# Patient Record
Sex: Female | Born: 1960
Health system: Southern US, Community
[De-identification: ages and names within clinical notes are randomized; demographics above are authoritative.]

## PROBLEM LIST (undated history)

## (undated) DIAGNOSIS — G4700 Insomnia, unspecified: Secondary | ICD-10-CM

## (undated) DIAGNOSIS — I493 Ventricular premature depolarization: Secondary | ICD-10-CM

## (undated) DIAGNOSIS — I8390 Asymptomatic varicose veins of unspecified lower extremity: Secondary | ICD-10-CM

## (undated) DIAGNOSIS — T8859XA Other complications of anesthesia, initial encounter: Secondary | ICD-10-CM

## (undated) DIAGNOSIS — I10 Essential (primary) hypertension: Secondary | ICD-10-CM

## (undated) DIAGNOSIS — F329 Major depressive disorder, single episode, unspecified: Secondary | ICD-10-CM

## (undated) DIAGNOSIS — K589 Irritable bowel syndrome without diarrhea: Secondary | ICD-10-CM

## (undated) DIAGNOSIS — F419 Anxiety disorder, unspecified: Secondary | ICD-10-CM

## (undated) DIAGNOSIS — T7840XA Allergy, unspecified, initial encounter: Secondary | ICD-10-CM

## (undated) DIAGNOSIS — B019 Varicella without complication: Secondary | ICD-10-CM

## (undated) DIAGNOSIS — R112 Nausea with vomiting, unspecified: Secondary | ICD-10-CM

## (undated) DIAGNOSIS — Z9889 Other specified postprocedural states: Secondary | ICD-10-CM

## (undated) DIAGNOSIS — F32A Depression, unspecified: Secondary | ICD-10-CM

## (undated) DIAGNOSIS — Z87442 Personal history of urinary calculi: Secondary | ICD-10-CM

## (undated) DIAGNOSIS — I499 Cardiac arrhythmia, unspecified: Secondary | ICD-10-CM

## (undated) DIAGNOSIS — T4145XA Adverse effect of unspecified anesthetic, initial encounter: Secondary | ICD-10-CM

## (undated) DIAGNOSIS — E782 Mixed hyperlipidemia: Secondary | ICD-10-CM

## (undated) DIAGNOSIS — N852 Hypertrophy of uterus: Secondary | ICD-10-CM

## (undated) DIAGNOSIS — N951 Menopausal and female climacteric states: Secondary | ICD-10-CM

## (undated) DIAGNOSIS — R002 Palpitations: Secondary | ICD-10-CM

## (undated) DIAGNOSIS — G629 Polyneuropathy, unspecified: Secondary | ICD-10-CM

## (undated) HISTORY — DX: Hypertrophy of uterus: N85.2

## (undated) HISTORY — DX: Menopausal and female climacteric states: N95.1

## (undated) HISTORY — DX: Irritable bowel syndrome, unspecified: K58.9

## (undated) HISTORY — DX: Essential (primary) hypertension: I10

## (undated) HISTORY — DX: Allergy, unspecified, initial encounter: T78.40XA

## (undated) HISTORY — DX: Personal history of urinary calculi: Z87.442

## (undated) HISTORY — PX: TONSILLECTOMY: SUR1361

## (undated) HISTORY — DX: Palpitations: R00.2

## (undated) HISTORY — PX: OTHER SURGICAL HISTORY: SHX169

## (undated) HISTORY — PX: COLONOSCOPY: SHX174

## (undated) HISTORY — DX: Polyneuropathy, unspecified: G62.9

## (undated) HISTORY — PX: ABDOMINAL HYSTERECTOMY: SHX81

## (undated) HISTORY — PX: HERNIA REPAIR: SHX51

## (undated) HISTORY — PX: LAPAROSCOPIC TOTAL HYSTERECTOMY: SUR800

## (undated) HISTORY — PX: VARICOSE VEIN SURGERY: SHX832

## (undated) HISTORY — DX: Mixed hyperlipidemia: E78.2

## (undated) HISTORY — PX: BREAST ENHANCEMENT SURGERY: SHX7

---

## 2011-11-18 HISTORY — PX: ESOPHAGOGASTRODUODENOSCOPY: SHX1529

## 2012-02-23 ENCOUNTER — Ambulatory Visit: Payer: Self-pay | Admitting: Gastroenterology

## 2012-02-26 LAB — PATHOLOGY REPORT

## 2012-10-13 ENCOUNTER — Ambulatory Visit: Payer: Self-pay | Admitting: Physical Medicine and Rehabilitation

## 2013-10-05 ENCOUNTER — Ambulatory Visit: Payer: Self-pay | Admitting: Internal Medicine

## 2013-10-19 ENCOUNTER — Ambulatory Visit: Payer: Self-pay | Admitting: Internal Medicine

## 2014-03-10 ENCOUNTER — Emergency Department: Payer: Self-pay | Admitting: Emergency Medicine

## 2014-03-10 LAB — CBC WITH DIFFERENTIAL/PLATELET
BASOS PCT: 0.7 %
Basophil #: 0 10*3/uL (ref 0.0–0.1)
EOS ABS: 0.3 10*3/uL (ref 0.0–0.7)
EOS PCT: 5.4 %
HCT: 36.2 % (ref 35.0–47.0)
HGB: 12.3 g/dL (ref 12.0–16.0)
Lymphocyte #: 1.5 10*3/uL (ref 1.0–3.6)
Lymphocyte %: 26 %
MCH: 31.1 pg (ref 26.0–34.0)
MCHC: 34 g/dL (ref 32.0–36.0)
MCV: 92 fL (ref 80–100)
MONO ABS: 0.4 x10 3/mm (ref 0.2–0.9)
Monocyte %: 6 %
NEUTROS ABS: 3.6 10*3/uL (ref 1.4–6.5)
Neutrophil %: 61.9 %
PLATELETS: 193 10*3/uL (ref 150–440)
RBC: 3.95 10*6/uL (ref 3.80–5.20)
RDW: 12.6 % (ref 11.5–14.5)
WBC: 5.9 10*3/uL (ref 3.6–11.0)

## 2014-03-10 LAB — URINALYSIS, COMPLETE
BILIRUBIN, UR: NEGATIVE
Bacteria: NONE SEEN
Blood: NEGATIVE
Glucose,UR: NEGATIVE mg/dL (ref 0–75)
KETONE: NEGATIVE
LEUKOCYTE ESTERASE: NEGATIVE
Nitrite: NEGATIVE
Ph: 7 (ref 4.5–8.0)
Protein: NEGATIVE
RBC,UR: NONE SEEN /HPF (ref 0–5)
SPECIFIC GRAVITY: 1.019 (ref 1.003–1.030)
WBC UR: <1 /HPF (ref 0–5)

## 2014-03-10 LAB — COMPREHENSIVE METABOLIC PANEL
ALBUMIN: 3.5 g/dL (ref 3.4–5.0)
ALK PHOS: 44 U/L — AB
AST: 30 U/L (ref 15–37)
Anion Gap: 7 (ref 7–16)
BILIRUBIN TOTAL: 0.3 mg/dL (ref 0.2–1.0)
BUN: 13 mg/dL (ref 7–18)
CALCIUM: 8.6 mg/dL (ref 8.5–10.1)
CREATININE: 0.65 mg/dL (ref 0.60–1.30)
Chloride: 106 mmol/L (ref 98–107)
Co2: 26 mmol/L (ref 21–32)
EGFR (African American): >60
EGFR (Non-African Amer.): >60
Glucose: 101 mg/dL — ABNORMAL HIGH (ref 65–99)
OSMOLALITY: 278 (ref 275–301)
POTASSIUM: 3.9 mmol/L (ref 3.5–5.1)
SGPT (ALT): 21 U/L (ref 12–78)
Sodium: 139 mmol/L (ref 136–145)
Total Protein: 6.9 g/dL (ref 6.4–8.2)

## 2014-03-10 LAB — GC/CHLAMYDIA PROBE AMP

## 2014-03-10 LAB — WET PREP, GENITAL

## 2014-03-10 LAB — PREGNANCY, URINE: Pregnancy Test, Urine: NEGATIVE m[IU]/mL

## 2014-06-27 DIAGNOSIS — G4700 Insomnia, unspecified: Secondary | ICD-10-CM | POA: Insufficient documentation

## 2014-06-27 DIAGNOSIS — I493 Ventricular premature depolarization: Secondary | ICD-10-CM | POA: Insufficient documentation

## 2014-06-27 DIAGNOSIS — M5116 Intervertebral disc disorders with radiculopathy, lumbar region: Secondary | ICD-10-CM | POA: Insufficient documentation

## 2014-06-27 DIAGNOSIS — I8390 Asymptomatic varicose veins of unspecified lower extremity: Secondary | ICD-10-CM | POA: Insufficient documentation

## 2014-06-27 DIAGNOSIS — K581 Irritable bowel syndrome with constipation: Secondary | ICD-10-CM | POA: Insufficient documentation

## 2014-08-15 ENCOUNTER — Encounter (INDEPENDENT_AMBULATORY_CARE_PROVIDER_SITE_OTHER): Payer: Self-pay

## 2014-08-15 ENCOUNTER — Ambulatory Visit (INDEPENDENT_AMBULATORY_CARE_PROVIDER_SITE_OTHER): Payer: 59 | Admitting: Cardiovascular Disease

## 2014-08-15 ENCOUNTER — Encounter: Payer: Self-pay | Admitting: Cardiovascular Disease

## 2014-08-15 VITALS — BP 132/88 | HR 83 | Ht 62.0 in | Wt 114.5 lb

## 2014-08-15 DIAGNOSIS — R0789 Other chest pain: Secondary | ICD-10-CM | POA: Insufficient documentation

## 2014-08-15 DIAGNOSIS — R079 Chest pain, unspecified: Secondary | ICD-10-CM

## 2014-08-15 DIAGNOSIS — F411 Generalized anxiety disorder: Secondary | ICD-10-CM

## 2014-08-15 DIAGNOSIS — F419 Anxiety disorder, unspecified: Secondary | ICD-10-CM

## 2014-08-15 DIAGNOSIS — I10 Essential (primary) hypertension: Secondary | ICD-10-CM | POA: Insufficient documentation

## 2014-08-15 DIAGNOSIS — R002 Palpitations: Secondary | ICD-10-CM

## 2014-08-15 DIAGNOSIS — R0602 Shortness of breath: Secondary | ICD-10-CM

## 2014-08-15 DIAGNOSIS — Z8739 Personal history of other diseases of the musculoskeletal system and connective tissue: Secondary | ICD-10-CM

## 2014-08-15 DIAGNOSIS — Z87898 Personal history of other specified conditions: Secondary | ICD-10-CM

## 2014-08-15 MED ORDER — METOPROLOL SUCCINATE ER 50 MG PO TB24: 50.0000 mg | ORAL_TABLET | Freq: Every day | ORAL | Status: DC

## 2014-08-15 NOTE — Assessment & Plan Note (Signed)
Palpitations likely APCs or PVCs secondary to stress. Recommend she increase her metoprolol to 50 mg daily as tolerated

## 2014-08-15 NOTE — Assessment & Plan Note (Signed)
Atypical chest pain, 2 episodes at rest, associated with anxiety. We have offered a routine treadmill study. She will try to restart her walking and monitor her symptoms for now. We have recommended that she call us for recurrent symptoms. She does not want nitroglycerin. We did discuss the possibility of coronary spasm or GI issues

## 2014-08-15 NOTE — Progress Notes (Signed)
Patient ID: Carla Green, female    DOB: 02-Sep-1961, 53 y.o.   MRN: [id number]  HPI Comments: [name] is a very pleasant 53 year old woman, patient of Dr. Army Melia, who presents for evaluation of hypertension, chest pain.  She reports having a long history of burning in her legs. Her symptoms seem to flareup more when she walks. She reports after a busy day at work, she is more symptomatic. She has tried Neurontin with no benefit.  She does have significant stressors over the past several months. Mother passed away in 12/02/13. She helps to take care of her elderly father. Also has in-laws that she helps to take care of. She has some marital issues, also other stress at home, possibly children. This has caused significant anxiety for her She has started working night shifts for the past 2-3 months. Since then she has had worsening episodes of palpitations. She has noticed periodic elevation of her blood pressure, sometimes up to 160, commonly at nighttime  She's had 2 episodes of back pain lasting 1 minute or less. Chest pain radiated through to her back. One episode occurred at rest while in church. She was called on to answer a question and she thinks this could have been anxiety. 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 Denies having any chest pain with exertion otherwise is very active. She use to walk on a regular basis, has not been doing so in several months as she is very busy.  She has been taking low-dose metoprolol for symptoms of palpitations. No significant relief on low-dose. She did increase the dose up to 50 mg daily and this seemed to help her symptoms  EKG shows normal sinus rhythm with rate 83 beats per minute, no significant ST or T wave changes  Previous stress echo at [facility] March 2011 which was normal. She exercised for 9 minutes, peak heart rate 169 beats per minute, normal  echo at rest and stress   Outpatient Encounter Prescriptions as of 08/15/2014  Medication Sig  . ALPRAZolam (XANAX) 0.5 MG tablet Take 0.25 mg by mouth at bedtime as needed.   . metoprolol succinate (TOPROL-XL) 50 MG 24 hr tablet Take 1 tablet (50 mg total) by mouth daily.  . valACYclovir (VALTREX) 500 MG tablet Take 500 mg by mouth as needed.     Review of Systems  Constitutional: Negative.   HENT: Negative.   Eyes: Negative.   Respiratory: Negative.   Cardiovascular: Negative.   Gastrointestinal: Negative.   Endocrine: Negative.   Musculoskeletal: Negative.   Skin: Negative.   Allergic/Immunologic: Negative.   Neurological: Negative.   Hematological: Negative.   Psychiatric/Behavioral: Negative.   All other systems reviewed and are negative.   BP 132/88  Pulse 83  Ht 5\' 2"  (1.575 m)  Wt 114 lb 8 oz (51.937 kg)  BMI 20.94 kg/m2  Physical Exam  Nursing note and vitals reviewed. Constitutional: She is oriented to person, place, and time. She appears well-developed and well-nourished.  HENT:  Head: Normocephalic.  Nose: Nose normal.  Mouth/Throat: Oropharynx is clear and moist.  Eyes: Conjunctivae are normal. Pupils are equal, round, and reactive to light.  Neck: Normal range of motion. Neck supple. No JVD present.  Cardiovascular: Normal rate, regular rhythm, S1 normal, S2 normal, normal heart sounds and intact distal pulses.  Exam reveals no gallop and no friction rub.   No murmur  heard. Pulmonary/Chest: Effort normal and breath sounds normal. No respiratory distress. She has no wheezes. She has no rales. She exhibits no tenderness.  Abdominal: Soft. Bowel sounds are normal. She exhibits no distension. There is no tenderness.  Musculoskeletal: Normal range of motion. She exhibits no edema and no tenderness.  Lymphadenopathy:    She has no cervical adenopathy.  Neurological: She is alert and oriented to person, place, and time. Coordination normal.  Skin: Skin is warm  and dry. No rash noted. No erythema.  Psychiatric: She has a normal mood and affect. Her behavior is normal. Judgment and thought content normal.    Assessment and Plan

## 2014-08-15 NOTE — Patient Instructions (Addendum)
Please increase the metoprolol as needed up to 50 mg a day for palpitations  Please call if you have more chest pain  Please call us if you have new issues that need to be addressed before your next appt.

## 2014-08-15 NOTE — Assessment & Plan Note (Addendum)
Etiology is not clear. Based on clinical exam, she has good extremity pulses, no signs of chronic venous insufficiency. Symptoms likely from neuropathy or nerve related. Suggested she talk with primary care about alternate medications. Perhaps Cymbalta or lyrica  might work

## 2014-08-15 NOTE — Assessment & Plan Note (Signed)
By all accounts, she has significant stress at home as detailed. I suspect this is causing her blood pressure to climb, heart rate to act more erratically with symptomatic palpitations. Again encouraged her to increase the metoprolol

## 2014-08-15 NOTE — Assessment & Plan Note (Signed)
Blood pressure well controlled on today's visit. We have recommended she could increase her metoprolol to 50 mg daily. This would likely help with blood pressure and her palpitations. I suspect her symptoms are secondary to anxiety/stress. Medication could be weaned as tolerated

## 2014-12-25 ENCOUNTER — Telehealth: Payer: Self-pay | Admitting: *Deleted

## 2014-12-25 NOTE — Telephone Encounter (Signed)
Pt states that palliations  are bad that its not letting patient sleep,  About three weeks this has been going on, and this is after she started the medications. Seemed like it worked for a little while.  If she's moving around she's fine, it is just when she try's to lay down, she is more restless at night.   Please advise.

## 2014-12-25 NOTE — Telephone Encounter (Signed)
Spoke w/ pt.  She reports that her episodes of palpitations have been increasing and she would like to be evaluated. Offered pt appt to see Christell Faith, PA this afternoon, but she declines, as she would prefer an appt after 4:00 or on Friday am.  Pt sched to see Dr. Rockey Situ on 12/29/14 @ 9:15. Asked her to call back w/ any further questions or concerns before that time.

## 2014-12-28 DIAGNOSIS — R002 Palpitations: Secondary | ICD-10-CM | POA: Diagnosis not present

## 2014-12-29 ENCOUNTER — Ambulatory Visit (INDEPENDENT_AMBULATORY_CARE_PROVIDER_SITE_OTHER): Payer: 59 | Admitting: Cardiovascular Disease

## 2014-12-29 ENCOUNTER — Encounter: Payer: Self-pay | Admitting: Cardiovascular Disease

## 2014-12-29 VITALS — BP 122/68 | HR 72 | Ht 63.0 in | Wt 120.0 lb

## 2014-12-29 DIAGNOSIS — F419 Anxiety disorder, unspecified: Secondary | ICD-10-CM

## 2014-12-29 DIAGNOSIS — R079 Chest pain, unspecified: Secondary | ICD-10-CM

## 2014-12-29 DIAGNOSIS — R002 Palpitations: Secondary | ICD-10-CM

## 2014-12-29 DIAGNOSIS — I10 Essential (primary) hypertension: Secondary | ICD-10-CM

## 2014-12-29 NOTE — Progress Notes (Signed)
Patient ID: Carla Green, female    DOB: July 03, 1961, 54 y.o.   MRN: [id number]  HPI Comments: Carla Green is a very pleasant 54 year old woman, patient of Dr. Army Melia, who presents for evaluation of palpitations.  In follow-up today, she reports that she continues to have problems with Anxiety. She thinks that she needs estrogen supplement and that her symptoms are from menopause Taking metoprolol daily 2 to 3 weeks consistently On zoloft 2 to 3 weeks. Feels better Insomnia still a problem. Palpitations more at nighttime. She is listening with a stethoscope She is interested in wearing a monitor, concerned about atrial fibrillation  EKG on today's visit shows normal sinus rhythm with rate 72 bpm, no significant ST or T-wave changes Lab work from primary care reviewed hemoglobin A1c 5.2, creatinine 0.73, hematocrit 39, TSH 1.0, total cholesterol 245, LDL 142 EKG from primary care August 2015 showing no ectopy otherwise normal EKG  Other past medical history  long history of burning in her legs. Her symptoms seem to flareup more when she walks. She reports after a busy day at work, she is more symptomatic. She has tried Neurontin with no benefit.  She does have significant stressors over the past several months. Mother passed away in January 02, 2014. She helps to take care of her elderly father. Also has in-laws that she helps to take care of. She has some marital issues, also other stress at home, possibly children. This has caused significant anxiety for her She has started working night shifts   Previous stress echo at [facility] March 2011 which was normal. She exercised for 9 minutes, peak heart rate 169 beats per minute, normal echo at rest and stress   Allergies  Allergen Reactions  . Codeine Sulfate Itching    Outpatient Encounter Prescriptions as of 12/29/2014  Medication Sig  . ALPRAZolam (XANAX) 0.5 MG tablet Take 0.25 mg by mouth at bedtime as needed.   Marland Kitchen ibuprofen  (ADVIL,MOTRIN) 800 MG tablet Take 800 mg by mouth every 6 (six) hours as needed.   . metoprolol succinate (TOPROL-XL) 50 MG 24 hr tablet Take 1 tablet (50 mg total) by mouth daily.  . valACYclovir (VALTREX) 500 MG tablet Take 500 mg by mouth as needed.   . sertraline (ZOLOFT) 50 MG tablet Take 50 mg by mouth daily.    Past Medical History  Diagnosis Date  . IBS (irritable bowel syndrome)   . Menopausal state   . Neuropathy   . Mixed hyperlipidemia   . History of kidney stones   . Palpitations     History reviewed. No pertinent past surgical history.  Social History  reports that she has never smoked. She does not have any smokeless tobacco history on file. She reports that she does not drink alcohol or use illicit drugs.  Family History family history includes Heart attack in her father; Heart attack (age of onset: 22) in her paternal grandmother; Heart attack (age of onset: 35) in her paternal uncle; Heart disease in her father and paternal uncle.   Review of Systems  Constitutional: Negative.   Respiratory: Negative.   Cardiovascular: Positive for palpitations.  Gastrointestinal: Negative.   Musculoskeletal: Negative.   Skin: Negative.   Neurological: Negative.   Hematological: Negative.   Psychiatric/Behavioral: Negative.   All other systems reviewed and are negative.   BP 122/68 mmHg  Pulse 72  Ht 5\' 3"  (1.6 m)  Wt 120 lb (54.432 kg)  BMI 21.26 kg/m2  Physical Exam  Constitutional:  She is oriented to person, place, and time. She appears well-developed and well-nourished.  HENT:  Head: Normocephalic.  Nose: Nose normal.  Mouth/Throat: Oropharynx is clear and moist.  Eyes: Conjunctivae are normal. Pupils are equal, round, and reactive to light.  Neck: Normal range of motion. Neck supple. No JVD present.  Cardiovascular: Normal rate, regular rhythm, S1 normal, S2 normal, normal heart sounds and intact distal pulses.  Exam reveals no gallop and no friction rub.    No murmur heard. Pulmonary/Chest: Effort normal and breath sounds normal. No respiratory distress. She has no wheezes. She has no rales. She exhibits no tenderness.  Abdominal: Soft. Bowel sounds are normal. She exhibits no distension. There is no tenderness.  Musculoskeletal: Normal range of motion. She exhibits no edema or tenderness.  Lymphadenopathy:    She has no cervical adenopathy.  Neurological: She is alert and oriented to person, place, and time. Coordination normal.  Skin: Skin is warm and dry. No rash noted. No erythema.  Psychiatric: She has a normal mood and affect. Her behavior is normal. Judgment and thought content normal.    Assessment and Plan  Nursing note and vitals reviewed.

## 2014-12-29 NOTE — Patient Instructions (Addendum)
You are doing well. No medication changes were made.  We will order a 48 hr monitor for palpitations  Please call us if you have new issues that need to be addressed before your next appt.  Your physician wants you to follow-up in: 6 months.  You will receive a reminder letter in the mail two months in advance. If you don't receive a letter, please call our office to schedule the follow-up appointment.

## 2014-12-29 NOTE — Assessment & Plan Note (Signed)
Anxiety improving on her Zoloft. Still with symptoms

## 2014-12-29 NOTE — Assessment & Plan Note (Signed)
We have suggested that she stay on her metoprolol succinate 50 mg in the evenings. This will also help with palpitations

## 2014-12-29 NOTE — Assessment & Plan Note (Signed)
I suspect she is having APCs or PVCs. Holter monitor has been ordered. She is concerned about other rhythm such as atrial fibrillation. Less likely given her description

## 2015-01-04 ENCOUNTER — Telehealth: Payer: Self-pay

## 2015-01-04 NOTE — Telephone Encounter (Signed)
Have not received results yet.

## 2015-01-04 NOTE — Telephone Encounter (Signed)
Pt would like holter results.

## 2015-01-09 ENCOUNTER — Telehealth: Payer: Self-pay | Admitting: *Deleted

## 2015-01-09 NOTE — Telephone Encounter (Signed)
Please call patient with heart monitor results. Thanks

## 2015-01-09 NOTE — Telephone Encounter (Signed)
Results are still pending from [place].

## 2015-01-18 ENCOUNTER — Ambulatory Visit: Payer: Self-pay | Admitting: Internal Medicine

## 2015-01-18 ENCOUNTER — Telehealth: Payer: Self-pay

## 2015-01-18 NOTE — Telephone Encounter (Signed)
Pt would like holter results.

## 2015-01-22 NOTE — Telephone Encounter (Signed)
Reviewed results w/ pt.  She verbalizes relief that she has not been having afib and will call back w/ any further questions or concerns.

## 2015-01-22 NOTE — Telephone Encounter (Signed)
Pt calling about results

## 2015-01-23 ENCOUNTER — Ambulatory Visit (INDEPENDENT_AMBULATORY_CARE_PROVIDER_SITE_OTHER): Payer: 59

## 2015-01-23 ENCOUNTER — Other Ambulatory Visit: Payer: Self-pay

## 2015-01-23 DIAGNOSIS — R002 Palpitations: Secondary | ICD-10-CM

## 2015-01-23 DIAGNOSIS — R079 Chest pain, unspecified: Secondary | ICD-10-CM

## 2015-02-26 ENCOUNTER — Encounter: Payer: Self-pay | Admitting: *Deleted

## 2015-05-01 ENCOUNTER — Other Ambulatory Visit: Payer: Self-pay | Admitting: Obstetrics and Gynecology

## 2015-05-01 NOTE — H&P (Signed)
[name] is a 54 y.o. female here for Ovarian Cyst and Fibroids . Pain in pelvis, worsens with stress, radiates down leg. Leg weakness that started this month. Pelvic pressure and now sticky clear discharge.  LMP: April 2015 PMB: May 2016 7 days of period-like bleeding  U/s in 10/2014 with 9mm stripe U/s in 03/2015 with 12 mm stripe and not-concerning ovarian cysts.  One post fibroid seen=24.4 mm Endometrium= 11.65 mm Abn for post menopause  Rt simple ov cyst=1.29 cm Three simple cyst lt ov 1=1.43 cm 2=1.06 cm 3=0.77 cm  EMBx in office unable to be completed due to stenotic cervix.  Past Medical History:  has a past medical history of PVC (premature ventricular contraction); Insomnia; Varicose veins; IBS (irritable bowel syndrome); Chickenpox; and Gestational diabetes mellitus.  Past Surgical History:  has past surgical history that includes Femoral hernia repair; Tonsillectomy; Laser surgery for varicose veins; Colonoscopy (02/23/2012); and egd (02/23/2012). Family History: family history includes Colon cancer in her father; Diabetes mellitus in her father; Diabetes type II in her father; Heart attack in her father; Hypertension in her other; Mental illness in her brother; Osteoporosis in her other; Rheum arthritis in her other. Social History:  reports that she has never smoked. She does not have any smokeless tobacco history on file. She reports that she does not drink alcohol or use illicit drugs. OB/GYN History:  OB History    Gravida Para Term Preterm AB TAB SAB Ectopic Multiple Living   4 3 3  1  1   3       Allergies: is allergic to codeine sulfate. Medications:  Current outpatient prescriptions:  . ALPRAZolam (XANAX) 0.5 MG tablet, , Disp: , Rfl:  . fluocinonide-emollient 0.05 % Crea, , Disp: , Rfl:  . ibuprofen (ADVIL,MOTRIN) 800 MG tablet, Take 800 mg by mouth 3 (three) times daily as needed for Pain., Disp: , Rfl:  . sertraline  (ZOLOFT) 25 MG tablet, Take 25 mg by mouth once daily., Disp: , Rfl:  . valacyclovir (VALTREX) 500 MG tablet, , Disp: , Rfl:  . conjugated estrogens (PREMARIN) 0.625 mg/gram vaginal cream, Place 0.5 g vaginally twice a week. 1 applicator twice weekly, Disp: 42.5 g, Rfl: 3 . ergocalciferol (DRISDOL) 50,000 unit capsule, , Disp: , Rfl:  . gabapentin (NEURONTIN) 300 MG capsule, 1 po qHS x 4 days, then bid thereafter, Disp: 60 capsule, Rfl: 2 . levofloxacin (LEVAQUIN) 500 MG tablet, , Disp: , Rfl:  . misoprostol (CYTOTEC) 200 MCG tablet, Take 1 tablet (200 mcg total) by mouth once. Take the tablet at bedtime the night before the procedure, Disp: 1 tablet, Rfl: 0  Review of Systems: See HPI   Exam:   Filed Vitals:   04/27/15 1052  BP: 113/66  Pulse: 82    WDWN white female in NAD Body mass index is 21.94 kg/(m^2). Lungs: CTA  CV : RRR without murmur  Neck: no thyromegaly Abdomen: soft , no mass, normal active bowel sounds, non-tender, no rebound tenderness  Pelvic: tanner stage 5 ,  External genitalia: vulva /labia no lesions Urethra: no prolapse Vagina: normal physiologic d/c Cervix: no lesions, no cervical motion tenderness  Uterus: normal size shape and contour, non-tender Adnexa: no mass, non-tender  Rectovaginal: external exam normal  Impression:   The primary encounter diagnosis was Postmenopausal bleeding. A diagnosis of Endometrial thickening on ultra sound was also pertinent to this visit.    Plan:   - Postmenopausal bleeding with Thickened endometrial stripe: Failed inoffice EMBx. Plan for  D&C hysteroscopy. Cytotec 200mg  po the night before the procedure - script given for same. R/B/A discussed with patient.  As a next step, She is interested in hysterectomy for the pelvic pressure with BSO for fhx of ovarian cancer. -  Sherrie George, MD

## 2015-05-03 ENCOUNTER — Encounter
Admission: RE | Admit: 2015-05-03 | Discharge: 2015-05-03 | Disposition: A | Discharge status: Home / Self Care | Payer: 59 | Source: Ambulatory Visit | Attending: Obstetrics and Gynecology | Admitting: Obstetrics and Gynecology

## 2015-05-03 DIAGNOSIS — Z79899 Other long term (current) drug therapy: Secondary | ICD-10-CM | POA: Diagnosis not present

## 2015-05-03 DIAGNOSIS — Z01812 Encounter for preprocedural laboratory examination: Secondary | ICD-10-CM | POA: Diagnosis present

## 2015-05-03 DIAGNOSIS — D279 Benign neoplasm of unspecified ovary: Secondary | ICD-10-CM | POA: Diagnosis not present

## 2015-05-03 HISTORY — DX: Adverse effect of unspecified anesthetic, initial encounter: T41.45XA

## 2015-05-03 HISTORY — DX: Cardiac arrhythmia, unspecified: I49.9

## 2015-05-03 HISTORY — DX: Other specified postprocedural states: Z98.890

## 2015-05-03 HISTORY — DX: Nausea with vomiting, unspecified: R11.2

## 2015-05-03 HISTORY — DX: Other complications of anesthesia, initial encounter: T88.59XA

## 2015-05-03 LAB — TYPE AND SCREEN
ABO/RH(D): A POS
Antibody Screen: NEGATIVE

## 2015-05-03 LAB — CBC
HEMATOCRIT: 38.7 % (ref 35.0–47.0)
Hemoglobin: 12.9 g/dL (ref 12.0–16.0)
MCH: 31.1 pg (ref 26.0–34.0)
MCHC: 33.3 g/dL (ref 32.0–36.0)
MCV: 93.3 fL (ref 80.0–100.0)
Platelets: 181 10*3/uL (ref 150–440)
RBC: 4.15 MIL/uL (ref 3.80–5.20)
RDW: 13.2 % (ref 11.5–14.5)
WBC: 5.6 10*3/uL (ref 3.6–11.0)

## 2015-05-03 LAB — BASIC METABOLIC PANEL
ANION GAP: 3 — AB (ref 5–15)
BUN: 11 mg/dL (ref 6–20)
CALCIUM: 9 mg/dL (ref 8.9–10.3)
CHLORIDE: 105 mmol/L (ref 101–111)
CO2: 31 mmol/L (ref 22–32)
Creatinine, Ser: 0.6 mg/dL (ref 0.44–1.00)
GFR calc non Af Amer: >60 mL/min (ref >60)
Glucose, Bld: 104 mg/dL — ABNORMAL HIGH (ref 65–99)
Potassium: 3.7 mmol/L (ref 3.5–5.1)
Sodium: 139 mmol/L (ref 135–145)

## 2015-05-03 LAB — ABO/RH: ABO/RH(D): A POS

## 2015-05-03 NOTE — OR Nursing (Signed)
Ekg/holter 2/16 by Dr Rockey Situ in epic results review

## 2015-05-03 NOTE — Patient Instructions (Signed)
  Your procedure is scheduled on: 05/07/15 Report to Day Surgery. To find out your arrival time please call [phone] between 1PM - 3PM on 05/04/15.  Remember: Instructions that are not followed completely may result in serious medical risk, up to and including death, or upon the discretion of your surgeon and anesthesiologist your surgery may need to be rescheduled.    _x_ 1. Do not eat food or drink liquids after midnight. No gum chewing or hard candies.     __x__ 2. No Alcohol for 24 hours before or after surgery.   ____ 3. Bring all medications with you on the day of surgery if instructed.    _x___ 4. Notify your doctor if there is any change in your medical condition     (cold, fever, infections).     Do not wear jewelry, make-up, hairpins, clips or nail polish.  Do not wear lotions, powders, or perfumes. You may wear deodorant.  Do not shave 48 hours prior to surgery. Men may shave face and neck.  Do not bring valuables to the hospital.    [facility] is not responsible for any belongings or valuables.               Contacts, dentures or bridgework may not be worn into surgery.  Leave your suitcase in the car. After surgery it may be brought to your room.  For patients admitted to the hospital, discharge time is determined by your                treatment team.   Patients discharged the day of surgery will not be allowed to drive home.   Please read over the following fact sheets that you were given:   Surgical Site Infection Prevention   ____ Take these medicines the morning of surgery with A SIP OF WATER:    1.   2.   3.   4.  5.  6.  ____ Fleet Enema (as directed)   ____ Use CHG Soap as directed  ____ Use inhalers on the day of surgery  ____ Stop metformin 2 days prior to surgery    ____ Take 1/2 of usual insulin dose the night before surgery and none on the morning of surgery.   ____ Stop Coumadin/Plavix/aspirin on   ____ Stop Anti-inflammatories  on   ____ Stop supplements until after surgery.    ____ Bring C-Pap to the hospital.

## 2015-05-07 ENCOUNTER — Encounter
Admission: RE | Disposition: A | Discharge status: Home / Self Care | Payer: Self-pay | Source: Ambulatory Visit | Attending: Obstetrics and Gynecology

## 2015-05-07 ENCOUNTER — Ambulatory Visit
Admission: RE | Admit: 2015-05-07 | Discharge: 2015-05-07 | Disposition: A | Discharge status: Home / Self Care | Payer: 59 | Source: Ambulatory Visit | Attending: Obstetrics and Gynecology | Admitting: Obstetrics and Gynecology

## 2015-05-07 ENCOUNTER — Encounter: Payer: Self-pay | Admitting: *Deleted

## 2015-05-07 ENCOUNTER — Ambulatory Visit: Payer: 59 | Admitting: Anesthesiology

## 2015-05-07 DIAGNOSIS — N840 Polyp of corpus uteri: Secondary | ICD-10-CM | POA: Insufficient documentation

## 2015-05-07 DIAGNOSIS — R938 Abnormal findings on diagnostic imaging of other specified body structures: Secondary | ICD-10-CM | POA: Diagnosis not present

## 2015-05-07 DIAGNOSIS — Z885 Allergy status to narcotic agent status: Secondary | ICD-10-CM | POA: Diagnosis not present

## 2015-05-07 DIAGNOSIS — Z79890 Hormone replacement therapy (postmenopausal): Secondary | ICD-10-CM | POA: Insufficient documentation

## 2015-05-07 DIAGNOSIS — N950 Postmenopausal bleeding: Secondary | ICD-10-CM | POA: Insufficient documentation

## 2015-05-07 DIAGNOSIS — Z79899 Other long term (current) drug therapy: Secondary | ICD-10-CM | POA: Insufficient documentation

## 2015-05-07 HISTORY — PX: HYSTEROSCOPY WITH D & C: SHX1775

## 2015-05-07 SURGERY — DILATATION AND CURETTAGE /HYSTEROSCOPY
Anesthesia: General

## 2015-05-07 MED ORDER — ONDANSETRON HCL 4 MG/2ML IJ SOLN
INTRAMUSCULAR | Status: DC | PRN
  Administered 2015-05-07: 4 mg via INTRAVENOUS

## 2015-05-07 MED ORDER — LACTATED RINGERS IV SOLN: INTRAVENOUS | Status: DC

## 2015-05-07 MED ORDER — KETOROLAC TROMETHAMINE 30 MG/ML IJ SOLN
INTRAMUSCULAR | Status: DC | PRN
  Administered 2015-05-07: 30 mg via INTRAVENOUS

## 2015-05-07 MED ORDER — IBUPROFEN 800 MG PO TABS: 800.0000 mg | ORAL_TABLET | Freq: Three times a day (TID) | ORAL | Status: DC | PRN

## 2015-05-07 MED ORDER — FAMOTIDINE 20 MG PO TABS
20.0000 mg | ORAL_TABLET | Freq: Once | ORAL | Status: AC
  Administered 2015-05-07: 20 mg via ORAL

## 2015-05-07 MED ORDER — SCOPOLAMINE 1 MG/3DAYS TD PT72
1.0000 | MEDICATED_PATCH | Freq: Once | TRANSDERMAL | Status: DC
  Administered 2015-05-07: 1.5 mg via TRANSDERMAL

## 2015-05-07 MED ORDER — SCOPOLAMINE 1 MG/3DAYS TD PT72
MEDICATED_PATCH | TRANSDERMAL | Status: AC
  Administered 2015-05-07: 1.5 mg via TRANSDERMAL
  Filled 2015-05-07: qty 1

## 2015-05-07 MED ORDER — FENTANYL CITRATE (PF) 100 MCG/2ML IJ SOLN
INTRAMUSCULAR | Status: DC | PRN
  Administered 2015-05-07: 100 ug via INTRAVENOUS

## 2015-05-07 MED ORDER — SILVER NITRATE-POT NITRATE 75-25 % EX MISC
CUTANEOUS | Status: DC | PRN
  Administered 2015-05-07: 1

## 2015-05-07 MED ORDER — FAMOTIDINE 20 MG PO TABS
ORAL_TABLET | ORAL | Status: AC
  Administered 2015-05-07: 20 mg via ORAL
  Filled 2015-05-07: qty 1

## 2015-05-07 MED ORDER — LIDOCAINE HCL (CARDIAC) 20 MG/ML IV SOLN
INTRAVENOUS | Status: DC | PRN
  Administered 2015-05-07: 100 mg via INTRAVENOUS

## 2015-05-07 MED ORDER — MIDAZOLAM HCL 2 MG/2ML IJ SOLN
INTRAMUSCULAR | Status: DC | PRN
  Administered 2015-05-07: 2 mg via INTRAVENOUS

## 2015-05-07 MED ORDER — DEXAMETHASONE SODIUM PHOSPHATE 4 MG/ML IJ SOLN
INTRAMUSCULAR | Status: DC | PRN
  Administered 2015-05-07: 10 mg via INTRAVENOUS

## 2015-05-07 MED ORDER — LACTATED RINGERS IV SOLN
INTRAVENOUS | Status: DC
  Administered 2015-05-07 (×2): via INTRAVENOUS

## 2015-05-07 MED ORDER — PROPOFOL 10 MG/ML IV BOLUS
INTRAVENOUS | Status: DC | PRN
  Administered 2015-05-07: 150 mg via INTRAVENOUS

## 2015-05-07 MED ORDER — SILVER NITRATE-POT NITRATE 75-25 % EX MISC
CUTANEOUS | Status: AC
  Filled 2015-05-07: qty 5

## 2015-05-07 MED ORDER — SILVER NITRATE-POT NITRATE 75-25 % EX MISC
CUTANEOUS | Status: AC
  Filled 2015-05-07: qty 2

## 2015-05-07 SURGICAL SUPPLY — 18 items
CANISTER SUCT 3000ML (MISCELLANEOUS) ×3 IMPLANT
CATH ROBINSON RED A/P 16FR (CATHETERS) ×3 IMPLANT
CORD URO TURP 10FT (MISCELLANEOUS) ×3 IMPLANT
ELECT LOOP MED HF 24F 12D (CUTTING LOOP) IMPLANT
ELECT RESECT POWERBALL 24F (MISCELLANEOUS) IMPLANT
GLOVE BIO SURGEON STRL SZ 6.5 (GLOVE) ×2 IMPLANT
GLOVE BIO SURGEONS STRL SZ 6.5 (GLOVE) ×1
GLOVE INDICATOR 7.0 STRL GRN (GLOVE) ×3 IMPLANT
GOWN STRL REUS W/ TWL LRG LVL3 (GOWN DISPOSABLE) ×2 IMPLANT
GOWN STRL REUS W/TWL LRG LVL3 (GOWN DISPOSABLE) ×4
IV LACTATED RINGERS 1000ML (IV SOLUTION) ×3 IMPLANT
KIT RM TURNOVER CYSTO AR (KITS) ×3 IMPLANT
PACK DNC HYST (MISCELLANEOUS) ×3 IMPLANT
PAD GROUND ADULT SPLIT (MISCELLANEOUS) ×3 IMPLANT
PAD OB MATERNITY 4.3X12.25 (PERSONAL CARE ITEMS) ×3 IMPLANT
PAD PREP 24X41 OB/GYN DISP (PERSONAL CARE ITEMS) ×3 IMPLANT
TUBING CONNECTING 10 (TUBING) ×2 IMPLANT
TUBING CONNECTING 10' (TUBING) ×1

## 2015-05-07 NOTE — H&P (Signed)
  Date of Initial H&P: 03/27/15  History reviewed, patient examined, no change in status, stable for surgery.  Paper chart H&P completed and will be scanned into EMR.

## 2015-05-07 NOTE — Op Note (Signed)
Operative Report Hysteroscopy with Dilation and Curettage   Indications: Postmenopausal bleeding with a thickened endometrial stripe on ultrasound, and failed office endometrial biopsy   Pre-operative Diagnosis: Postmenopausal bleeding   Post-operative Diagnosis: same.  Procedure: 1. Exam under anesthesia 2. D&C 3. Hysteroscopy  Surgeon: Benjaman Kindler, MD  Assistant(s):  None  Anesthesia: General LMA anesthesia  Estimated Blood Loss:  Minimal         Intraoperative medications: Toradol and Tylenol for pain control         Total IV Fluids: 1121ml  Urine Output: 71ml         Specimens: Endocervical curettings, endometrial curettings         Complications:  None; patient tolerated the procedure well.         Disposition: PACU - hemodynamically stable.         Condition: stable  Findings: Uterus measuring 8.5 cm by sound; normal cervix, vagina, perineum.   Indication for procedure/Consents: 54 y.o. G4P3  here for scheduled surgery for the aforementioned diagnoses.  Risks of surgery were discussed with the patient including but not limited to: bleeding which may require transfusion; infection which may require antibiotics; injury to uterus or surrounding organs; intrauterine scarring which may impair future fertility; need for additional procedures including laparotomy or laparoscopy; and other postoperative/anesthesia complications. Written informed consent was obtained.    Procedure Details:   The patient was taken to the operating room where  anesthesia was administered and was found to be adequate. After a formal and adequate timeout was performed, she was placed in the dorsal lithotomy position and examined with the above findings. She was then prepped and draped in the sterile manner. Her bladder was catheterized for an estimated amount of clear, yellow urine. A weighed speculum was then placed in the patient's vagina and a single tooth tenaculum was applied to the  anterior lip of the cervix.  Her cervix was serially dilated to 15 [place] using Hanks dilators. An ECC was performed. The hysteroscope was introduced to reveal the above findings. A sharp curettage was then performed until there was a gritty texture in all four quadrants. The tenaculum was removed from the anterior lip of the cervix and the vaginal speculum was removed after applying silver nitrate for good hemostasis. The patient tolerated the procedure well and was taken to the recovery area awake and in stable condition. She received iv acetaminophen and Toradol prior to leaving the OR.  The patient will be discharged to home as per PACU criteria. Routine postoperative instructions given. She was prescribed Ibuprofen and Colace. She will follow up in the clinic in two weeks for postoperative evaluation.

## 2015-05-07 NOTE — Anesthesia Postprocedure Evaluation (Signed)
  Anesthesia Post-op Note  Patient: Carla Green  Procedure(s) Performed: Procedure(s): DILATATION AND CURETTAGE /HYSTEROSCOPY (N/A)  Anesthesia type:General  Patient location: PACU  Post pain: Pain level controlled  Post assessment: Post-op Vital signs reviewed, Patient's Cardiovascular Status Stable, Respiratory Function Stable, Patent Airway and No signs of Nausea or vomiting  Post vital signs: Reviewed and stable  Last Vitals:  Filed Vitals:   05/07/15 1225  BP: 127/79  Pulse:   Temp: 36.2 C  Resp:     Level of consciousness: awake, alert  and patient cooperative  Complications: No apparent anesthesia complications

## 2015-05-07 NOTE — Transfer of Care (Signed)
Immediate Anesthesia Transfer of Care Note  Patient: Carla Green  Procedure(s) Performed: Procedure(s): DILATATION AND CURETTAGE /HYSTEROSCOPY (N/A)  Patient Location: PACU  Anesthesia Type:General  Level of Consciousness: sedated  Airway & Oxygen Therapy: Patient connected to face mask oxygen  Post-op Assessment: Report given to RN  Post vital signs: Reviewed and stable  Last Vitals:  Filed Vitals:   05/07/15 0934  BP: 128/72  Pulse: 76  Temp: 36.7 C  Resp: 16    Complications: No apparent anesthesia complications

## 2015-05-07 NOTE — Anesthesia Preprocedure Evaluation (Signed)
Anesthesia Evaluation  Patient identified by MRN, date of birth, ID band Patient awake    Reviewed: Allergy & Precautions, NPO status , Patient's Chart, lab work & pertinent test results  History of Anesthesia Complications (+) PONV and history of anesthetic complications  Airway Mallampati: II  TM Distance: >3 FB Neck ROM: Full    Dental no notable dental hx.    Pulmonary neg pulmonary ROS,  breath sounds clear to auscultation  Pulmonary exam normal       Cardiovascular Exercise Tolerance: Good negative cardio ROS Normal cardiovascular examRhythm:Regular Rate:Normal     Neuro/Psych Anxiety negative neurological ROS     GI/Hepatic negative GI ROS, Neg liver ROS,   Endo/Other  negative endocrine ROS  Renal/GU negative Renal ROS  negative genitourinary   Musculoskeletal negative musculoskeletal ROS (+)   Abdominal   Peds negative pediatric ROS (+)  Hematology negative hematology ROS (+)   Anesthesia Other Findings   Reproductive/Obstetrics negative OB ROS                             Anesthesia Physical Anesthesia Plan  ASA: II  Anesthesia Plan: General   Post-op Pain Management:    Induction: Intravenous  Airway Management Planned: LMA  Additional Equipment:   Intra-op Plan:   Post-operative Plan: Extubation in OR  Informed Consent: I have reviewed the patients History and Physical, chart, labs and discussed the procedure including the risks, benefits and alternatives for the proposed anesthesia with the patient or authorized representative who has indicated his/her understanding and acceptance.   Dental advisory given  Plan Discussed with: CRNA and Surgeon  Anesthesia Plan Comments:         Anesthesia Quick Evaluation

## 2015-05-07 NOTE — Anesthesia Procedure Notes (Signed)
Procedure Name: LMA Insertion Date/Time: 05/07/2015 10:50 AM Performed by: Nelda Marseille Pre-anesthesia Checklist: Patient identified, Emergency Drugs available, Suction available, Patient being monitored and Timeout performed Patient Re-evaluated:Patient Re-evaluated prior to inductionOxygen Delivery Method: Circle system utilized Preoxygenation: Pre-oxygenation with 100% oxygen Intubation Type: IV induction Ventilation: Mask ventilation without difficulty LMA: LMA inserted LMA Size: 3.5 Tube type: Oral Number of attempts: 1 Placement Confirmation: positive ETCO2 and breath sounds checked- equal and bilateral Tube secured with: Tape Dental Injury: Teeth and Oropharynx as per pre-operative assessment

## 2015-05-07 NOTE — Discharge Instructions (Addendum)
Discharge instructions after a hysteroscopy with dilation and curettage  Signs and Symptoms to Report  Call our office at [phone] if you have any of the following:    Fever over 100.4 degrees or higher  Severe stomach pain not relieved with pain medications  Bright red bleeding thats heavier than a period that does not slow with rest after the first 24 hours  To go the bathroom a lot (frequency), you cant hold your urine (urgency), or it hurts when you empty your bladder (urinate)  Chest pain  Shortness of breath  Pain in the calves of your legs  Severe nausea and vomiting not relieved with anti-nausea medications  Any concerns  What You Can Expect after Surgery  You may see some pink tinged, bloody fluid. This is normal. You may also have cramping for several days.   Activities after Your Discharge Follow these guidelines to help speed your recovery at home:  Dont drive if you are in pain or taking narcotic pain medicine. You may drive when you can safely slam on the brakes, turn the wheel forcefully, and rotate your torso comfortably. This is typically 4-7 days. Practice in a parking lot or side street prior to attempting to drive regularly.   Ask others to help with household chores for 4 weeks.  Dont do strenuous activities, exercises, or sports like vacuuming, tennis, squash, etc. until your doctor says it is safe to do so.  Walk as you feel able. Rest often since it may take a week or two for your energy level to return to normal.   You may climb stairs  Avoid constipation:   -Eat fruits, vegetables, and whole grains. Eat small meals as your appetite will take time to return to normal.   -Drink 6 to 8 glasses of water each day unless your doctor has told you to limit your fluids.   -Use a laxative or stool softener as needed if constipation becomes a problem. You may take Miralax, metamucil, Citrucil, Colace, Senekot, FiberCon, etc. If this does not  relieve the constipation, try two tablespoons of Milk Of Magnesia every 8 hours until your bowels move.   You may shower.   Do not get in a hot tub, swimming pool, etc. until your doctor agrees.  Do not douche, use tampons, or have sex until your doctor says it is okay, usually about 2 weeks.  Take your pain medicine when you need it. The medicine may not work as well if the pain is bad.  Take the medicines you were taking before surgery. Other medications you might need are pain medications (ibuprofen), medications for constipation (Colace) and nausea medications (Zofran).     AMBULATORY SURGERY  DISCHARGE INSTRUCTIONS   1) The drugs that you were given will stay in your system until tomorrow so for the next 24 hours you should not:  A) Drive an automobile B) Make any legal decisions C) Drink any alcoholic beverage   2) You may resume regular meals tomorrow.  Today it is better to start with liquids and gradually work up to solid foods.  You may eat anything you prefer, but it is better to start with liquids, then soup and crackers, and gradually work up to solid foods.   3) Please notify your doctor immediately if you have any unusual bleeding, trouble breathing, redness and pain at the surgery site, drainage, fever, or pain not relieved by medication. 4)   5) Your post-operative visit with Dr.  Porfilio                                 is: Date:                        Time:    Please call to schedule your post-operative visit.  6) Additional Instructions: AMBULATORY SURGERY  DISCHARGE INSTRUCTIONS   The drugs that you were given will stay in your system until tomorrow so for the next 24 hours you should not:  Drive an automobile Make any legal decisions Drink any alcoholic beverage   You may resume regular meals tomorrow.  Today it is better to start with liquids and gradually work up to solid foods.  You may eat anything you prefer, but it is better to start  with liquids, then soup and crackers, and gradually work up to solid foods.   Please notify your doctor immediately if you have any unusual bleeding, trouble breathing, redness and pain at the surgery site, drainage, fever, or pain not relieved by medication.   Your post-operative visit with Dr.    George Ina                                 is: Date:                        Time:    Please call to schedule your post-operative visit.  Additional Instructions: Dilation and Curettage or Vacuum Curettage, Care After These instructions give you information on caring for yourself after your procedure. Your doctor may also give you more specific instructions. Call your doctor if you have any problems or questions after your procedure. HOME CARE Do not drive for 24 hours. Wait 1 week before doing any activities that wear you out. Take your temperature 2 times a day for 4 days. Write it down. Tell your doctor if you have a fever. Do not stand for a long time. Do not lift, push, or pull anything over 10 pounds (4.5 kilograms). Limit stair climbing to once or twice a day. Rest often. Continue with your usual diet. Drink enough fluids to keep your pee (urine) clear or pale yellow. If you have a hard time pooping (constipation), you may: Take a medicine to help you go poop (laxative) as told by your doctor. Eat more fruit and bran. Drink more fluids. Take showers, not baths, for as long as told by your doctor. Do not swim or use a hot tub until your doctor says it is okay. Have someone with you for 1-2 days after the procedure. Do not douche, use tampons, or have sex (intercourse) for 2 weeks. Only take medicines as told by your doctor. Do not take aspirin. It can cause bleeding. Keep all doctor visits. GET HELP IF: You have cramps or pain not helped by medicine. You have new pain in the belly (abdomen). You have a bad smelling fluid coming from your vagina. You have a rash. You have problems  with any medicine. GET HELP RIGHT AWAY IF:  You start to bleed more than a regular period. You have a fever. You have chest pain. You have trouble breathing. You feel dizzy or feel like passing out (fainting). You pass out. You have pain in the tops of your shoulders. You have vaginal bleeding  with or without clumps of blood (blood clots). MAKE SURE YOU: Understand these instructions. Will watch your condition. Will get help right away if you are not doing well or get worse. Document Released: 08/12/2008 Document Revised: 11/08/2013 Document Reviewed: 06/02/2013 [facility] Patient Information 2015 [place], [place]. This information is not intended to replace advice given to you by your health care provider. Make sure you discuss any questions you have with your health care provider.

## 2015-05-08 LAB — SURGICAL PATHOLOGY

## 2015-06-12 ENCOUNTER — Other Ambulatory Visit: Payer: Self-pay | Admitting: Internal Medicine

## 2015-07-11 ENCOUNTER — Other Ambulatory Visit: Payer: 59

## 2015-07-12 NOTE — H&P (Signed)
Expand All Collapse All    Chief Complaint:    Patient ID: Carla Green is a 54 y.o. female presenting with Pre Op Consulting  on 06/06/2015  HPI: Postmenopausal bleeding with thickened endometrial stripe, failed in office EMBx 2/2 cervical stenosis. Operative H&C revealed hyperplasia without atypia. Because patient was unable to tolerate in office EMBx and is not interested in further attempts, f/u for regression using progesterone treatment q3-6 months is not a viable option.   She is also experiencing significant pelvic pain with the bleeding. Pain wraps around pelvis to lower back and keeps her up at night.  She has decided on definitve management with hysterectomy.   3 term NSVDs Recent bleeding led her to get CBC; hgb 11. Allergies to codeine  Past Medical History:  has a past medical history of PVC (premature ventricular contraction); Insomnia; Varicose veins; IBS (irritable bowel syndrome); Chickenpox; and Gestational diabetes mellitus.  Past Surgical History:  has past surgical history that includes Femoral hernia repair; Tonsillectomy; Laser surgery for varicose veins; Colonoscopy (02/23/2012); and egd (02/23/2012). Family History: family history includes Colon cancer in her father; Diabetes mellitus in her father; Diabetes type II in her father; Heart attack in her father; Hypertension in her other; Mental illness in her brother; Osteoporosis in her other; Rheum arthritis in her other. Social History:  reports that she has never smoked. She does not have any smokeless tobacco history on file. She reports that she does not drink alcohol or use illicit drugs. OB/GYN History:  OB History    Gravida Para Term Preterm AB TAB SAB Ectopic Multiple Living   4 3 3  1  1   3       Allergies: is allergic to codeine sulfate. Medications:  Current outpatient prescriptions:  . ALPRAZolam (XANAX) 0.5 MG tablet, , Disp: , Rfl:  . conjugated estrogens  (PREMARIN) 0.625 mg/gram vaginal cream, Place 0.5 g vaginally twice a week. 1 applicator twice weekly, Disp: 42.5 g, Rfl: 3 . ergocalciferol (DRISDOL) 50,000 unit capsule, , Disp: , Rfl:  . fluocinonide-emollient 0.05 % Crea, , Disp: , Rfl:  . gabapentin (NEURONTIN) 300 MG capsule, 1 po qHS x 4 days, then bid thereafter, Disp: 60 capsule, Rfl: 2 . ibuprofen (ADVIL,MOTRIN) 800 MG tablet, Take 800 mg by mouth 3 (three) times daily as needed for Pain., Disp: , Rfl:  . levofloxacin (LEVAQUIN) 500 MG tablet, , Disp: , Rfl:  . sertraline (ZOLOFT) 25 MG tablet, Take 25 mg by mouth once daily., Disp: , Rfl:  . valacyclovir (VALTREX) 500 MG tablet, , Disp: , Rfl:   Review of Systems  Constitutional: Negative. Negative for fever and fatigue.  HENT: Negative.  Respiratory: Negative. Negative for shortness of breath.  Cardiovascular: Negative for chest pain and palpitations.  Gastrointestinal: Negative for abdominal pain, diarrhea and constipation.  Endocrine: Negative for cold intolerance.  Genitourinary: Positive for vaginal bleeding, pelvic pain and dyspareunia. Negative for dysuria, frequency, hematuria, vaginal discharge, difficulty urinating and menstrual problem.  Neurological: Negative for dizziness and light-headedness.  Psychiatric/Behavioral:   No changes in mood    Exam:     BP 135/77 mmHg  Pulse 75  Wt 53.887 kg (118 lb 12.8 oz) Physical Exam  Constitutional: She is oriented to person, place, and time. She appears well-developed and well-nourished. No distress.  Eyes: No scleral icterus.  Neck: Normal range of motion. Neck supple. No tracheal deviation present. No thyromegaly present.  Cardiovascular: Normal rate, regular rhythm and normal heart sounds.  No  murmur heard. Pulmonary/Chest: Effort normal and breath sounds normal. She has no wheezes. She has no rales. Right breast exhibits no inverted nipple, no mass, no nipple discharge, no skin change and  no tenderness. Left breast exhibits no inverted nipple, no mass, no nipple discharge, no skin change and no tenderness. Breasts are symmetrical.  Abdominal: She exhibits no distension and no mass. There is no tenderness. There is no rebound and no guarding.  Genitourinary:   Pelvic exam:  External: Tanner stage 5, normal female genitalia without lesions or masses  Bladder: Normal size without masses or tenderness, well-supported  Urethra: No lesions or discharge with palpation. Normal urethral size and location, no prolapse  Vagina: normal physiological discharge, without lesions or masses  Cervix: normal without lesions or masses  Adnexa: normal bimanual exam without masses or fullness  Uterus: Normal size and position without masses or tenderness.   Anus/Perineum: Normal external exam  Musculoskeletal: Normal range of motion.  Lymphadenopathy:   She has no cervical adenopathy.  Neurological: She is alert and oriented to person, place, and time.  Skin: Skin is warm and dry.  Psychiatric: She has a normal mood and affect.          Impression:   The primary encounter diagnosis was Preop examination. Diagnoses of PMB (postmenopausal bleeding), Endometrial hyperplasia without atypia, simple, and Pelvic pain in female were also pertinent to this visit.    Plan:   Patient returns for a preoperative discussion regarding her plans to proceed with definitive surgical treatment of her hyperplasia, postmenopausal bleeding and pelvic pain by laproscopic assisted total vaginal hysterectomy with bilateral salpingoophorectomy.  I have also recommended pelvic floor physical therapy postoperatively. If surgery takes awhile, I have offered her cyclic progesterone to manage her bleeding and possibly pain.  The patient and I discussed the technical aspects of the procedure including the potential for risks and complications. These include  but are not limited to the risk of infection requiring post-operative antibiotics or further procedures. We talked about the risk of injury to adjacent organs including bladder, bowel, ureter, blood vessels or nerves. We talked about the need to convert to an open incision. We talked about the possible need for blood transfusion. We talked aboutpostop complications such asthromboembolic or cardiopulmonary complications. All of her questions were answered. Her preoperative exam was completed and the appropriate consents were signed. She is scheduled to undergo this procedure in the near future.

## 2015-07-18 ENCOUNTER — Other Ambulatory Visit: Payer: 59

## 2015-07-19 ENCOUNTER — Encounter
Admission: RE | Admit: 2015-07-19 | Discharge: 2015-07-19 | Disposition: A | Discharge status: Home / Self Care | Payer: 59 | Source: Ambulatory Visit | Attending: Obstetrics and Gynecology | Admitting: Obstetrics and Gynecology

## 2015-07-19 DIAGNOSIS — Z01812 Encounter for preprocedural laboratory examination: Secondary | ICD-10-CM | POA: Insufficient documentation

## 2015-07-19 LAB — CBC
HEMATOCRIT: 38.6 % (ref 35.0–47.0)
HEMOGLOBIN: 13 g/dL (ref 12.0–16.0)
MCH: 31.3 pg (ref 26.0–34.0)
MCHC: 33.6 g/dL (ref 32.0–36.0)
MCV: 93.3 fL (ref 80.0–100.0)
Platelets: 166 10*3/uL (ref 150–440)
RBC: 4.14 MIL/uL (ref 3.80–5.20)
RDW: 12.2 % (ref 11.5–14.5)
WBC: 4.1 10*3/uL (ref 3.6–11.0)

## 2015-07-19 LAB — BASIC METABOLIC PANEL
ANION GAP: 9 (ref 5–15)
BUN: 9 mg/dL (ref 6–20)
CHLORIDE: 104 mmol/L (ref 101–111)
CO2: 30 mmol/L (ref 22–32)
Calcium: 9.7 mg/dL (ref 8.9–10.3)
Creatinine, Ser: 0.68 mg/dL (ref 0.44–1.00)
GFR calc Af Amer: >60 mL/min (ref >60)
GLUCOSE: 71 mg/dL (ref 65–99)
POTASSIUM: 3.6 mmol/L (ref 3.5–5.1)
SODIUM: 143 mmol/L (ref 135–145)

## 2015-07-19 LAB — TYPE AND SCREEN
ABO/RH(D): A POS
ANTIBODY SCREEN: NEGATIVE

## 2015-07-19 NOTE — Patient Instructions (Signed)
  Your procedure is scheduled on: Monday July 30, 2015. Report to Same Day Surgery. To find out your arrival time please call [phone] between 1PM - 3PM on Friday July 27, 2015 .  Remember: Instructions that are not followed completely may result in serious medical risk, up to and including death, or upon the discretion of your surgeon and anesthesiologist your surgery may need to be rescheduled.    __x__ 1. Do not eat food or drink liquids after midnight. No gum chewing or hard candies.     ____ 2. No Alcohol for 24 hours before or after surgery.   ____ 3. Bring all medications with you on the day of surgery if instructed.    __x__ 4. Notify your doctor if there is any change in your medical condition     (cold, fever, infections).     Do not wear jewelry, make-up, hairpins, clips or nail polish.  Do not wear lotions, powders, or perfumes. You may wear deodorant.  Do not shave 48 hours prior to surgery. Men may shave face and neck.  Do not bring valuables to the hospital.    [facility] is not responsible for any belongings or valuables.               Contacts, dentures or bridgework may not be worn into surgery.  Leave your suitcase in the car. After surgery it may be brought to your room.  For patients admitted to the hospital, discharge time is determined by your treatment team.   Patients discharged the day of surgery will not be allowed to drive home.    Please read over the following fact sheets that you were given:   [facility] Preparing for Surgery  __x__ Take these medicines the morning of surgery with A SIP OF WATER:    1. metoprolol succinate (TOPROL-XL)   ____ Fleet Enema (as directed)   __x__ Use CHG Soap as directed  ____ Use inhalers on the day of surgery  ____ Stop metformin 2 days prior to surgery    ____ Take 1/2 of usual insulin dose the night before surgery and none on the morning of surgery.   ____ Stop Coumadin/Plavix/aspirin on does  not apply.  ____ Stop Anti-inflammatories on July 23, 2015.   ____ Stop supplements until after surgery.    ____ Bring C-Pap to the hospital.

## 2015-07-30 ENCOUNTER — Encounter
Admission: RE | Disposition: A | Discharge status: Home / Self Care | Payer: Self-pay | Source: Ambulatory Visit | Attending: Obstetrics and Gynecology

## 2015-07-30 ENCOUNTER — Observation Stay
Admission: RE | Admit: 2015-07-30 | Discharge: 2015-07-31 | Disposition: A | Discharge status: Home / Self Care | Payer: 59 | Source: Ambulatory Visit | Attending: Obstetrics and Gynecology | Admitting: Obstetrics and Gynecology

## 2015-07-30 ENCOUNTER — Ambulatory Visit: Payer: 59 | Admitting: Certified Registered"

## 2015-07-30 ENCOUNTER — Encounter: Payer: Self-pay | Admitting: *Deleted

## 2015-07-30 DIAGNOSIS — N8331 Acquired atrophy of ovary: Secondary | ICD-10-CM | POA: Diagnosis not present

## 2015-07-30 DIAGNOSIS — Z9889 Other specified postprocedural states: Secondary | ICD-10-CM | POA: Diagnosis not present

## 2015-07-30 DIAGNOSIS — Z833 Family history of diabetes mellitus: Secondary | ICD-10-CM | POA: Insufficient documentation

## 2015-07-30 DIAGNOSIS — Z791 Long term (current) use of non-steroidal anti-inflammatories (NSAID): Secondary | ICD-10-CM | POA: Diagnosis not present

## 2015-07-30 DIAGNOSIS — Z8261 Family history of arthritis: Secondary | ICD-10-CM | POA: Insufficient documentation

## 2015-07-30 DIAGNOSIS — K589 Irritable bowel syndrome without diarrhea: Secondary | ICD-10-CM | POA: Insufficient documentation

## 2015-07-30 DIAGNOSIS — N950 Postmenopausal bleeding: Secondary | ICD-10-CM | POA: Insufficient documentation

## 2015-07-30 DIAGNOSIS — Z885 Allergy status to narcotic agent status: Secondary | ICD-10-CM | POA: Insufficient documentation

## 2015-07-30 DIAGNOSIS — Z79899 Other long term (current) drug therapy: Secondary | ICD-10-CM | POA: Diagnosis not present

## 2015-07-30 DIAGNOSIS — N800 Endometriosis of uterus: Secondary | ICD-10-CM | POA: Diagnosis not present

## 2015-07-30 DIAGNOSIS — N838 Other noninflammatory disorders of ovary, fallopian tube and broad ligament: Secondary | ICD-10-CM | POA: Diagnosis not present

## 2015-07-30 DIAGNOSIS — Z8489 Family history of other specified conditions: Secondary | ICD-10-CM | POA: Diagnosis not present

## 2015-07-30 DIAGNOSIS — N858 Other specified noninflammatory disorders of uterus: Secondary | ICD-10-CM | POA: Diagnosis not present

## 2015-07-30 DIAGNOSIS — Z8041 Family history of malignant neoplasm of ovary: Secondary | ICD-10-CM | POA: Insufficient documentation

## 2015-07-30 DIAGNOSIS — Z800 Family history of malignant neoplasm of digestive organs: Secondary | ICD-10-CM | POA: Insufficient documentation

## 2015-07-30 DIAGNOSIS — Z818 Family history of other mental and behavioral disorders: Secondary | ICD-10-CM | POA: Diagnosis not present

## 2015-07-30 DIAGNOSIS — D251 Intramural leiomyoma of uterus: Secondary | ICD-10-CM | POA: Diagnosis not present

## 2015-07-30 DIAGNOSIS — G4700 Insomnia, unspecified: Secondary | ICD-10-CM | POA: Insufficient documentation

## 2015-07-30 DIAGNOSIS — R112 Nausea with vomiting, unspecified: Secondary | ICD-10-CM | POA: Insufficient documentation

## 2015-07-30 DIAGNOSIS — R102 Pelvic and perineal pain: Secondary | ICD-10-CM | POA: Insufficient documentation

## 2015-07-30 DIAGNOSIS — Z79890 Hormone replacement therapy (postmenopausal): Secondary | ICD-10-CM | POA: Diagnosis not present

## 2015-07-30 DIAGNOSIS — N8500 Endometrial hyperplasia, unspecified: Secondary | ICD-10-CM | POA: Diagnosis present

## 2015-07-30 DIAGNOSIS — N882 Stricture and stenosis of cervix uteri: Secondary | ICD-10-CM | POA: Insufficient documentation

## 2015-07-30 DIAGNOSIS — Z8249 Family history of ischemic heart disease and other diseases of the circulatory system: Secondary | ICD-10-CM | POA: Diagnosis not present

## 2015-07-30 LAB — POCT PREGNANCY, URINE: Preg Test, Ur: NEGATIVE

## 2015-07-30 SURGERY — HYSTERECTOMY, VAGINAL, LAPAROSCOPY-ASSISTED, WITH SALPINGO-OOPHORECTOMY
Anesthesia: General | Site: Uterus | Laterality: Bilateral | Wound class: Clean Contaminated

## 2015-07-30 MED ORDER — PROMETHAZINE HCL 25 MG/ML IJ SOLN
25.0000 mg | INTRAMUSCULAR | Status: DC | PRN
  Administered 2015-07-30 – 2015-07-31 (×2): 25 mg via INTRAVENOUS
  Filled 2015-07-30 (×3): qty 1

## 2015-07-30 MED ORDER — LACTATED RINGERS IV SOLN
INTRAVENOUS | Status: DC
  Administered 2015-07-30 (×2): via INTRAVENOUS

## 2015-07-30 MED ORDER — FLUORESCEIN SODIUM 10 % IJ SOLN
INTRAMUSCULAR | Status: AC
Start: 2015-07-30 — End: 2015-07-30
  Filled 2015-07-30: qty 5

## 2015-07-30 MED ORDER — FENTANYL CITRATE (PF) 100 MCG/2ML IJ SOLN
INTRAMUSCULAR | Status: DC | PRN
  Administered 2015-07-30 (×2): 50 ug via INTRAVENOUS
  Administered 2015-07-30: 100 ug via INTRAVENOUS

## 2015-07-30 MED ORDER — MIDAZOLAM HCL 2 MG/2ML IJ SOLN
INTRAMUSCULAR | Status: DC | PRN
  Administered 2015-07-30: 2 mg via INTRAVENOUS

## 2015-07-30 MED ORDER — BUPIVACAINE HCL (PF) 0.5 % IJ SOLN
INTRAMUSCULAR | Status: AC
  Filled 2015-07-30: qty 30

## 2015-07-30 MED ORDER — ALPRAZOLAM 0.5 MG PO TABS
0.5000 mg | ORAL_TABLET | Freq: Every day | ORAL | Status: DC
  Administered 2015-07-30: 0.5 mg via ORAL
  Filled 2015-07-30: qty 1

## 2015-07-30 MED ORDER — ONDANSETRON HCL 4 MG/2ML IJ SOLN: 4.0000 mg | Freq: Once | INTRAMUSCULAR | Status: DC | PRN

## 2015-07-30 MED ORDER — LIDOCAINE HCL (CARDIAC) 20 MG/ML IV SOLN
INTRAVENOUS | Status: DC | PRN
  Administered 2015-07-30: 100 mg via INTRAVENOUS

## 2015-07-30 MED ORDER — OXYCODONE-ACETAMINOPHEN 5-325 MG PO TABS
1.0000 | ORAL_TABLET | ORAL | Status: DC | PRN
  Administered 2015-07-31 (×2): 1 via ORAL
  Filled 2015-07-30 (×2): qty 1

## 2015-07-30 MED ORDER — SERTRALINE HCL 50 MG PO TABS
25.0000 mg | ORAL_TABLET | Freq: Every day | ORAL | Status: DC
  Administered 2015-07-30: 25 mg via ORAL
  Filled 2015-07-30: qty 1

## 2015-07-30 MED ORDER — FENTANYL CITRATE (PF) 100 MCG/2ML IJ SOLN
INTRAMUSCULAR | Status: AC
  Filled 2015-07-30: qty 2

## 2015-07-30 MED ORDER — LACTATED RINGERS IV SOLN
INTRAVENOUS | Status: DC
  Administered 2015-07-30: 10:00:00 via INTRAVENOUS

## 2015-07-30 MED ORDER — SUGAMMADEX SODIUM 200 MG/2ML IV SOLN
INTRAVENOUS | Status: DC | PRN
  Administered 2015-07-30: 100 mg via INTRAVENOUS

## 2015-07-30 MED ORDER — ONDANSETRON HCL 4 MG PO TABS: 4.0000 mg | ORAL_TABLET | Freq: Four times a day (QID) | ORAL | Status: DC | PRN

## 2015-07-30 MED ORDER — ROCURONIUM BROMIDE 100 MG/10ML IV SOLN
INTRAVENOUS | Status: DC | PRN
  Administered 2015-07-30: 10 mg via INTRAVENOUS
  Administered 2015-07-30: 30 mg via INTRAVENOUS
  Administered 2015-07-30: 10 mg via INTRAVENOUS

## 2015-07-30 MED ORDER — PROPOFOL 10 MG/ML IV BOLUS
INTRAVENOUS | Status: DC | PRN
  Administered 2015-07-30: 110 mg via INTRAVENOUS

## 2015-07-30 MED ORDER — MENTHOL 3 MG MT LOZG
1.0000 | LOZENGE | OROMUCOSAL | Status: DC | PRN
  Filled 2015-07-30: qty 9

## 2015-07-30 MED ORDER — ONDANSETRON HCL 4 MG/2ML IJ SOLN
4.0000 mg | Freq: Four times a day (QID) | INTRAMUSCULAR | Status: DC | PRN
  Administered 2015-07-30 – 2015-07-31 (×4): 4 mg via INTRAVENOUS
  Filled 2015-07-30 (×4): qty 2

## 2015-07-30 MED ORDER — ONDANSETRON HCL 4 MG/2ML IJ SOLN
INTRAMUSCULAR | Status: DC | PRN
  Administered 2015-07-30: 4 mg via INTRAVENOUS

## 2015-07-30 MED ORDER — IBUPROFEN 800 MG PO TABS
800.0000 mg | ORAL_TABLET | Freq: Four times a day (QID) | ORAL | Status: DC | PRN
  Administered 2015-07-31: 800 mg via ORAL
  Filled 2015-07-30: qty 1

## 2015-07-30 MED ORDER — KETOROLAC TROMETHAMINE 30 MG/ML IJ SOLN
30.0000 mg | Freq: Once | INTRAMUSCULAR | Status: AC
  Administered 2015-07-30: 30 mg via INTRAVENOUS
  Filled 2015-07-30: qty 1

## 2015-07-30 MED ORDER — FAMOTIDINE 20 MG PO TABS
20.0000 mg | ORAL_TABLET | Freq: Once | ORAL | Status: AC
  Administered 2015-07-30: 20 mg via ORAL

## 2015-07-30 MED ORDER — FENTANYL CITRATE (PF) 100 MCG/2ML IJ SOLN
25.0000 ug | INTRAMUSCULAR | Status: DC | PRN
  Administered 2015-07-30 (×4): 25 ug via INTRAVENOUS

## 2015-07-30 MED ORDER — METOPROLOL SUCCINATE ER 50 MG PO TB24
50.0000 mg | ORAL_TABLET | Freq: Every day | ORAL | Status: DC
  Administered 2015-07-31: 50 mg via ORAL
  Filled 2015-07-30 (×2): qty 1

## 2015-07-30 MED ORDER — HYDROMORPHONE HCL 1 MG/ML IJ SOLN
0.2000 mg | INTRAMUSCULAR | Status: DC | PRN
  Administered 2015-07-30 – 2015-07-31 (×3): 0.4 mg via INTRAVENOUS
  Administered 2015-07-31: 0.6 mg via INTRAVENOUS
  Administered 2015-07-31: 0.4 mg via INTRAVENOUS
  Filled 2015-07-30 (×5): qty 1

## 2015-07-30 MED ORDER — PROMETHAZINE HCL 25 MG PO TABS: 25.0000 mg | ORAL_TABLET | ORAL | Status: DC | PRN

## 2015-07-30 MED ORDER — PROMETHAZINE HCL 25 MG RE SUPP
25.0000 mg | RECTAL | Status: DC | PRN
  Filled 2015-07-30: qty 1

## 2015-07-30 MED ORDER — SUCCINYLCHOLINE CHLORIDE 20 MG/ML IJ SOLN
INTRAMUSCULAR | Status: DC | PRN
Start: 2015-07-30 — End: 2015-07-30

## 2015-07-30 MED ORDER — DOCUSATE SODIUM 100 MG PO CAPS
100.0000 mg | ORAL_CAPSULE | Freq: Two times a day (BID) | ORAL | Status: DC
  Administered 2015-07-30 – 2015-07-31 (×2): 100 mg via ORAL
  Filled 2015-07-30 (×2): qty 1

## 2015-07-30 MED ORDER — FAMOTIDINE 20 MG PO TABS
ORAL_TABLET | ORAL | Status: AC
  Administered 2015-07-30: 20 mg via ORAL
  Filled 2015-07-30: qty 1

## 2015-07-30 MED ORDER — EPHEDRINE SULFATE 50 MG/ML IJ SOLN
INTRAMUSCULAR | Status: DC | PRN
  Administered 2015-07-30: 10 mg via INTRAVENOUS

## 2015-07-30 MED ORDER — LACTATED RINGERS IV SOLN
INTRAVENOUS | Status: DC
  Administered 2015-07-30 – 2015-07-31 (×2): via INTRAVENOUS

## 2015-07-30 MED ORDER — LIDOCAINE-EPINEPHRINE 1 %-1:100000 IJ SOLN
INTRAMUSCULAR | Status: AC
  Filled 2015-07-30: qty 1

## 2015-07-30 MED ORDER — PHENYLEPHRINE HCL 10 MG/ML IJ SOLN
INTRAMUSCULAR | Status: DC | PRN
  Administered 2015-07-30 (×3): 100 ug via INTRAVENOUS

## 2015-07-30 MED ORDER — CEFAZOLIN SODIUM-DEXTROSE 2-3 GM-% IV SOLR
2.0000 g | INTRAVENOUS | Status: AC
  Administered 2015-07-30: 2 g via INTRAVENOUS

## 2015-07-30 MED ORDER — BUPIVACAINE HCL 0.5 % IJ SOLN
INTRAMUSCULAR | Status: DC | PRN
  Administered 2015-07-30: 14 mL

## 2015-07-30 MED ORDER — DEXAMETHASONE SODIUM PHOSPHATE 4 MG/ML IJ SOLN
INTRAMUSCULAR | Status: DC | PRN
  Administered 2015-07-30: 10 mg via INTRAVENOUS

## 2015-07-30 MED ORDER — LIDOCAINE-EPINEPHRINE 1 %-1:100000 IJ SOLN
INTRAMUSCULAR | Status: DC | PRN
  Administered 2015-07-30: 20 mL

## 2015-07-30 MED ORDER — METOCLOPRAMIDE HCL 5 MG/ML IJ SOLN
10.0000 mg | Freq: Four times a day (QID) | INTRAMUSCULAR | Status: DC | PRN
  Administered 2015-07-30: 10 mg via INTRAVENOUS
  Filled 2015-07-30: qty 2

## 2015-07-30 SURGICAL SUPPLY — 45 items
BAG URO DRAIN 2000ML W/SPOUT (MISCELLANEOUS) ×3 IMPLANT
CANISTER SUCT 1200ML W/VALVE (MISCELLANEOUS) ×3 IMPLANT
CATH FOLEY 2WAY  5CC 16FR (CATHETERS) ×2
CATH URTH 16FR FL 2W BLN LF (CATHETERS) ×1 IMPLANT
CLOSURE WOUND 1/2 X4 (GAUZE/BANDAGES/DRESSINGS) ×1
DRAPE SHEET LG 3/4 BI-LAMINATE (DRAPES) ×3 IMPLANT
FILTER LAP SMOKE EVAC STRL (MISCELLANEOUS) ×3 IMPLANT
GAUZE PACK 2X3YD (MISCELLANEOUS) ×3 IMPLANT
GLOVE BIO SURGEON STRL SZ 6.5 (GLOVE) ×2 IMPLANT
GLOVE BIO SURGEONS STRL SZ 6.5 (GLOVE) ×1
GLOVE INDICATOR 7.0 STRL GRN (GLOVE) ×3 IMPLANT
GOWN STRL REUS W/ TWL LRG LVL3 (GOWN DISPOSABLE) ×2 IMPLANT
GOWN STRL REUS W/ TWL XL LVL3 (GOWN DISPOSABLE) ×1 IMPLANT
GOWN STRL REUS W/TWL LRG LVL3 (GOWN DISPOSABLE) ×4
GOWN STRL REUS W/TWL XL LVL3 (GOWN DISPOSABLE) ×2
IRRIGATION STRYKERFLOW (MISCELLANEOUS) ×1 IMPLANT
IRRIGATOR STRYKERFLOW (MISCELLANEOUS) ×3
IV LACTATED RINGERS 1000ML (IV SOLUTION) ×3 IMPLANT
KIT RM TURNOVER CYSTO AR (KITS) ×3 IMPLANT
LABEL OR SOLS (LABEL) ×3 IMPLANT
LIGASURE BLUNT 5MM 37CM (INSTRUMENTS) ×3 IMPLANT
NDL SAFETY 22GX1.5 (NEEDLE) ×3 IMPLANT
NEEDLE VERESS 14GA 120MM (NEEDLE) ×3 IMPLANT
NS IRRIG 500ML POUR BTL (IV SOLUTION) ×3 IMPLANT
PACK BASIN MINOR ARMC (MISCELLANEOUS) ×3 IMPLANT
PACK GYN LAPAROSCOPIC (MISCELLANEOUS) ×3 IMPLANT
PAD GROUND ADULT SPLIT (MISCELLANEOUS) ×3 IMPLANT
PAD OB MATERNITY 4.3X12.25 (PERSONAL CARE ITEMS) ×3 IMPLANT
PAD PREP 24X41 OB/GYN DISP (PERSONAL CARE ITEMS) ×3 IMPLANT
SET CYSTO W/LG BORE CLAMP LF (SET/KITS/TRAYS/PACK) ×3 IMPLANT
SHEARS HARMONIC ACE PLUS 36CM (ENDOMECHANICALS) IMPLANT
SLEEVE ENDOPATH XCEL 5M (ENDOMECHANICALS) ×3 IMPLANT
SPONGE XRAY 4X4 16PLY STRL (MISCELLANEOUS) ×3 IMPLANT
STRIP CLOSURE SKIN 1/2X4 (GAUZE/BANDAGES/DRESSINGS) ×2 IMPLANT
SUT PDS AB 2-0 CT1 27 (SUTURE) ×6 IMPLANT
SUT VIC AB 0 CT1 27 (SUTURE) ×2
SUT VIC AB 0 CT1 27XCR 8 STRN (SUTURE) ×1 IMPLANT
SUT VIC AB 0 CT1 36 (SUTURE) ×15 IMPLANT
SUT VIC AB 2-0 UR6 27 (SUTURE) ×3 IMPLANT
SUT VIC AB 4-0 FS2 27 (SUTURE) ×3 IMPLANT
SYR CONTROL 10ML (SYRINGE) ×3 IMPLANT
SYRINGE 10CC LL (SYRINGE) ×3 IMPLANT
TROCAR ENDO BLADELESS 11MM (ENDOMECHANICALS) ×3 IMPLANT
TROCAR XCEL NON-BLD 5MMX100MML (ENDOMECHANICALS) ×3 IMPLANT
TUBING INSUFFLATOR HEATED (MISCELLANEOUS) ×3 IMPLANT

## 2015-07-30 NOTE — Progress Notes (Signed)
Day of Surgery Procedure(s) (LRB): LAPAROSCOPIC ASSISTED VAGINAL HYSTERECTOMY WITH BILATERAL SALPINGO OOPHORECTOMY AND CYSTOSCOPY (Bilateral)  Subjective: Patient reports nausea, vomiting and incisional pain.    Objective: I have reviewed patient's vital signs, intake and output and medications.  General: cooperative, fatigued and pale Resp: clear to auscultation bilaterally Cardio: regular rate and rhythm, S1, S2 normal, no murmur, click, rub or gallop GI: abnormal findings:  absent bowel sounds and soft, appropriately tender Extremities: extremities normal, atraumatic, no cyanosis or edema Vaginal Bleeding: minimal  Assessment: s/p Procedure(s): LAPAROSCOPIC ASSISTED VAGINAL HYSTERECTOMY WITH BILATERAL SALPINGO OOPHORECTOMY AND CYSTOSCOPY (Bilateral): stable and ileus present  Plan: Encourage ambulation NPO with sips/chips and gum.  Continue pain meds PRN CBC in am Foley out in am   LOS: 0 days    Bryan Goin 07/30/2015, 6:31 PM

## 2015-07-30 NOTE — Anesthesia Preprocedure Evaluation (Signed)
Anesthesia Evaluation  Patient identified by MRN, date of birth, ID band Patient awake    Reviewed: Allergy & Precautions, H&P , NPO status , Patient's Chart, lab work & pertinent test results, reviewed documented beta blocker date and time   History of Anesthesia Complications (+) PONV and history of anesthetic complications  Airway Mallampati: II  TM Distance: >3 FB Neck ROM: full    Dental  (+) Teeth Intact   Pulmonary neg pulmonary ROS, Current Smoker,    Pulmonary exam normal        Cardiovascular hypertension, negative cardio ROS Normal cardiovascular exam+ dysrhythmias  Rhythm:regular Rate:Normal     Neuro/Psych negative neurological ROS  negative psych ROS   GI/Hepatic negative GI ROS, Neg liver ROS,   Endo/Other  negative endocrine ROS  Renal/GU negative Renal ROS  negative genitourinary   Musculoskeletal   Abdominal   Peds  Hematology negative hematology ROS (+)   Anesthesia Other Findings   Reproductive/Obstetrics negative OB ROS                             Anesthesia Physical Anesthesia Plan  ASA: II  Anesthesia Plan: General ETT   Post-op Pain Management:    Induction:   Airway Management Planned:   Additional Equipment:   Intra-op Plan:   Post-operative Plan:   Informed Consent: I have reviewed the patients History and Physical, chart, labs and discussed the procedure including the risks, benefits and alternatives for the proposed anesthesia with the patient or authorized representative who has indicated his/her understanding and acceptance.     Plan Discussed with: CRNA  Anesthesia Plan Comments:         Anesthesia Quick Evaluation

## 2015-07-30 NOTE — Op Note (Signed)
Carla Green PROCEDURE DATE: 07/30/2015   PREOPERATIVE DIAGNOSIS: Endometrial hyperplasia, pelvic pain, family history of ovarian cancer POSTOPERATIVE DIAGNOSIS: The same PROCEDURE: Laparoscopic assisted vaginal hysterectomy, bilateral salpingoophorectomy, cystoscopy SURGEON:  Dr. Angelina Pih  ASSISTANT: Dr. Gwen Her Schermerhorn  INDICATIONS: 54 y.o. P3 with aforementioned preoperative diagnoses here today for definitive surgical management.   Risks of surgery were discussed with the patient including but not limited to: bleeding which may require transfusion or reoperation; infection which may require antibiotics; injury to bowel, bladder, ureters or other surrounding organs; need for additional procedures including laparotomy; thromboembolic phenomenon, incisional problems and other postoperative/anesthesia complications. Written informed consent was obtained.    FINDINGS:  Small uterus, normal adnexa bilaterally.  No evidence of endometriosis, adhesions or any other abdominal/pelvic abnormality.  Normal upper abdomen.  ANESTHESIA:    General INTRAVENOUS FLUIDS: 1100 ml ESTIMATED BLOOD LOSS: 25 ml URINE OUTPUT: 594mL SPECIMENS: Uterus, cervix, bilateral fallopian tubes and ovaries. COMPLICATIONS: None immediate  LAVH/BSO: Laparoscopy to just taking the adnexa and completing vaginally  PROCEDURE IN DETAIL:  The patient received intravenous antibiotics and had sequential compression devices applied to her lower extremities while in the preoperative area.  She was then taken to the operating room where general anesthesia was administered and was found to be adequate.  She was placed in the dorsal lithotomy position, and was prepped and draped in a sterile manner.  A Foley catheter was inserted into her bladder and attached to constant drainage and a uterine manipulator was then advanced into the uterus.  After an adequate timeout was performed, attention was turned to the abdomen  where an umbilical incision was made with the scalpel.  A 5-mm trocar and sleeve were then advanced without difficulty with the laparoscope under direct visualization into the abdomen.  The abdomen was then insufflated with carbon dioxide gas and adequate pneumoperitoneum was obtained. Bilateral 5-mm lower quadrant ports were then placed under direct visualization.  A survey of the patient's pelvis and abdomen revealed the findings as above.  The round ligaments were clamped and transected with the Ligasure device on both sides. The bilateral infundibulopelvic ligaments were also clamped and transected with the Ligasure device.  Excellent hemostasis was noted, the decision was made to leave the trocars in place and proceed with completing the hysterectomy via the vaginal route .  Attention was then turned to her pelvis.  A weighted speculum was then placed in the vagina, and the anterior and posterior lips of the cervix were grasped bilaterally with tenaculums.  The cervix was then injected circumferentially with 0.5% Marcaine with epinephrine solution to maintain hemostasis.  The cervix was then circumferentially incised, and the anterior cul-de-sac was then entered sharply without difficulty and a retractor was placed.  The same procedure was performed posteriorly and the posterior cul-de-sac was entered sharply without difficulty.  A long weighted speculum was inserted into the posterior cul-de-sac.  The Heaney clamp was then used to clamp the uterosacral ligaments on either side.  They were then cut and sutured ligated with 0 Vicryl, and were held with a tag for later identification. Of note, all sutures used in this case were 0 Vicryl unless otherwise noted.   The cardinal ligaments were then clamped, cut and ligated bilaterally. The uterine vessels and broad ligaments were then serially clamped with the Heaney clamps, cut, and suture ligated on both sides.  The uterus was noted to be freed from all ligaments  and was then delivered and sent to  pathology.   After completion of the hysterectomy, all pedicles from the uterosacral ligament to the cornua were examined hemostasis was confirmed.   The vaginal cuff was then closed in a running locked fashion with 0 Vicryl with care given to incorporate the uterosacral pedicles bilaterally.  All instruments were then removed from the pelvis, and a vaginal packing saturated with estrogen cream was placed.    Attention was then returned to her abdomen which was insufflated again with carbon dioxide gas.  The laparoscope was used to survey the operative site, and it was found to be hemostatic.   No intraoperative injury to other surrounding organs was noted.  The abdomen was desufflated and all instruments were then removed from the patient's abdomen.  All skin incisions were closed with 4-0 vicryl stich and Dermabond.   Cystoscopy was performed without difficulty. Good ureteral efflux was noted bilaterally. The bladder was drained and the Foley catheter was replaced.  The patient tolerated the procedures well.  All instruments, needles, and sponge counts were correct x 2. The patient was taken to the recovery room awake, extubated and in stable condition.

## 2015-07-30 NOTE — Anesthesia Procedure Notes (Signed)
Procedure Name: Intubation Date/Time: 07/30/2015 11:58 AM Performed by: Silvana Newness Pre-anesthesia Checklist: Patient identified, Emergency Drugs available, Suction available, Patient being monitored and Timeout performed Patient Re-evaluated:Patient Re-evaluated prior to inductionOxygen Delivery Method: Circle system utilized Preoxygenation: Pre-oxygenation with 100% oxygen Intubation Type: IV induction Ventilation: Mask ventilation without difficulty Laryngoscope Size: Mac and 3 Grade View: Grade I Tube type: Oral Tube size: 7.0 mm Number of attempts: 1 Airway Equipment and Method: Rigid stylet Placement Confirmation: ETT inserted through vocal cords under direct vision,  positive ETCO2 and breath sounds checked- equal and bilateral Secured at: 19 cm Tube secured with: Tape Dental Injury: Teeth and Oropharynx as per pre-operative assessment

## 2015-07-30 NOTE — Interval H&P Note (Signed)
History and Physical Interval Note:  07/30/2015 11:37 AM  Carla Green  has presented today for surgery, with the diagnosis of ENDOMETRIAL HYPERPLASIA  The various methods of treatment have been discussed with the patient and family. After consideration of risks, benefits and other options for treatment, the patient has consented to  Procedure(s): LAPAROSCOPIC ASSISTED VAGINAL HYSTERECTOMY WITH SALPINGO OOPHORECTOMY (Bilateral) as a surgical intervention .  The patient's history has been reviewed, patient examined, no change in status, stable for surgery.  I have reviewed the patient's chart and labs.  Questions were answered to the patient's satisfaction.  Husband "Al" is present and daughter at bedside.   Benjaman Kindler

## 2015-07-30 NOTE — Transfer of Care (Signed)
Immediate Anesthesia Transfer of Care Note  Patient: Carla Green  Procedure(s) Performed: Procedure(s): LAPAROSCOPIC ASSISTED VAGINAL HYSTERECTOMY WITH BILATERAL SALPINGO OOPHORECTOMY AND CYSTOSCOPY (Bilateral)  Patient Location: PACU  Anesthesia Type:General  Level of Consciousness: awake, alert , oriented and patient cooperative  Airway & Oxygen Therapy: Patient Spontanous Breathing and Patient connected to face mask oxygen  Post-op Assessment: Report given to RN, Post -op Vital signs reviewed and stable and Patient moving all extremities X 4  Post vital signs: Reviewed and stable  Last Vitals:  Filed Vitals:   07/30/15 1446  BP: 146/76  Pulse: 95  Temp: 37.3 C  Resp: 18    Complications: No apparent anesthesia complications

## 2015-07-31 DIAGNOSIS — N800 Endometriosis of uterus: Secondary | ICD-10-CM | POA: Diagnosis not present

## 2015-07-31 LAB — CBC
HCT: 32.6 % — ABNORMAL LOW (ref 35.0–47.0)
HEMOGLOBIN: 11.3 g/dL — AB (ref 12.0–16.0)
MCH: 31.8 pg (ref 26.0–34.0)
MCHC: 34.8 g/dL (ref 32.0–36.0)
MCV: 91.3 fL (ref 80.0–100.0)
PLATELETS: 144 10*3/uL — AB (ref 150–440)
RBC: 3.57 MIL/uL — ABNORMAL LOW (ref 3.80–5.20)
RDW: 12 % (ref 11.5–14.5)
WBC: 9.4 10*3/uL (ref 3.6–11.0)

## 2015-07-31 LAB — BASIC METABOLIC PANEL
Anion gap: 4 — ABNORMAL LOW (ref 5–15)
BUN: 13 mg/dL (ref 6–20)
CHLORIDE: 107 mmol/L (ref 101–111)
CO2: 29 mmol/L (ref 22–32)
CREATININE: 0.68 mg/dL (ref 0.44–1.00)
Calcium: 8.6 mg/dL — ABNORMAL LOW (ref 8.9–10.3)
GFR calc Af Amer: >60 mL/min (ref >60)
GFR calc non Af Amer: >60 mL/min (ref >60)
Glucose, Bld: 107 mg/dL — ABNORMAL HIGH (ref 65–99)
Potassium: 4.3 mmol/L (ref 3.5–5.1)
SODIUM: 140 mmol/L (ref 135–145)

## 2015-07-31 MED ORDER — ONDANSETRON HCL 4 MG PO TABS: 4.0000 mg | ORAL_TABLET | Freq: Four times a day (QID) | ORAL | Status: DC | PRN

## 2015-07-31 MED ORDER — DOCUSATE SODIUM 100 MG PO CAPS: 100.0000 mg | ORAL_CAPSULE | Freq: Two times a day (BID) | ORAL | Status: DC

## 2015-07-31 MED ORDER — OXYCODONE-ACETAMINOPHEN 5-325 MG PO TABS: 1.0000 | ORAL_TABLET | ORAL | Status: DC | PRN

## 2015-07-31 NOTE — Progress Notes (Signed)
D/C order from MD.  Reviewed d/c instructions and prescriptions with patient and answered any questions.  Patient d/c home via wheelchair by nursing/auxillary. 

## 2015-07-31 NOTE — Discharge Summary (Signed)
Please see signed discharge summary

## 2015-07-31 NOTE — Discharge Instructions (Signed)
Signs and Symptoms to Report Call our office at [phone] if you have any of the following.   Fever over 100.4 degrees or higher  Severe stomach pain not relieved with pain medications  Bright red bleeding thats heavier than a period that does not slow with rest  To go the bathroom a lot (frequency), you cant hold your urine (urgency), or it hurts when you empty your bladder (urinate)  Chest pain  Shortness of breath  Pain in the calves of your legs  Severe nausea and vomiting not relieved with anti-nausea medications  Signs of infection around your wounds, such as redness, hot to touch, swelling, green/yellow drainage (like pus), bad smelling discharge  Any concerns  What You Can Expect after Surgery  You may see some pink tinged, bloody fluid and bruising around the wound. This is normal.  You may notice shoulder and neck pain. This is caused by the gas used during surgery to expand your abdomen so your surgeon could get to the uterus easier.  You may have a sore throat because of the tube in your mouth during general anesthesia. This will go away in 2 to 3 days.  You may have some stomach cramps.  You may notice spotting on your panties.  You may have pain around the incision sites.   Activities after Your Discharge Follow these guidelines to help speed your recovery at home:  Do the coughing and deep breathing as you did in the hospital for 2 weeks. Use the small blue breathing device, called the incentive spirometer for 2 weeks.  Dont drive if you are in pain or taking narcotic pain medicine. You may drive when you can safely slam on the brakes, turn the wheel forcefully, and rotate your torso comfortably. This is typically 1-2 weeks. Practice in a parking lot or side street prior to attempting to drive regularly.   Ask others to help with household chores for 4 weeks.  Do not lift anything heavier that 10 pounds for 4-6 weeks. This includes pets,  children, and groceries.  Dont do strenuous activities, exercises, or sports like vacuuming, tennis, squash, etc. until your doctor says it is safe to do so. ---If you had a hysterectomy (abdominal, laparoscopic, or vaginal) do not have intercourse for 8-10 weeks.   Walk as you feel able. Rest often since it may take two or three weeks for your energy level to return to normal.   You may climb stairs  Avoid constipation:   -Eat fruits, vegetables, and whole grains. Eat small meals as your appetite will take time to return to normal.   -Drink 6 to 8 glasses of water each day unless your doctor has told you to limit your fluids.   -Use a laxative or stool softener as needed if constipation becomes a problem. You may take Miralax, metamucil, Citrucil, Colace, Senekot, FiberCon, etc. If this does not relieve the constipation, try two tablespoons of Milk Of Magnesia every 8 hours until your bowels move.   You may shower. Gently wash the wounds with a mild soap and water. Pat dry.  Do not get in a hot tub, swimming pool, etc. until your doctor agrees.  Do not use lotions, oils, powders on the wounds.  Do not douche, use tampons, or have sex until your doctor says it is okay.  Take your pain medicine when you need it. The medicine may not work as well if the pain is bad.  Take the medicines you were  taking before surgery. Other medications you will need are pain medications (Norco or Percocet) and nausea medications (Zofran).  ° °

## 2015-07-31 NOTE — Discharge Summary (Signed)
Physician Discharge Summary  Patient ID: Carla Green MRN: [id number] DOB/AGE: 1961-04-08 54 y.o.  Admit date: 07/30/2015 Discharge date: 07/31/2015  Admission Diagnoses:  Discharge Diagnoses:  Active Problems:   Postoperative state   Discharged Condition: stable  Hospital Course: Pt admitted for overnight obs after LAVH, BSO and cystoscopy for PMB with simple hyperplasia and cervical stenosis. Uncomplicated postop course with some nausea and absent bowel sounds POD#0. By POD#1, pt tolerating regular diet, voiding and ambulating without difficulty.  Consults: None  Significant Diagnostic Studies: labs: Hgb postop 11.3  Discharge Exam: Blood pressure 102/45, pulse 80, temperature 97.9 F (36.6 C), temperature source Oral, resp. rate 18, height 5\' 2"  (1.575 m), weight 52.164 kg (115 lb), last menstrual period 04/01/2014, SpO2 98 %. General appearance: alert, cooperative, appears stated age and no distress Head: Normocephalic, without obvious abnormality, atraumatic Resp: clear to auscultation bilaterally Cardio: regular rate and rhythm, S1, S2 normal, no murmur, click, rub or gallop GI: soft, non-tender; bowel sounds normal; no masses,  no organomegaly Extremities: extremities normal, atraumatic, no cyanosis or edema Pulses: 2+ and symmetric  Disposition: 01-Home or Self Care     Medication List    TAKE these medications        ALPRAZolam 0.5 MG tablet  Commonly known as:  XANAX  TAKE 1 TABLET AT BEDTIME     docusate sodium 100 MG capsule  Commonly known as:  COLACE  Take 1 capsule (100 mg total) by mouth 2 (two) times daily.     ibuprofen 800 MG tablet  Commonly known as:  ADVIL,MOTRIN  Take 800 mg by mouth every 6 (six) hours as needed.     ibuprofen 800 MG tablet  Commonly known as:  ADVIL,MOTRIN  Take 1 tablet (800 mg total) by mouth every 8 (eight) hours as needed for moderate pain.     metoprolol succinate 50 MG 24 hr tablet  Commonly known as:   TOPROL-XL  Take 1 tablet (50 mg total) by mouth daily.     ondansetron 4 MG tablet  Commonly known as:  ZOFRAN  Take 1 tablet (4 mg total) by mouth every 6 (six) hours as needed for nausea.     oxyCODONE-acetaminophen 5-325 MG per tablet  Commonly known as:  PERCOCET/ROXICET  Take 1-2 tablets by mouth every 4 (four) hours as needed (moderate to severe pain (when tolerating fluids)).     sertraline 50 MG tablet  Commonly known as:  ZOLOFT  Take 25 mg by mouth daily.     valACYclovir 500 MG tablet  Commonly known as:  VALTREX  Take 500 mg by mouth as needed.           Follow-up Information    Follow up with Benjaman Kindler, MD. Schedule an appointment as soon as possible for a visit in 2 weeks.   Specialty:  Obstetrics and Gynecology   Why:  For postop check   Contact information:   [address] [place] [address] [phone]       Signed: Benjaman Kindler 07/31/2015, 8:32 AM

## 2015-08-01 LAB — SURGICAL PATHOLOGY

## 2015-08-07 NOTE — Anesthesia Postprocedure Evaluation (Signed)
  Anesthesia Post-op Note  Patient: Carla Green  Procedure(s) Performed: Procedure(s): LAPAROSCOPIC ASSISTED VAGINAL HYSTERECTOMY WITH BILATERAL SALPINGO OOPHORECTOMY AND CYSTOSCOPY (Bilateral)  Anesthesia type:General ETT  Patient location: PACU  Post pain: Pain level controlled  Post assessment: Post-op Vital signs reviewed, Patient's Cardiovascular Status Stable, Respiratory Function Stable, Patent Airway and No signs of Nausea or vomiting  Post vital signs: Reviewed and stable  Last Vitals:  Filed Vitals:   07/31/15 1144  BP: 118/59  Pulse: 73  Temp: 36.7 C  Resp: 18    Level of consciousness: awake, alert  and patient cooperative  Complications: No apparent anesthesia complications

## 2015-08-13 ENCOUNTER — Other Ambulatory Visit: Payer: Self-pay | Admitting: Internal Medicine

## 2015-08-13 ENCOUNTER — Encounter: Payer: Self-pay | Admitting: Internal Medicine

## 2015-08-17 ENCOUNTER — Telehealth: Payer: Self-pay

## 2015-08-17 DIAGNOSIS — E7800 Pure hypercholesterolemia, unspecified: Secondary | ICD-10-CM

## 2015-08-17 DIAGNOSIS — E785 Hyperlipidemia, unspecified: Secondary | ICD-10-CM

## 2015-08-17 NOTE — Telephone Encounter (Signed)
Pt would like to have her cholesterol checked on Tues 10/5. Is this ok? plase call.

## 2015-08-17 NOTE — Telephone Encounter (Signed)
Spoke w/ pt.  Advised her that we can check her lipids at ov, to fast that am.  She is appreciative of the call.

## 2015-08-22 ENCOUNTER — Ambulatory Visit (INDEPENDENT_AMBULATORY_CARE_PROVIDER_SITE_OTHER): Payer: 59 | Admitting: Cardiovascular Disease

## 2015-08-22 ENCOUNTER — Encounter: Payer: Self-pay | Admitting: Cardiovascular Disease

## 2015-08-22 VITALS — BP 130/82 | HR 54 | Ht 62.0 in | Wt 115.5 lb

## 2015-08-22 DIAGNOSIS — R002 Palpitations: Secondary | ICD-10-CM

## 2015-08-22 DIAGNOSIS — R0602 Shortness of breath: Secondary | ICD-10-CM

## 2015-08-22 DIAGNOSIS — E785 Hyperlipidemia, unspecified: Secondary | ICD-10-CM | POA: Diagnosis not present

## 2015-08-22 DIAGNOSIS — I10 Essential (primary) hypertension: Secondary | ICD-10-CM | POA: Diagnosis not present

## 2015-08-22 DIAGNOSIS — R079 Chest pain, unspecified: Secondary | ICD-10-CM

## 2015-08-22 NOTE — Assessment & Plan Note (Signed)
Blood pressure is well controlled on today's visit. No changes made to the medications. 

## 2015-08-22 NOTE — Progress Notes (Signed)
Patient ID: Carla Green, female    DOB: Mar 21, 1961, 54 y.o.   MRN: [id number]  HPI Comments: Carla Green is a very pleasant 54 year old woman, patient of Dr. Army Melia, who presents for follow-up of her palpitations   In follow up today, she has had recent hysterectomy. Continues to have problems with constipation Currently on hormone therapy, trying to wean off and take black Cohosh. Denies having significant palpitations. Improvement of her anxiety Is not taking metoprolol on a regular basis, only as needed Continues on Zoloft  EKG on today's visit shows normal sinus rhythm with rate 71 bpm, no significant ST or T-wave changes  Other past medical history Previous problems with Insomnia. Palpitations more at nighttime. It was listening with a stethoscope Previous Lab work from primary care reviewed hemoglobin A1c 5.2, creatinine 0.73, hematocrit 39, TSH 1.0, total cholesterol 245, LDL 142 EKG from primary care August 2015 showing no ectopy otherwise normal EKG   long history of burning in her legs. Her symptoms seem to flareup more when she walks. She reports after a busy day at work, she is more symptomatic. She has tried Neurontin with no benefit.  She does have significant stressors. Mother passed away in Dec 01, 2013. She helps to take care of her elderly father. Also has in-laws that she helps to take care of. She has some marital issues, also other stress at home, possibly children. This has caused significant anxiety for her She has started working night shifts   Previous stress echo at [facility] March 2011 which was normal. She exercised for 9 minutes, peak heart rate 169 beats per minute, normal echo at rest and stress   Allergies  Allergen Reactions  . Codeine Sulfate Itching  . Duloxetine Nausea Only    Outpatient Encounter Prescriptions as of 08/22/2015  Medication Sig  . ALPRAZolam (XANAX) 0.5 MG tablet TAKE 1 TABLET AT BEDTIME  . BLACK COHOSH PO Take by  mouth daily.  . Calcium-Magnesium-Zinc 167-83-8 MG TABS Take by mouth daily.  Marland Kitchen docusate sodium (COLACE) 100 MG capsule Take 1 capsule (100 mg total) by mouth 2 (two) times daily.  Marland Kitchen ibuprofen (ADVIL,MOTRIN) 800 MG tablet Take 1 tablet (800 mg total) by mouth every 8 (eight) hours as needed for moderate pain. (Patient taking differently: Take 800 mg by mouth every 8 (eight) hours as needed for moderate pain. Takes 0.5 tabs as needed for pain)  . metoprolol succinate (TOPROL-XL) 50 MG 24 hr tablet Take 1 tablet (50 mg total) by mouth daily.  . sertraline (ZOLOFT) 50 MG tablet Take 25 mg by mouth daily.   . valACYclovir (VALTREX) 500 MG tablet TAKE 1 TABLET BY MOUTH EVERY DAY  . [DISCONTINUED] ondansetron (ZOFRAN) 4 MG tablet Take 1 tablet (4 mg total) by mouth every 6 (six) hours as needed for nausea. (Patient not taking: Reported on 08/22/2015)  . [DISCONTINUED] oxyCODONE-acetaminophen (PERCOCET/ROXICET) 5-325 MG per tablet Take 1-2 tablets by mouth every 4 (four) hours as needed (moderate to severe pain (when tolerating fluids)). (Patient not taking: Reported on 08/22/2015)   No facility-administered encounter medications on file as of 08/22/2015.    Past Medical History  Diagnosis Date  . IBS (irritable bowel syndrome)   . Menopausal state   . Neuropathy ([place])   . Mixed hyperlipidemia   . History of kidney stones   . Palpitations   . Complication of anesthesia   . PONV (postoperative nausea and vomiting)   . Dysrhythmia     palpitations  .  Uterine hyperplasia     Past Surgical History  Procedure Laterality Date  . Tonsillectomy    . Hernia repair      femoral  . Leg vein    . Hysteroscopy w/d&c N/A 05/07/2015    Procedure: DILATATION AND CURETTAGE /HYSTEROSCOPY;  Surgeon: Benjaman Kindler, MD;  Location: [facility] ORS;  Service: Gynecology;  Laterality: N/A;  . Esophagogastroduodenoscopy  2013    normal  . Laparoscopic total hysterectomy      Social History  reports that she has  never smoked. She has never used smokeless tobacco. She reports that she does not drink alcohol or use illicit drugs.  Family History family history includes Colon cancer in her father; Heart attack in her father; Heart attack (age of onset: 34) in her paternal grandmother; Heart attack (age of onset: 33) in her paternal uncle; Heart disease in her father and paternal uncle.   Review of Systems  Constitutional: Negative.   Respiratory: Negative.   Cardiovascular: Negative.   Gastrointestinal: Negative.   Musculoskeletal: Negative.   Skin: Negative.   Neurological: Negative.   Hematological: Negative.   Psychiatric/Behavioral: Negative.   All other systems reviewed and are negative.   BP 130/82 mmHg  Pulse 54  Ht 5\' 2"  (1.575 m)  Wt 115 lb 8 oz (52.39 kg)  BMI 21.12 kg/m2  LMP 04/01/2014 (Approximate)  Physical Exam  Constitutional: She is oriented to person, place, and time. She appears well-developed and well-nourished.  HENT:  Head: Normocephalic.  Nose: Nose normal.  Mouth/Throat: Oropharynx is clear and moist.  Eyes: Conjunctivae are normal. Pupils are equal, round, and reactive to light.  Neck: Normal range of motion. Neck supple. No JVD present.  Cardiovascular: Normal rate, regular rhythm, S1 normal, S2 normal, normal heart sounds and intact distal pulses.  Exam reveals no gallop and no friction rub.   No murmur heard. Pulmonary/Chest: Effort normal and breath sounds normal. No respiratory distress. She has no wheezes. She has no rales. She exhibits no tenderness.  Abdominal: Soft. Bowel sounds are normal. She exhibits no distension. There is no tenderness.  Musculoskeletal: Normal range of motion. She exhibits no edema or tenderness.  Lymphadenopathy:    She has no cervical adenopathy.  Neurological: She is alert and oriented to person, place, and time. Coordination normal.  Skin: Skin is warm and dry. No rash noted. No erythema.  Psychiatric: She has a normal mood  and affect. Her behavior is normal. Judgment and thought content normal.    Assessment and Plan  Nursing note and vitals reviewed.

## 2015-08-22 NOTE — Assessment & Plan Note (Signed)
Symptoms significantly improved recently. Currently not taking metoprolol other than as needed for breakthrough symptoms No further workup needed

## 2015-08-22 NOTE — Patient Instructions (Addendum)
You are doing well. No medication changes were made.  Please research "CT coronary calcium score" (check google, google images)  Call if you have chest pains particularly with exertion  Ok to take metoprolol as needed  Please call us if you have new issues that need to be addressed before your next appt.  Your physician wants you to follow-up in: 12 months.  You will receive a reminder letter in the mail two months in advance. If you don't receive a letter, please call our office to schedule the follow-up appointment.

## 2015-11-21 DIAGNOSIS — K5900 Constipation, unspecified: Secondary | ICD-10-CM | POA: Diagnosis not present

## 2015-11-21 DIAGNOSIS — Z800 Family history of malignant neoplasm of digestive organs: Secondary | ICD-10-CM | POA: Diagnosis not present

## 2015-11-21 DIAGNOSIS — R1310 Dysphagia, unspecified: Secondary | ICD-10-CM | POA: Diagnosis not present

## 2015-11-23 ENCOUNTER — Encounter: Payer: Self-pay | Admitting: Internal Medicine

## 2015-11-23 ENCOUNTER — Ambulatory Visit (INDEPENDENT_AMBULATORY_CARE_PROVIDER_SITE_OTHER): Payer: 59 | Admitting: Internal Medicine

## 2015-11-23 VITALS — BP 122/64 | HR 80 | Ht 62.0 in | Wt 119.0 lb

## 2015-11-23 DIAGNOSIS — I10 Essential (primary) hypertension: Secondary | ICD-10-CM | POA: Diagnosis not present

## 2015-11-23 DIAGNOSIS — H109 Unspecified conjunctivitis: Secondary | ICD-10-CM

## 2015-11-23 DIAGNOSIS — Z0000 Encounter for general adult medical examination without abnormal findings: Secondary | ICD-10-CM | POA: Diagnosis not present

## 2015-11-23 DIAGNOSIS — R002 Palpitations: Secondary | ICD-10-CM | POA: Diagnosis not present

## 2015-11-23 DIAGNOSIS — K5900 Constipation, unspecified: Secondary | ICD-10-CM | POA: Diagnosis not present

## 2015-11-23 DIAGNOSIS — F419 Anxiety disorder, unspecified: Secondary | ICD-10-CM

## 2015-11-23 LAB — POCT URINALYSIS DIPSTICK
Bilirubin, UA: NEGATIVE
GLUCOSE UA: NEGATIVE
KETONES UA: NEGATIVE
Leukocytes, UA: NEGATIVE
Nitrite, UA: NEGATIVE
Protein, UA: NEGATIVE
RBC UA: NEGATIVE
UROBILINOGEN UA: 0.2
pH, UA: 6.5

## 2015-11-23 MED ORDER — NEOMYCIN-POLYMYXIN-DEXAMETH 3.5-10000-0.1 OP SUSP: 2.0000 [drp] | Freq: Four times a day (QID) | OPHTHALMIC | Status: DC

## 2015-11-23 NOTE — Progress Notes (Signed)
Date:  11/23/2015   Name:  Carla Green   DOB:  Jan 07, 1961   MRN:  [id number]   Chief Complaint: Annual Exam and Constipation Carla Green is a 55 y.o. female who presents today for her Complete Annual Exam. She feels fairly well. She reports exercising regularly. She reports she is sleeping fairly well. She underwent a hysterectomy in September of this year. She is healed well from that. Is now adjusting to treatment with hormone patches.  Constipation This is a chronic problem. The current episode started more than 1 month ago. The problem is unchanged. Pertinent negatives include no abdominal pain, back pain or fever. She has tried diet changes, fiber, laxatives, mineral oil and stool softeners for the symptoms. The treatment provided no relief (seen yesterday by GI). Her past medical history is significant for irritable bowel syndrome.  Insomnia Primary symptoms: sleep disturbance.  The problem occurs nightly. The problem is unchanged. Past treatments include medication. The treatment provided significant relief.  Palpitations  This is a recurrent problem. The problem occurs intermittently. The problem has been unchanged. Pertinent negatives include no chest pain, dizziness, fever or shortness of breath. She has tried beta blockers for the symptoms. The treatment provided significant relief.  Anxiety Presents for follow-up visit. Symptoms include insomnia. Patient reports no chest pain, dizziness, palpitations or shortness of breath. Symptoms occur occasionally. The quality of sleep is good.   Past treatments include SSRIs. The treatment provided significant relief. Compliance with prior treatments has been good.     Review of Systems  Constitutional: Negative for fever, chills and fatigue.  HENT: Negative for hearing loss.   Eyes: Positive for discharge, redness and itching. Negative for visual disturbance.  Respiratory: Negative for chest tightness and shortness of breath.    Cardiovascular: Negative for chest pain, palpitations and leg swelling.  Gastrointestinal: Positive for constipation. Negative for abdominal pain, blood in stool and abdominal distention.  Genitourinary: Negative for dysuria, urgency and pelvic pain.  Musculoskeletal: Positive for arthralgias (various joint aches and pains that come and go). Negative for back pain.  Neurological: Negative for dizziness, tremors, light-headedness and headaches.  Hematological: Negative for adenopathy. Does not bruise/bleed easily.  Psychiatric/Behavioral: Positive for sleep disturbance. Negative for dysphoric mood. The patient has insomnia.     Patient Active Problem List   Diagnosis Date Noted  . Heart palpitations 08/22/2015  . Anxiety 08/15/2014  . Essential hypertension 08/15/2014  . Adaptive colitis 06/27/2014  . Cannot sleep 06/27/2014  . Neuritis or radiculitis due to rupture of lumbar intervertebral disc 06/27/2014  . Beat, premature ventricular 06/27/2014  . 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 Authorizing Provider  ALPRAZolam Duanne Moron) 0.5 MG tablet TAKE 1 TABLET AT BEDTIME 06/13/15  Yes Glean Hess, MD  BLACK COHOSH PO Take by mouth daily.   Yes Historical Provider, MD  Calcium-Magnesium-Zinc [phone] MG TABS Take by mouth daily.   Yes Historical Provider, MD  docusate sodium (COLACE) 100 MG capsule Take 1 capsule (100 mg total) by mouth 2 (two) times daily. 07/31/15  Yes Benjaman Kindler, MD  estradiol (CLIMARA - DOSED IN MG/24 HR) 0.0375 mg/24hr patch Place 1 patch onto the skin once a week. 11/01/15  Yes Historical Provider, MD  ibuprofen (ADVIL,MOTRIN) 800 MG tablet Take 1 tablet (800 mg total) by mouth every 8 (eight) hours as needed for moderate pain. Patient taking differently: Take 800 mg by mouth every 8 (  eight) hours as needed for moderate pain. Takes 0.5 tabs as needed for pain 05/07/15  Yes Benjaman Kindler, MD    polyethylene glycol powder (GLYCOLAX/MIRALAX) powder Take by mouth. 11/21/15  Yes Historical Provider, MD  sertraline (ZOLOFT) 50 MG tablet Take 25 mg by mouth daily.  11/06/14  Yes Historical Provider, MD  valACYclovir (VALTREX) 500 MG tablet TAKE 1 TABLET BY MOUTH EVERY DAY 08/13/15  Yes Glean Hess, MD  Linaclotide [facility]) 145 MCG CAPS capsule Take 1 capsule by mouth daily. Reported on 11/23/2015 11/21/15 12/21/15  Historical Provider, MD  metoprolol succinate (TOPROL-XL) 50 MG 24 hr tablet Take 1 tablet (50 mg total) by mouth daily. Patient not taking: Reported on 11/23/2015 08/15/14   Minna Merritts, MD    Allergies  Allergen Reactions  . Codeine Sulfate Itching  . Duloxetine Nausea Only    Past Surgical History  Procedure Laterality Date  . Tonsillectomy    . Hernia repair      femoral  . Leg vein    . Hysteroscopy w/d&c N/A 05/07/2015    Procedure: DILATATION AND CURETTAGE /HYSTEROSCOPY;  Surgeon: Benjaman Kindler, MD;  Location: [facility] ORS;  Service: Gynecology;  Laterality: N/A;  . Esophagogastroduodenoscopy  2013    normal  . Laparoscopic total hysterectomy      Social History  Substance Use Topics  . Smoking status: Never Smoker   . Smokeless tobacco: Never Used  . Alcohol Use: No    Medication list has been reviewed and updated.   Physical Exam  Constitutional: She is oriented to person, place, and time. She appears well-developed and well-nourished. No distress.  HENT:  Head: Normocephalic and atraumatic.  Right Ear: Tympanic membrane and ear canal normal.  Left Ear: Tympanic membrane and ear canal normal.  Nose: Right sinus exhibits no maxillary sinus tenderness. Left sinus exhibits no maxillary sinus tenderness.  Mouth/Throat: Uvula is midline and oropharynx is clear and moist.  Eyes: EOM are normal. Right eye exhibits chemosis. Right eye exhibits no discharge. Left eye exhibits chemosis. Left eye exhibits no discharge. No scleral icterus.  Neck: Normal range  of motion. Carotid bruit is not present. No erythema present. No thyromegaly present.  Cardiovascular: Normal rate, regular rhythm, normal heart sounds and normal pulses.   Pulmonary/Chest: Effort normal. No respiratory distress. She has no wheezes. Right breast exhibits no mass, no nipple discharge, no skin change and no tenderness. Left breast exhibits no mass, no nipple discharge, no skin change and no tenderness.  Abdominal: Soft. Bowel sounds are normal. There is no hepatosplenomegaly. There is no tenderness. There is no CVA tenderness.  Musculoskeletal: Normal range of motion.  Lymphadenopathy:    She has no cervical adenopathy.    She has no axillary adenopathy.  Neurological: She is alert and oriented to person, place, and time. She has normal reflexes. No cranial nerve deficit or sensory deficit.  Skin: Skin is warm, dry and intact. No rash noted.  Psychiatric: She has a normal mood and affect. Her speech is normal and behavior is normal. Thought content normal.  Nursing note and vitals reviewed.   BP 122/64 mmHg  Pulse 80  Ht 5\' 2"  (1.575 m)  Wt 119 lb (53.978 kg)  BMI 21.76 kg/m2  LMP 04/01/2014 (Approximate)  Assessment and Plan: 1. Annual physical exam Mammogram is up-to-date Continue hormone replacement therapy and daily calcium supplementation Would recommend bone density scan in 2 years - POCT urinalysis dipstick - Lipid panel  2. Essential hypertension controlled  3. Heart palpitations Stable intermittent symptoms  4. Anxiety Doing well on low-dose sertraline  5. Constipation, unspecified constipation type Recently seen by GI Continue MiraLAX and proceed to Linzess if needed Colonoscopy scheduled for February - CBC with Differential/Platelet - Comprehensive metabolic panel - TSH  6. Bilateral conjunctivitis - neomycin-polymyxin b-dexamethasone (MAXITROL) 3.5-10000-0.1 SUSP; Place 2 drops into both eyes every 6 (six) hours.  Dispense: 5 mL; Refill:  0   Halina Maidens, MD [facility]  11/23/2015

## 2015-11-24 LAB — CBC WITH DIFFERENTIAL/PLATELET
BASOS ABS: 0 10*3/uL (ref 0.0–0.2)
Basos: 1 %
EOS (ABSOLUTE): 0.3 10*3/uL (ref 0.0–0.4)
Eos: 7 %
HEMOGLOBIN: 13.3 g/dL (ref 11.1–15.9)
Hematocrit: 40.9 % (ref 34.0–46.6)
IMMATURE GRANS (ABS): 0 10*3/uL (ref 0.0–0.1)
IMMATURE GRANULOCYTES: 0 %
LYMPHS: 27 %
Lymphocytes Absolute: 1 10*3/uL (ref 0.7–3.1)
MCH: 30.1 pg (ref 26.6–33.0)
MCHC: 32.5 g/dL (ref 31.5–35.7)
MCV: 93 fL (ref 79–97)
MONOCYTES: 7 %
Monocytes Absolute: 0.3 10*3/uL (ref 0.1–0.9)
NEUTROS ABS: 2.2 10*3/uL (ref 1.4–7.0)
NEUTROS PCT: 58 %
PLATELETS: 204 10*3/uL (ref 150–379)
RBC: 4.42 x10E6/uL (ref 3.77–5.28)
RDW: 12.8 % (ref 12.3–15.4)
WBC: 3.8 10*3/uL (ref 3.4–10.8)

## 2015-11-24 LAB — COMPREHENSIVE METABOLIC PANEL
ALBUMIN: 4.6 g/dL (ref 3.5–5.5)
ALT: 20 IU/L (ref 0–32)
AST: 22 IU/L (ref 0–40)
Albumin/Globulin Ratio: 2.1 (ref 1.1–2.5)
Alkaline Phosphatase: 67 IU/L (ref 39–117)
BILIRUBIN TOTAL: 0.8 mg/dL (ref 0.0–1.2)
BUN / CREAT RATIO: 17 (ref 9–23)
BUN: 12 mg/dL (ref 6–24)
CALCIUM: 9.4 mg/dL (ref 8.7–10.2)
CHLORIDE: 99 mmol/L (ref 96–106)
CO2: 25 mmol/L (ref 18–29)
CREATININE: 0.69 mg/dL (ref 0.57–1.00)
GFR calc non Af Amer: 99 mL/min/{1.73_m2} (ref >59)
GFR, EST AFRICAN AMERICAN: 114 mL/min/{1.73_m2} (ref >59)
GLUCOSE: 90 mg/dL (ref 65–99)
Globulin, Total: 2.2 g/dL (ref 1.5–4.5)
Potassium: 4.1 mmol/L (ref 3.5–5.2)
Sodium: 140 mmol/L (ref 134–144)
TOTAL PROTEIN: 6.8 g/dL (ref 6.0–8.5)

## 2015-11-24 LAB — LIPID PANEL
Chol/HDL Ratio: 3.9 ratio units (ref 0.0–4.4)
Cholesterol, Total: 249 mg/dL — ABNORMAL HIGH (ref 100–199)
HDL: 64 mg/dL (ref >39)
LDL CALC: 167 mg/dL — AB (ref 0–99)
Triglycerides: 92 mg/dL (ref 0–149)
VLDL CHOLESTEROL CAL: 18 mg/dL (ref 5–40)

## 2015-11-24 LAB — TSH: TSH: 0.667 u[IU]/mL (ref 0.450–4.500)

## 2015-11-27 ENCOUNTER — Telehealth: Payer: Self-pay

## 2015-11-27 NOTE — Telephone Encounter (Signed)
Spoke with pt. Pt. Advised of all results and verbalized understanding. MAH 

## 2015-11-27 NOTE — Telephone Encounter (Signed)
[email or web address] 

## 2015-11-27 NOTE — Telephone Encounter (Signed)
-----   Message from Glean Hess, MD sent at 11/24/2015  3:02 PM EST ----- Labs are all normal.  Cholesterol is good with high HDL.

## 2015-12-04 DIAGNOSIS — N9989 Other postprocedural complications and disorders of genitourinary system: Secondary | ICD-10-CM | POA: Diagnosis not present

## 2015-12-04 DIAGNOSIS — N760 Acute vaginitis: Secondary | ICD-10-CM | POA: Diagnosis not present

## 2015-12-04 DIAGNOSIS — N842 Polyp of vagina: Secondary | ICD-10-CM | POA: Diagnosis not present

## 2015-12-09 ENCOUNTER — Emergency Department: Payer: 59

## 2015-12-09 DIAGNOSIS — Z79890 Hormone replacement therapy (postmenopausal): Secondary | ICD-10-CM | POA: Diagnosis not present

## 2015-12-09 DIAGNOSIS — Y92002 Bathroom of unspecified non-institutional (private) residence single-family (private) house as the place of occurrence of the external cause: Secondary | ICD-10-CM | POA: Insufficient documentation

## 2015-12-09 DIAGNOSIS — Z792 Long term (current) use of antibiotics: Secondary | ICD-10-CM | POA: Diagnosis not present

## 2015-12-09 DIAGNOSIS — Y998 Other external cause status: Secondary | ICD-10-CM | POA: Diagnosis not present

## 2015-12-09 DIAGNOSIS — S0101XA Laceration without foreign body of scalp, initial encounter: Secondary | ICD-10-CM | POA: Insufficient documentation

## 2015-12-09 DIAGNOSIS — Y9389 Activity, other specified: Secondary | ICD-10-CM | POA: Diagnosis not present

## 2015-12-09 DIAGNOSIS — G44319 Acute post-traumatic headache, not intractable: Secondary | ICD-10-CM | POA: Diagnosis not present

## 2015-12-09 DIAGNOSIS — I10 Essential (primary) hypertension: Secondary | ICD-10-CM | POA: Insufficient documentation

## 2015-12-09 DIAGNOSIS — Z79899 Other long term (current) drug therapy: Secondary | ICD-10-CM | POA: Insufficient documentation

## 2015-12-09 DIAGNOSIS — S0990XA Unspecified injury of head, initial encounter: Secondary | ICD-10-CM | POA: Diagnosis not present

## 2015-12-09 DIAGNOSIS — R51 Headache: Secondary | ICD-10-CM | POA: Diagnosis not present

## 2015-12-09 DIAGNOSIS — W01198A Fall on same level from slipping, tripping and stumbling with subsequent striking against other object, initial encounter: Secondary | ICD-10-CM | POA: Diagnosis not present

## 2015-12-09 NOTE — ED Notes (Signed)
Pt states that she tripped  Hanging up her necklace causing her to fall hitting the rt side of her head on the door. Pt states that her head is hurting and throbbing, pt denies loc, pt states that she just doesn't feel right and didn't want to go to sleep, pt has a lac to the rt side of her head

## 2015-12-10 ENCOUNTER — Emergency Department
Admission: EM | Admit: 2015-12-10 | Discharge: 2015-12-10 | Disposition: A | Discharge status: Home / Self Care | Payer: 59 | Attending: Emergency Medicine | Admitting: Emergency Medicine

## 2015-12-10 DIAGNOSIS — G44319 Acute post-traumatic headache, not intractable: Secondary | ICD-10-CM

## 2015-12-10 DIAGNOSIS — S0101XA Laceration without foreign body of scalp, initial encounter: Secondary | ICD-10-CM

## 2015-12-10 DIAGNOSIS — Z79890 Hormone replacement therapy (postmenopausal): Secondary | ICD-10-CM | POA: Diagnosis not present

## 2015-12-10 DIAGNOSIS — Z79899 Other long term (current) drug therapy: Secondary | ICD-10-CM | POA: Diagnosis not present

## 2015-12-10 DIAGNOSIS — Z792 Long term (current) use of antibiotics: Secondary | ICD-10-CM | POA: Diagnosis not present

## 2015-12-10 DIAGNOSIS — S0990XA Unspecified injury of head, initial encounter: Secondary | ICD-10-CM

## 2015-12-10 DIAGNOSIS — I10 Essential (primary) hypertension: Secondary | ICD-10-CM | POA: Diagnosis not present

## 2015-12-10 MED ORDER — LIDOCAINE-EPINEPHRINE-TETRACAINE (LET) SOLUTION
3.0000 mL | Freq: Once | NASAL | Status: AC
  Administered 2015-12-10: 3 mL via TOPICAL

## 2015-12-10 MED ORDER — LIDOCAINE-EPINEPHRINE-TETRACAINE (LET) SOLUTION
NASAL | Status: AC
  Administered 2015-12-10: 3 mL via TOPICAL
  Filled 2015-12-10: qty 3

## 2015-12-10 NOTE — Discharge Instructions (Signed)
Concussion, Adult A concussion, or closed-head injury, is a brain injury caused by a direct blow to the head or by a quick and sudden movement (jolt) of the head or neck. Concussions are usually not life-threatening. Even so, the effects of a concussion can be serious. If you have had a concussion before, you are more likely to experience concussion-like symptoms after a direct blow to the head.  CAUSES  Direct blow to the head, such as from running into another player during a soccer game, being hit in a fight, or hitting your head on a hard surface.  A jolt of the head or neck that causes the brain to move back and forth inside the skull, such as in a car crash. SIGNS AND SYMPTOMS The signs of a concussion can be hard to notice. Early on, they may be missed by you, family members, and health care providers. You may look fine but act or feel differently. Symptoms are usually temporary, but they may last for days, weeks, or even longer. Some symptoms may appear right away while others may not show up for hours or days. Every head injury is different. Symptoms include:  Mild to moderate headaches that will not go away.  A feeling of pressure inside your head.  Having more trouble than usual:  Learning or remembering things you have heard.  Answering questions.  Paying attention or concentrating.  Organizing daily tasks.  Making decisions and solving problems.  Slowness in thinking, acting or reacting, speaking, or reading.  Getting lost or being easily confused.  Feeling tired all the time or lacking energy (fatigued).  Feeling drowsy.  Sleep disturbances.  Sleeping more than usual.  Sleeping less than usual.  Trouble falling asleep.  Trouble sleeping (insomnia).  Loss of balance or feeling lightheaded or dizzy.  Nausea or vomiting.  Numbness or tingling.  Increased sensitivity to:  Sounds.  Lights.  Distractions.  Vision problems or eyes that tire  easily.  Diminished sense of taste or smell.  Ringing in the ears.  Mood changes such as feeling sad or anxious.  Becoming easily irritated or angry for little or no reason.  Lack of motivation.  Seeing or hearing things other people do not see or hear (hallucinations). DIAGNOSIS Your health care provider can usually diagnose a concussion based on a description of your injury and symptoms. He or she will ask whether you passed out (lost consciousness) and whether you are having trouble remembering events that happened right before and during your injury. Your evaluation might include:  A brain scan to look for signs of injury to the brain. Even if the test shows no injury, you may still have a concussion.  Blood tests to be sure other problems are not present. TREATMENT  Concussions are usually treated in an emergency department, in urgent care, or at a clinic. You may need to stay in the hospital overnight for further treatment.  Tell your health care provider if you are taking any medicines, including prescription medicines, over-the-counter medicines, and natural remedies. Some medicines, such as blood thinners (anticoagulants) and aspirin, may increase the chance of complications. Also tell your health care provider whether you have had alcohol or are taking illegal drugs. This information may affect treatment.  Your health care provider will send you home with important instructions to follow.  How fast you will recover from a concussion depends on many factors. These factors include how severe your concussion is, what part of your brain was injured,  your age, and how healthy you were before the concussion.  Most people with mild injuries recover fully. Recovery can take time. In general, recovery is slower in older persons. Also, persons who have had a concussion in the past or have other medical problems may find that it takes longer to recover from their current injury. HOME  CARE INSTRUCTIONS General Instructions  Carefully follow the directions your health care provider gave you.  Only take over-the-counter or prescription medicines for pain, discomfort, or fever as directed by your health care provider.  Take only those medicines that your health care provider has approved.  Do not drink alcohol until your health care provider says you are well enough to do so. Alcohol and certain other drugs may slow your recovery and can put you at risk of further injury.  If it is harder than usual to remember things, write them down.  If you are easily distracted, try to do one thing at a time. For example, do not try to watch TV while fixing dinner.  Talk with family members or close friends when making important decisions.  Keep all follow-up appointments. Repeated evaluation of your symptoms is recommended for your recovery.  Watch your symptoms and tell others to do the same. Complications sometimes occur after a concussion. Older adults with a brain injury may have a higher risk of serious complications, such as a blood clot on the brain.  Tell your teachers, school nurse, school counselor, coach, athletic trainer, or work Freight forwarder about your injury, symptoms, and restrictions. Tell them about what you can or cannot do. They should watch for:  Increased problems with attention or concentration.  Increased difficulty remembering or learning new information.  Increased time needed to complete tasks or assignments.  Increased irritability or decreased ability to cope with stress.  Increased symptoms.  Rest. Rest helps the brain to heal. Make sure you:  Get plenty of sleep at night. Avoid staying up late at night.  Keep the same bedtime hours on weekends and weekdays.  Rest during the day. Take daytime naps or rest breaks when you feel tired.  Limit activities that require a lot of thought or concentration. These include:  Doing homework or job-related  work.  Watching TV.  Working on the computer.  Avoid any situation where there is potential for another head injury (football, hockey, soccer, basketball, martial arts, downhill snow sports and horseback riding). Your condition will get worse every time you experience a concussion. You should avoid these activities until you are evaluated by the appropriate follow-up health care providers. Returning To Your Regular Activities You will need to return to your normal activities slowly, not all at once. You must give your body and brain enough time for recovery.  Do not return to sports or other athletic activities until your health care provider tells you it is safe to do so.  Ask your health care provider when you can drive, ride a bicycle, or operate heavy machinery. Your ability to react may be slower after a brain injury. Never do these activities if you are dizzy.  Ask your health care provider about when you can return to work or school. Preventing Another Concussion It is very important to avoid another brain injury, especially before you have recovered. In rare cases, another injury can lead to permanent brain damage, brain swelling, or death. The risk of this is greatest during the first 7-10 days after a head injury. Avoid injuries by:  Wearing a  your health care provider when you can drive, ride a bicycle, or operate heavy machinery. Your ability to react may be slower after a brain injury. Never do these activities if you are dizzy.   Ask your health care provider about when you can return to work or school.  Preventing Another Concussion  It is very important to avoid another brain injury, especially before you have recovered. In rare cases, another injury can lead to permanent brain damage, brain swelling, or death. The risk of this is greatest during the first 7-10 days after a head injury. Avoid injuries by:   Wearing a seat belt when riding in a car.   Drinking alcohol only in moderation.   Wearing a helmet when biking, skiing, skateboarding, skating, or doing similar activities.   Avoiding activities that could lead to a second concussion, such as contact or recreational sports, until your health care provider says it is okay.   Taking safety measures in your home.    Remove clutter and tripping hazards from floors and stairways.    Use grab bars in bathrooms and handrails by stairs.    Place non-slip mats on floors and in bathtubs.    Improve lighting in dim areas.  SEEK MEDICAL CARE IF:   You have increased problems paying attention or  concentrating.   You have increased difficulty remembering or learning new information.   You need more time to complete tasks or assignments than before.   You have increased irritability or decreased ability to cope with stress.   You have more symptoms than before.  Seek medical care if you have any of the following symptoms for more than 2 weeks after your injury:   Lasting (chronic) headaches.   Dizziness or balance problems.   Nausea.   Vision problems.   Increased sensitivity to noise or light.   Depression or mood swings.   Anxiety or irritability.   Memory problems.   Difficulty concentrating or paying attention.   Sleep problems.   Feeling tired all the time.  SEEK IMMEDIATE MEDICAL CARE IF:   You have severe or worsening headaches. These may be a sign of a blood clot in the brain.   You have weakness (even if only in one hand, leg, or part of the face).   You have numbness.   You have decreased coordination.   You vomit repeatedly.   You have increased sleepiness.   One pupil is larger than the other.   You have convulsions.   You have slurred speech.   You have increased confusion. This may be a sign of a blood clot in the brain.   You have increased restlessness, agitation, or irritability.   You are unable to recognize people or places.   You have neck pain.   It is difficult to wake you up.   You have unusual behavior changes.   You lose consciousness.  MAKE SURE YOU:   Understand these instructions.   Will watch your condition.   Will get help right away if you are not doing well or get worse.     This information is not intended to replace advice given to you by your health care provider. Make sure you discuss any questions you have with your health care provider.     Document Released: 01/24/2004 Document Revised: 11/24/2014 Document Reviewed: 05/26/2013  Elsevier Interactive Patient Education 2016 [employer]  Head Injury, Adult  You have received a head  injury. It does not   in the brain that collects, clots, and forms a bump (hematoma). WHAT CAUSES A HEAD INJURY? A serious head injury is most likely to happen to someone who is in a car wreck and is not wearing a seat belt. Other causes of major head injuries include bicycle or motorcycle accidents, sports injuries, and falls. HOW ARE HEAD INJURIES DIAGNOSED? A complete history of the event leading to the injury and your current symptoms will be helpful in diagnosing head injuries. Many times, pictures of the brain, such as CT or MRI are needed to see the extent of the injury. Often, an overnight hospital stay is necessary for observation.  WHEN SHOULD I SEEK IMMEDIATE MEDICAL CARE?  You should get help right away if:  You have confusion or drowsiness.  You feel sick to your stomach (nauseous) or have continued, forceful vomiting.  You have dizziness or unsteadiness that is getting worse.  You have severe, continued headaches not relieved by medicine. Only take over-the-counter or  prescription medicines for pain, fever, or discomfort as directed by your health care provider.  You do not have normal function of the arms or legs or are unable to walk.  You notice changes in the black spots in the center of the colored part of your eye (pupil).  You have a clear or bloody fluid coming from your nose or ears.  You have a loss of vision. During the next 24 hours after the injury, you must stay with someone who can watch you for the warning signs. This person should contact local emergency services (911 in the [place] if you have seizures, you become unconscious, or you are unable to wake up. HOW CAN I PREVENT A HEAD INJURY IN THE FUTURE? The most important factor for preventing major head injuries is avoiding motor vehicle accidents. To minimize the potential for damage to your head, it is crucial to wear seat belts while riding in motor vehicles. Wearing helmets while bike riding and playing collision sports (like football) is also helpful. Also, avoiding dangerous activities around the house will further help reduce your risk of head injury.  WHEN CAN I RETURN TO NORMAL ACTIVITIES AND ATHLETICS? You should be reevaluated by your health care provider before returning to these activities. If you have any of the following symptoms, you should not return to activities or contact sports until 1 week after the symptoms have stopped:  Persistent headache.  Dizziness or vertigo.  Poor attention and concentration.  Confusion.  Memory problems.  Nausea or vomiting.  Fatigue or tire easily.  Irritability.  Intolerant of bright lights or loud noises.  Anxiety or depression.  Disturbed sleep. MAKE SURE YOU:   Understand these instructions.  Will watch your condition.  Will get help right away if you are not doing well or get worse.   This information is not intended to replace advice given to you by your health care provider. Make sure you discuss any questions you  have with your health care provider.   Document Released: 11/03/2005 Document Revised: 11/24/2014 Document Reviewed: 07/11/2013 Elsevier Interactive Patient Education 2016 [place], Adult A laceration is a cut that goes through all of the layers of the skin and into the tissue that is right under the skin. Some lacerations heal on their own. Others need to be closed with stitches (sutures), staples, skin adhesive strips, or skin glue. Proper laceration care minimizes the risk of infection and helps the laceration to heal better. HOW TO CARE  FOR YOUR LACERATION If sutures or staples were used:  Keep the wound clean and dry.  If you were given a bandage (dressing), you should change it at least one time per day or as told by your health care provider. You should also change it if it becomes wet or dirty.  Keep the wound completely dry for the first 24 hours or as told by your health care provider. After that time, you may shower or bathe. However, make sure that the wound is not soaked in water until after the sutures or staples have been removed.  Clean the wound one time each day or as told by your health care provider:  Wash the wound with soap and water.  Rinse the wound with water to remove all soap.  Pat the wound dry with a clean towel. Do not rub the wound.  After cleaning the wound, apply a thin layer of antibiotic ointmentas told by your health care provider. This will help to prevent infection and keep the dressing from sticking to the wound.  Have the sutures or staples removed as told by your health care provider. If skin adhesive strips were used:  Keep the wound clean and dry.  If you were given a bandage (dressing), you should change it at least one time per day or as told by your health care provider. You should also change it if it becomes dirty or wet.  Do not get the skin adhesive strips wet. You may shower or bathe, but be careful to keep  the wound dry.  If the wound gets wet, pat it dry with a clean towel. Do not rub the wound.  Skin adhesive strips fall off on their own. You may trim the strips as the wound heals. Do not remove skin adhesive strips that are still stuck to the wound. They will fall off in time. If skin glue was used:  Try to keep the wound dry, but you may briefly wet it in the shower or bath. Do not soak the wound in water, such as by swimming.  After you have showered or bathed, gently pat the wound dry with a clean towel. Do not rub the wound.  Do not do any activities that will make you sweat heavily until the skin glue has fallen off on its own.  Do not apply liquid, cream, or ointment medicine to the wound while the skin glue is in place. Using those may loosen the film before the wound has healed.  If you were given a bandage (dressing), you should change it at least one time per day or as told by your health care provider. You should also change it if it becomes dirty or wet.  If a dressing is placed over the wound, be careful not to apply tape directly over the skin glue. Doing that may cause the glue to be pulled off before the wound has healed.  Do not pick at the glue. The skin glue usually remains in place for 5-10 days, then it falls off of the skin. General Instructions  Take over-the-counter and prescription medicines only as told by your health care provider.  If you were prescribed an antibiotic medicine or ointment, take or apply it as told by your doctor. Do not stop using it even if your condition improves.  To help prevent scarring, make sure to cover your wound with sunscreen whenever you are outside after stitches are removed, after adhesive strips are removed, or when glue  remains in place and the wound is healed. Make sure to wear a sunscreen of at least 30 SPF.  Do not scratch or pick at the wound.  Keep all follow-up visits as told by your health care provider. This is  important.  Check your wound every day for signs of infection. Watch for:  Redness, swelling, or pain.  Fluid, blood, or pus.  Raise (elevate) the injured area above the level of your heart while you are sitting or lying down, if possible. SEEK MEDICAL CARE IF:  You received a tetanus shot and you have swelling, severe pain, redness, or bleeding at the injection site.  You have a fever.  A wound that was closed breaks open.  You notice a bad smell coming from your wound or your dressing.  You notice something coming out of the wound, such as wood or glass.  Your pain is not controlled with medicine.  You have increased redness, swelling, or pain at the site of your wound.  You have fluid, blood, or pus coming from your wound.  You notice a change in the color of your skin near your wound.  You need to change the dressing frequently due to fluid, blood, or pus draining from the wound.  You develop a new rash.  You develop numbness around the wound. SEEK IMMEDIATE MEDICAL CARE IF:  You develop severe swelling around the wound.  Your pain suddenly increases and is severe.  You develop painful lumps near the wound or on skin that is anywhere on your body.  You have a red streak going away from your wound.  The wound is on your hand or foot and you cannot properly move a finger or toe.  The wound is on your hand or foot and you notice that your fingers or toes look pale or bluish.   This information is not intended to replace advice given to you by your health care provider. Make sure you discuss any questions you have with your health care provider.   Document Released: 11/03/2005 Document Revised: 03/20/2015 Document Reviewed: 10/30/2014 Elsevier Interactive Patient Education 2016 [employer]  Nonsutured Laceration Care A laceration is a cut that goes through all layers of the skin and extends into the tissue that is right under the skin. This type of cut is  usually stitched up (sutured) or closed with tape (adhesive strips) or skin glue shortly after the injury happens. However, if the wound is dirty or if several hours pass before medical treatment is provided, it is likely that germs (bacteria) will enter the wound. Closing a laceration after bacteria have entered it increases the risk of infection. In these cases, your health care provider may leave the laceration open (nonsutured) and cover it with a bandage. This type of treatment helps prevent infection and allows the wound to heal from the deepest layer of tissue damage up to the surface. An open fracture is a type of injury that may involve nonsutured lacerations. An open fracture is a break in a bone that happens along with one or more lacerations through the skin that is near the fracture site. HOW TO CARE FOR YOUR NONSUTURED LACERATION  Take or apply over-the-counter and prescription medicines only as told by your health care provider.  If you were prescribed an antibiotic medicine, take or apply it as told by your health care provider. Do not stop using the antibiotic even if your condition improves.  Clean the wound one time each day or  as told by your health care provider.  Wash the wound with mild soap and water.  Rinse the wound with water to remove all soap.  Pat your wound dry with a clean towel. Do not rub the wound.  Do not inject anything into the wound unless your health care provider told you to.  Change any bandages (dressings) as told by your health care provider. This includes changing the dressing if it gets wet, dirty, or starts to smell bad.  Keep the dressing dry until your health care provider says it can be removed. Do not take baths, swim, or do anything that puts your wound underwater until your health care provider approves.  Raise (elevate) the injured area above the level of your heart while you are sitting or lying down, if possible.  Do not scratch or pick  at the wound.  Check your wound every day for signs of infection. Watch for:  Redness, swelling, or pain.  Fluid, blood, or pus.  Keep all follow-up visits as told by your health care provider. This is important. SEEK MEDICAL CARE IF:  You received a tetanus and shot and you have swelling, severe pain, redness, or bleeding at the injection site.   You have a fever.  Your pain is not controlled with medicine.  You have increased redness, swelling, or pain at the site of your wound.  You have fluid, blood, or pus coming from your wound.  You notice a bad smell coming from your wound or your dressing.  You notice something coming out of the wound, such as wood or glass.  You notice a change in the color of your skin near your wound.  You develop a new rash.  You need to change the dressing frequently due to fluid, blood, or pus draining from the wound.  You develop numbness around your wound. SEEK IMMEDIATE MEDICAL CARE IF:  Your pain suddenly increases and is severe.  You develop severe swelling around the wound.  The wound is on your hand or foot and you cannot properly move a finger or toe.  The wound is on your hand or foot and you notice that your fingers or toes look pale or bluish.  You have a red streak going away from your wound.   This information is not intended to replace advice given to you by your health care provider. Make sure you discuss any questions you have with your health care provider.   Document Released: 10/01/2006 Document Revised: 03/20/2015 Document Reviewed: 10/30/2014 Elsevier Interactive Patient Education [employer].

## 2015-12-10 NOTE — ED Provider Notes (Signed)
[facility] Emergency Department Provider Note  ____________________________________________  Time seen: Approximately 247 AM  I have reviewed the triage vital signs and the nursing notes.   HISTORY  Chief Complaint Head Injury    HPI Carla Green is a 55 y.o. female who comes into the hospital after a fall in the bathroom. The patient reports she was hanging a necklace on her towel rack and standing on the top of her tongue. She reports that she is unsure exactly what happened but she fell into a doorway and hit her head on a door frame. She denies a loss of consciousness but reports that she did see stars. The patient has not had any vomiting but does have some headache. The patient did not take anything for the headache and rates her pain a 3 out of 10 in intensity. The patient reports that there was a lot of bleeding and she thought there was a possible aspiration so she decided to come into the hospital for evaluation. The patient has not hit any other parts of her body and has no other complaints at this time. She has no neck pain.   Past Medical History  Diagnosis Date  . IBS (irritable bowel syndrome)   . Menopausal state   . Neuropathy ([place])   . Mixed hyperlipidemia   . History of kidney stones   . Palpitations   . Complication of anesthesia   . PONV (postoperative nausea and vomiting)   . Dysrhythmia     palpitations  . Uterine hyperplasia     Patient Active Problem List   Diagnosis Date Noted  . Heart palpitations 08/22/2015  . Anxiety 08/15/2014  . Essential hypertension 08/15/2014  . Irritable bowel syndrome with constipation 06/27/2014  . Cannot sleep 06/27/2014  . Neuritis or radiculitis due to rupture of lumbar intervertebral disc 06/27/2014  . Beat, premature ventricular 06/27/2014  . Phlebectasia 06/27/2014    Past Surgical History  Procedure Laterality Date  . Tonsillectomy    . Hernia repair      femoral  . Leg vein     . Hysteroscopy w/d&c N/A 05/07/2015    Procedure: DILATATION AND CURETTAGE /HYSTEROSCOPY;  Surgeon: Benjaman Kindler, MD;  Location: [facility] ORS;  Service: Gynecology;  Laterality: N/A;  . Esophagogastroduodenoscopy  2013    normal  . Laparoscopic total hysterectomy      Current Outpatient Rx  Name  Route  Sig  Dispense  Refill  . ALPRAZolam (XANAX) 0.5 MG tablet      TAKE 1 TABLET AT BEDTIME   30 tablet   5     This request is for a new prescription for a contr ...   . BLACK COHOSH PO   Oral   Take by mouth daily.         . Calcium-Magnesium-Zinc [id number] MG TABS   Oral   Take by mouth daily.         Marland Kitchen docusate sodium (COLACE) 100 MG capsule   Oral   Take 1 capsule (100 mg total) by mouth 2 (two) times daily.   10 capsule   0   . estradiol (CLIMARA - DOSED IN MG/24 HR) 0.0375 mg/24hr patch   Transdermal   Place 1 patch onto the skin once a week.      11   . ibuprofen (ADVIL,MOTRIN) 800 MG tablet   Oral   Take 1 tablet (800 mg total) by mouth every 8 (eight) hours as needed for moderate pain.  Patient taking differently: Take 800 mg by mouth every 8 (eight) hours as needed for moderate pain. Takes 0.5 tabs as needed for pain   30 tablet   0   . metoprolol succinate (TOPROL-XL) 50 MG 24 hr tablet   Oral   Take 1 tablet (50 mg total) by mouth daily.   90 tablet   3   . sertraline (ZOLOFT) 50 MG tablet   Oral   Take 25 mg by mouth daily.       5   . Linaclotide (LINZESS) 145 MCG CAPS capsule   Oral   Take 1 capsule by mouth daily. Reported on 11/23/2015         . neomycin-polymyxin b-dexamethasone (MAXITROL) 3.5-10000-0.1 SUSP   Both Eyes   Place 2 drops into both eyes every 6 (six) hours.   5 mL   0   . polyethylene glycol powder (GLYCOLAX/MIRALAX) powder   Oral   Take by mouth.         . valACYclovir (VALTREX) 500 MG tablet      TAKE 1 TABLET BY MOUTH EVERY DAY   30 tablet   2     Allergies Codeine sulfate and Duloxetine  Family  History  Problem Relation Age of Onset  . Heart attack Father     77  . Heart disease Father     CABG  . Heart attack Paternal Uncle 59  . Heart disease Paternal Uncle     CABG x 4   . Heart attack Paternal Grandmother 25  . Colon cancer Father     Social History Social History  Substance Use Topics  . Smoking status: Never Smoker   . Smokeless tobacco: Never Used  . Alcohol Use: No    Review of Systems Constitutional: No fever/chills Eyes: No visual changes. ENT: No sore throat. Cardiovascular: Denies chest pain. Respiratory: Denies shortness of breath. Gastrointestinal: No abdominal pain.  No nausea, no vomiting.  No diarrhea.  No constipation. Genitourinary: Negative for dysuria. Musculoskeletal: Negative for back pain. Skin: laceration Neurological: headache  10-point ROS otherwise negative.  ____________________________________________   PHYSICAL EXAM:  VITAL SIGNS: ED Triage Vitals  Enc Vitals Group     BP 12/09/15 2319 189/90 mmHg     Pulse Rate 12/09/15 2319 99     Resp 12/09/15 2319 18     Temp 12/09/15 2319 98.2 F (36.8 C)     Temp Source 12/09/15 2319 Oral     SpO2 12/09/15 2319 100 %     Weight 12/09/15 2319 115 lb (52.164 kg)     Height 12/09/15 2319 5\' 2"  (1.575 m)     Head Cir --      Peak Flow --      Pain Score 12/09/15 2319 4     Pain Loc --      Pain Edu? --      Excl. in [place]? --     Constitutional: Alert and oriented. Well appearing and in moderate distress. Eyes: Conjunctivae are normal. PERRL. EOMI. Head: superficial laceration to right parietal lobe Nose: No congestion/rhinnorhea. Mouth/Throat: Mucous membranes are moist.  Oropharynx non-erythematous. Neck: No cervical spine tenderness to palpation. Cardiovascular: Normal rate, regular rhythm. Grossly normal heart sounds.  Good peripheral circulation. Respiratory: Normal respiratory effort.  No retractions. Lungs CTAB. Gastrointestinal: Soft and nontender. No distention.  Positive bowel sounds Musculoskeletal: No lower extremity tenderness nor edema.   Neurologic:  Normal speech and language. No gross focal neurologic deficits are appreciated.  Skin:  Superficial laceration to right parietal lobe Psychiatric: Mood and affect are normal.   ____________________________________________   LABS (all labs ordered are listed, but only abnormal results are displayed)  Labs Reviewed - No data to display ____________________________________________  EKG  none ____________________________________________  RADIOLOGY  CT head: No acute intracranial abnormalities ____________________________________________   PROCEDURES  Procedure(s) performed: None  Critical Care performed: No  ____________________________________________   INITIAL IMPRESSION / ASSESSMENT AND PLAN / ED COURSE  Pertinent labs & imaging results that were available during my care of the patient were reviewed by me and considered in my medical decision making (see chart for details).  This is a 55 year old female who comes into the hospital today after a fall in her bathroom. The patient did not have any syncope but did have some bleeding. The patient does not need to have staples placed due to the superficial nature of the laceration. Since the CT of the patient's head is unremarkable the patient will be discharged home to follow up with her primary care physician.  ____________________________________________   FINAL CLINICAL IMPRESSION(S) / ED DIAGNOSES  Final diagnoses:  Head injury, initial encounter  Scalp laceration, initial encounter  Acute post-traumatic headache, not intractable      Loney Hering, MD 12/10/15 [phone]

## 2015-12-18 ENCOUNTER — Telehealth: Payer: Self-pay | Admitting: *Deleted

## 2015-12-18 DIAGNOSIS — E785 Hyperlipidemia, unspecified: Secondary | ICD-10-CM

## 2015-12-18 DIAGNOSIS — R079 Chest pain, unspecified: Secondary | ICD-10-CM

## 2015-12-18 NOTE — Telephone Encounter (Signed)
Patient called and would like to schedule the "CT coronary calcium score". Please call patient. Thanks!

## 2015-12-18 NOTE — Telephone Encounter (Signed)
Spoke w/ pt. She is sched for CT Calcium Score on Friday, Feb 9 @ 11:00.  Pt given address and reminded that ins does not cover, there is a one time fee of $150 and to arrive 15 mins prior to her appt.  Asked her to call back if she will be unable to keep this appt.

## 2015-12-21 DIAGNOSIS — M9903 Segmental and somatic dysfunction of lumbar region: Secondary | ICD-10-CM | POA: Diagnosis not present

## 2015-12-21 DIAGNOSIS — M955 Acquired deformity of pelvis: Secondary | ICD-10-CM | POA: Diagnosis not present

## 2015-12-21 DIAGNOSIS — M9905 Segmental and somatic dysfunction of pelvic region: Secondary | ICD-10-CM | POA: Diagnosis not present

## 2015-12-21 DIAGNOSIS — M5431 Sciatica, right side: Secondary | ICD-10-CM | POA: Diagnosis not present

## 2015-12-26 DIAGNOSIS — M9903 Segmental and somatic dysfunction of lumbar region: Secondary | ICD-10-CM | POA: Diagnosis not present

## 2015-12-26 DIAGNOSIS — M9905 Segmental and somatic dysfunction of pelvic region: Secondary | ICD-10-CM | POA: Diagnosis not present

## 2015-12-26 DIAGNOSIS — M955 Acquired deformity of pelvis: Secondary | ICD-10-CM | POA: Diagnosis not present

## 2015-12-26 DIAGNOSIS — M5431 Sciatica, right side: Secondary | ICD-10-CM | POA: Diagnosis not present

## 2015-12-27 ENCOUNTER — Ambulatory Visit (INDEPENDENT_AMBULATORY_CARE_PROVIDER_SITE_OTHER)
Admission: RE | Admit: 2015-12-27 | Discharge: 2015-12-27 | Disposition: A | Discharge status: Home / Self Care | Payer: Self-pay | Source: Ambulatory Visit | Attending: Cardiovascular Disease | Admitting: Cardiovascular Disease

## 2015-12-27 DIAGNOSIS — E785 Hyperlipidemia, unspecified: Secondary | ICD-10-CM

## 2015-12-27 DIAGNOSIS — R079 Chest pain, unspecified: Secondary | ICD-10-CM

## 2016-01-01 ENCOUNTER — Other Ambulatory Visit: Payer: Self-pay

## 2016-01-01 MED ORDER — ALPRAZOLAM 0.5 MG PO TABS: 0.5000 mg | ORAL_TABLET | Freq: Every day | ORAL | Status: DC

## 2016-01-01 NOTE — Telephone Encounter (Signed)
Patient states that she needs medication sent in to employee pharmacy.

## 2016-01-08 ENCOUNTER — Other Ambulatory Visit: Payer: Self-pay

## 2016-01-08 ENCOUNTER — Other Ambulatory Visit: Payer: Self-pay | Admitting: Internal Medicine

## 2016-01-08 NOTE — Telephone Encounter (Signed)
This was faxed last week

## 2016-01-10 ENCOUNTER — Encounter: Payer: Self-pay | Admitting: *Deleted

## 2016-01-11 ENCOUNTER — Ambulatory Visit
Admission: RE | Admit: 2016-01-11 | Discharge: 2016-01-11 | Disposition: A | Discharge status: Home / Self Care | Payer: 59 | Source: Ambulatory Visit | Attending: Gastroenterology | Admitting: Gastroenterology

## 2016-01-11 ENCOUNTER — Encounter: Payer: Self-pay | Admitting: *Deleted

## 2016-01-11 ENCOUNTER — Ambulatory Visit: Payer: 59 | Admitting: Anesthesiology

## 2016-01-11 ENCOUNTER — Encounter
Admission: RE | Disposition: A | Discharge status: Home / Self Care | Payer: Self-pay | Source: Ambulatory Visit | Attending: Gastroenterology

## 2016-01-11 DIAGNOSIS — I493 Ventricular premature depolarization: Secondary | ICD-10-CM | POA: Diagnosis not present

## 2016-01-11 DIAGNOSIS — Z90710 Acquired absence of both cervix and uterus: Secondary | ICD-10-CM | POA: Insufficient documentation

## 2016-01-11 DIAGNOSIS — K449 Diaphragmatic hernia without obstruction or gangrene: Secondary | ICD-10-CM | POA: Diagnosis not present

## 2016-01-11 DIAGNOSIS — I10 Essential (primary) hypertension: Secondary | ICD-10-CM | POA: Insufficient documentation

## 2016-01-11 DIAGNOSIS — Z79899 Other long term (current) drug therapy: Secondary | ICD-10-CM | POA: Insufficient documentation

## 2016-01-11 DIAGNOSIS — Z885 Allergy status to narcotic agent status: Secondary | ICD-10-CM | POA: Diagnosis not present

## 2016-01-11 DIAGNOSIS — Z87442 Personal history of urinary calculi: Secondary | ICD-10-CM | POA: Diagnosis not present

## 2016-01-11 DIAGNOSIS — E782 Mixed hyperlipidemia: Secondary | ICD-10-CM | POA: Insufficient documentation

## 2016-01-11 DIAGNOSIS — G629 Polyneuropathy, unspecified: Secondary | ICD-10-CM | POA: Insufficient documentation

## 2016-01-11 DIAGNOSIS — K5900 Constipation, unspecified: Secondary | ICD-10-CM | POA: Insufficient documentation

## 2016-01-11 DIAGNOSIS — K589 Irritable bowel syndrome without diarrhea: Secondary | ICD-10-CM | POA: Insufficient documentation

## 2016-01-11 DIAGNOSIS — F329 Major depressive disorder, single episode, unspecified: Secondary | ICD-10-CM | POA: Diagnosis not present

## 2016-01-11 DIAGNOSIS — R1310 Dysphagia, unspecified: Secondary | ICD-10-CM | POA: Diagnosis not present

## 2016-01-11 DIAGNOSIS — G4700 Insomnia, unspecified: Secondary | ICD-10-CM | POA: Insufficient documentation

## 2016-01-11 DIAGNOSIS — Z1211 Encounter for screening for malignant neoplasm of colon: Secondary | ICD-10-CM | POA: Diagnosis not present

## 2016-01-11 DIAGNOSIS — F419 Anxiety disorder, unspecified: Secondary | ICD-10-CM | POA: Insufficient documentation

## 2016-01-11 DIAGNOSIS — Z888 Allergy status to other drugs, medicaments and biological substances status: Secondary | ICD-10-CM | POA: Diagnosis not present

## 2016-01-11 DIAGNOSIS — K2970 Gastritis, unspecified, without bleeding: Secondary | ICD-10-CM | POA: Insufficient documentation

## 2016-01-11 DIAGNOSIS — Z800 Family history of malignant neoplasm of digestive organs: Secondary | ICD-10-CM | POA: Diagnosis not present

## 2016-01-11 HISTORY — DX: Major depressive disorder, single episode, unspecified: F32.9

## 2016-01-11 HISTORY — PX: COLONOSCOPY WITH PROPOFOL: SHX5780

## 2016-01-11 HISTORY — DX: Insomnia, unspecified: G47.00

## 2016-01-11 HISTORY — DX: Depression, unspecified: F32.A

## 2016-01-11 HISTORY — DX: Varicella without complication: B01.9

## 2016-01-11 HISTORY — DX: Anxiety disorder, unspecified: F41.9

## 2016-01-11 HISTORY — DX: Asymptomatic varicose veins of unspecified lower extremity: I83.90

## 2016-01-11 HISTORY — DX: Ventricular premature depolarization: I49.3

## 2016-01-11 HISTORY — PX: ESOPHAGOGASTRODUODENOSCOPY (EGD) WITH PROPOFOL: SHX5813

## 2016-01-11 SURGERY — COLONOSCOPY WITH PROPOFOL
Anesthesia: General

## 2016-01-11 MED ORDER — SODIUM CHLORIDE 0.9 % IV SOLN
INTRAVENOUS | Status: DC
  Administered 2016-01-11: 1000 mL via INTRAVENOUS

## 2016-01-11 MED ORDER — LIDOCAINE HCL (CARDIAC) 20 MG/ML IV SOLN
INTRAVENOUS | Status: DC | PRN
  Administered 2016-01-11: 30 mg via INTRAVENOUS

## 2016-01-11 MED ORDER — FENTANYL CITRATE (PF) 100 MCG/2ML IJ SOLN
INTRAMUSCULAR | Status: DC | PRN
  Administered 2016-01-11: 50 ug via INTRAVENOUS

## 2016-01-11 MED ORDER — MIDAZOLAM HCL 2 MG/2ML IJ SOLN
INTRAMUSCULAR | Status: DC | PRN
  Administered 2016-01-11: 1 mg via INTRAVENOUS

## 2016-01-11 MED ORDER — SODIUM CHLORIDE 0.9 % IV SOLN: INTRAVENOUS | Status: DC

## 2016-01-11 MED ORDER — PROPOFOL 500 MG/50ML IV EMUL
INTRAVENOUS | Status: DC | PRN
  Administered 2016-01-11: 170 ug/kg/min via INTRAVENOUS

## 2016-01-11 MED ORDER — GLYCOPYRROLATE 0.2 MG/ML IJ SOLN
INTRAMUSCULAR | Status: DC | PRN
  Administered 2016-01-11: 0.2 mg via INTRAVENOUS

## 2016-01-11 MED ORDER — PROPOFOL 10 MG/ML IV BOLUS
INTRAVENOUS | Status: DC | PRN
  Administered 2016-01-11: 50 mg via INTRAVENOUS

## 2016-01-11 NOTE — Anesthesia Procedure Notes (Signed)
Date/Time: 01/11/2016 8:11 AM Performed by: Doreen Salvage Pre-anesthesia Checklist: Patient identified, Emergency Drugs available, Suction available and Patient being monitored Patient Re-evaluated:Patient Re-evaluated prior to inductionOxygen Delivery Method: Nasal cannula Intubation Type: IV induction Dental Injury: Teeth and Oropharynx as per pre-operative assessment  Comments: Nasal cannula with etCO2 monitoring

## 2016-01-11 NOTE — Transfer of Care (Signed)
Immediate Anesthesia Transfer of Care Note  Patient: Carla Green  Procedure(s) Performed: Procedure(s): COLONOSCOPY WITH PROPOFOL (N/A) ESOPHAGOGASTRODUODENOSCOPY (EGD) WITH PROPOFOL (N/A)  Patient Location: PACU  Anesthesia Type:General  Level of Consciousness: sedated  Airway & Oxygen Therapy: Patient Spontanous Breathing and Patient connected to face mask oxygen  Post-op Assessment: Report given to RN and Post -op Vital signs reviewed and stable  Post vital signs: Reviewed and stable  Last Vitals:  Filed Vitals:   01/11/16 0720 01/11/16 0834  BP: 125/55 107/75  Pulse: 90 95  Temp: 36.4 C 35.9 C  Resp: 14 13    Complications: No apparent anesthesia complications

## 2016-01-11 NOTE — Anesthesia Postprocedure Evaluation (Signed)
Anesthesia Post Note  Patient: Carla Green  Procedure(s) Performed: Procedure(s) (LRB): COLONOSCOPY WITH PROPOFOL (N/A) ESOPHAGOGASTRODUODENOSCOPY (EGD) WITH PROPOFOL (N/A)  Patient location during evaluation: Endoscopy Anesthesia Type: General Level of consciousness: awake and alert Pain management: pain level controlled Vital Signs Assessment: post-procedure vital signs reviewed and stable Respiratory status: spontaneous breathing, nonlabored ventilation, respiratory function stable and patient connected to nasal cannula oxygen Cardiovascular status: blood pressure returned to baseline and stable Postop Assessment: no signs of nausea or vomiting Anesthetic complications: no    Last Vitals:  Filed Vitals:   01/11/16 0855 01/11/16 0905  BP: 130/82 114/88  Pulse: 102 94  Temp:    Resp: 14 11    Last Pain: There were no vitals filed for this visit.               Precious Haws Wyoma Genson

## 2016-01-11 NOTE — Anesthesia Preprocedure Evaluation (Signed)
Anesthesia Evaluation  Patient identified by MRN, date of birth, ID band Patient awake    Reviewed: Allergy & Precautions, H&P , NPO status , Patient's Chart, lab work & pertinent test results  History of Anesthesia Complications (+) PONV and history of anesthetic complications  Airway Mallampati: II  TM Distance: >3 FB Neck ROM: full    Dental  (+) Poor Dentition   Pulmonary neg pulmonary ROS, neg shortness of breath,    Pulmonary exam normal breath sounds clear to auscultation       Cardiovascular Exercise Tolerance: Good hypertension, (-) angina(-) Past MI and (-) DOE Normal cardiovascular exam+ dysrhythmias (PACs & PVCs)  Rhythm:regular Rate:Normal     Neuro/Psych PSYCHIATRIC DISORDERS Anxiety Depression  Neuromuscular disease    GI/Hepatic negative GI ROS, Neg liver ROS, neg GERD  ,  Endo/Other  negative endocrine ROS  Renal/GU negative Renal ROS  negative genitourinary   Musculoskeletal   Abdominal   Peds  Hematology negative hematology ROS (+)   Anesthesia Other Findings Past Medical History:   IBS (irritable bowel syndrome)                               Menopausal state                                             Neuropathy ([facility])                                             Mixed hyperlipidemia                                         History of kidney stones                                     Palpitations                                                 Complication of anesthesia                                   PONV (postoperative nausea and vomiting)                     Dysrhythmia                                                    Comment:palpitations   Uterine hyperplasia                                          Chickenpox  Insomnia                                                     PVC (premature ventricular contraction)                     Comment:and Colen Darling   Varicose veins                                               Depression                                                   Anxiety                                                     Past Surgical History:   TONSILLECTOMY                                                 HERNIA REPAIR                                                   Comment:femoral   leg vein                                                      HYSTEROSCOPY W/D&C                              N/A 05/07/2015      Comment:Procedure: DILATATION AND CURETTAGE               /HYSTEROSCOPY;  Surgeon: Benjaman Kindler, MD;                Location: [facility] ORS;  Service: Gynecology;                Laterality: N/A;   ESOPHAGOGASTRODUODENOSCOPY                       2013           Comment:normal   LAPAROSCOPIC TOTAL HYSTERECTOMY                               VARICOSE VEIN SURGERY  Comment:laser   COLONOSCOPY                                                   ABDOMINAL HYSTERECTOMY                                       BMI    Body Mass Index   20.84 kg/m 2      Reproductive/Obstetrics negative OB ROS                             Anesthesia Physical Anesthesia Plan  ASA: III  Anesthesia Plan: General   Post-op Pain Management:    Induction:   Airway Management Planned:   Additional Equipment:   Intra-op Plan:   Post-operative Plan:   Informed Consent: I have reviewed the patients History and Physical, chart, labs and discussed the procedure including the risks, benefits and alternatives for the proposed anesthesia with the patient or authorized representative who has indicated his/her understanding and acceptance.   Dental Advisory Given  Plan Discussed with: Anesthesiologist, CRNA and Surgeon  Anesthesia Plan Comments:         Anesthesia Quick Evaluation

## 2016-01-11 NOTE — H&P (Signed)
Primary Care Physician:  Halina Maidens, MD Primary Gastroenterologist:  Dr. Candace Cruise  Pre-Procedure History & Physical: HPI:  Carla Green is a 55 y.o. female is here for an EGD/colonoscopy   Past Medical History  Diagnosis Date  . IBS (irritable bowel syndrome)   . Menopausal state   . Neuropathy (Carla Green)   . Mixed hyperlipidemia   . History of kidney stones   . Palpitations   . Complication of anesthesia   . PONV (postoperative nausea and vomiting)   . Dysrhythmia     palpitations  . Uterine hyperplasia   . Chickenpox   . Insomnia   . PVC (premature ventricular contraction)     and Weinkebach  . Varicose veins   . Depression   . Anxiety     Past Surgical History  Procedure Laterality Date  . Tonsillectomy    . Hernia repair      femoral  . Leg vein    . Hysteroscopy w/d&c N/A 05/07/2015    Procedure: DILATATION AND CURETTAGE /HYSTEROSCOPY;  Surgeon: Benjaman Kindler, MD;  Location: [facility] ORS;  Service: Gynecology;  Laterality: N/A;  . Esophagogastroduodenoscopy  2013    normal  . Laparoscopic total hysterectomy    . Varicose vein surgery      laser  . Colonoscopy    . Abdominal hysterectomy      Prior to Admission medications   Medication Sig Start Date End Date Taking? Authorizing Provider  conjugated estrogens (PREMARIN) vaginal cream Place 1 Applicatorful vaginally daily.   Yes Historical Provider, MD  ergocalciferol (VITAMIN D2) 50000 units capsule Take 50,000 Units by mouth once a week.   Yes Historical Provider, MD  fluocinonide-emollient (LIDEX-E) 0.05 % cream Apply 1 application topically 2 (two) times daily.   Yes Historical Provider, MD  ALPRAZolam Duanne Moron) 0.5 MG tablet Take 1 tablet (0.5 mg total) by mouth at bedtime. 01/01/16   Glean Hess, MD  BLACK COHOSH PO Take by mouth daily.    Historical Provider, MD  Calcium-Magnesium-Zinc [phone] MG TABS Take by mouth daily.    Historical Provider, MD  estradiol (CLIMARA - DOSED IN MG/24 HR) 0.0375  mg/24hr patch Place 1 patch onto the skin once a week. 11/01/15   Historical Provider, MD  ibuprofen (ADVIL,MOTRIN) 800 MG tablet Take 1 tablet (800 mg total) by mouth every 8 (eight) hours as needed for moderate pain. Patient taking differently: Take 800 mg by mouth every 8 (eight) hours as needed for moderate pain. Takes 0.5 tabs as needed for pain 05/07/15   Benjaman Kindler, MD  Linaclotide [facility]) 145 MCG CAPS capsule Take 1 capsule by mouth daily. Reported on 11/23/2015 11/21/15 12/21/15  Historical Provider, MD  metoprolol succinate (TOPROL-XL) 50 MG 24 hr tablet Take 1 tablet (50 mg total) by mouth daily. 08/15/14   Minna Merritts, MD  polyethylene glycol powder (GLYCOLAX/MIRALAX) powder Take by mouth. 11/21/15   Historical Provider, MD  sertraline (ZOLOFT) 50 MG tablet Take 25 mg by mouth daily.  11/06/14   Historical Provider, MD  valACYclovir (VALTREX) 500 MG tablet TAKE 1 TABLET BY MOUTH EVERY DAY 08/13/15   Glean Hess, MD    Allergies as of 12/12/2015 - Review Complete 12/10/2015  Allergen Reaction Noted  . Codeine sulfate Itching 08/15/2014  . Duloxetine Nausea Only 08/13/2015    Family History  Problem Relation Age of Onset  . Heart attack Father     63  . Heart disease Father     CABG  .  Heart attack Paternal Uncle 83  . Heart disease Paternal Uncle     CABG x 4   . Heart attack Paternal Grandmother 54  . Colon cancer Father     Social History   Social History  . Marital Status: Legally Separated    Spouse Name: N/A  . Number of Children: N/A  . Years of Education: N/A   Occupational History  . Not on file.   Social History Main Topics  . Smoking status: Never Smoker   . Smokeless tobacco: Never Used  . Alcohol Use: No  . Drug Use: No  . Sexual Activity: Yes    Birth Control/ Protection: Post-menopausal   Other Topics Concern  . Not on file   Social History Narrative    Review of Systems: See HPI, otherwise negative ROS  Physical Exam: BP 125/55  mmHg  Pulse 90  Temp(Src) 97.6 F (36.4 C) (Tympanic)  Resp 14  Ht 5\' 2"  (1.575 m)  Wt 51.71 kg (114 lb)  BMI 20.85 kg/m2  SpO2 100%  LMP 04/01/2014 (Approximate) General:   Alert,  pleasant and cooperative in NAD Head:  Normocephalic and atraumatic. Neck:  Supple; no masses or thyromegaly. Lungs:  Clear throughout to auscultation.    Heart:  Regular rate and rhythm. Abdomen:  Soft, nontender and nondistended. Normal bowel sounds, without guarding, and without rebound.   Neurologic:  Alert and  oriented x4;  grossly normal neurologically.  Impression/Plan: Carla Green is here for an EGD/colonoscopy to be performed for dysphagia, constipation  Risks, benefits, limitations, and alternatives regarding EGD/colonoscopy have been reviewed with the patient.  Questions have been answered.  All parties agreeable.   Carla Green, Carla Dawn, MD  01/11/2016, 8:10 AM

## 2016-01-11 NOTE — Op Note (Signed)
[facility] Gastroenterology Patient Name: Carla Green Procedure Date: 01/11/2016 7:41 AM MRN: [id number] Account #: [id number] Date of Birth: 1961-08-03 Admit Type: Outpatient Age: 55 Room: [facility] ENDO ROOM 4 Gender: Female Note Status: Finalized Procedure:            Upper GI endoscopy Indications:          Dysphagia Providers:            Lupita Dawn. Candace Cruise, MD Referring MD:         Halina Maidens, MD (Referring MD) Medicines:            Monitored Anesthesia Care Complications:        No immediate complications. Procedure:            Pre-Anesthesia Assessment:                       - Prior to the procedure, a History and Physical was                        performed, and patient medications, allergies and                        sensitivities were reviewed. The patient's tolerance of                        previous anesthesia was reviewed.                       - The risks and benefits of the procedure and the                        sedation options and risks were discussed with the                        patient. All questions were answered and informed                        consent was obtained.                       - After reviewing the risks and benefits, the patient                        was deemed in satisfactory condition to undergo the                        procedure.                       After obtaining informed consent, the endoscope was                        passed under direct vision. Throughout the procedure,                        the patient's blood pressure, pulse, and oxygen                        saturations were monitored continuously. The Endoscope                        was  introduced through the mouth, and advanced to the                        second part of duodenum. The upper GI endoscopy was                        accomplished without difficulty. The patient tolerated                        the procedure well. Findings:      No  endoscopic abnormality was evident in the esophagus to explain the       patient's complaint of dysphagia. It was decided, however, to proceed       with dilation of the entire esophagus. The scope was withdrawn. Dilation       was performed with a Maloney dilator with mild resistance at 67 Fr.      Localized mildly erythematous mucosa was found in the prepyloric region       of the stomach. Biopsies were taken with a cold forceps for histology.      The exam was otherwise without abnormality.      The examined duodenum was normal.      A small hiatal hernia was present. Impression:           - No endoscopic esophageal abnormality to explain                        patient's dysphagia. Esophagus dilated. Dilated.                       - Erythematous mucosa in the prepyloric region of the                        stomach. Biopsied.                       - The examination was otherwise normal.                       - Normal examined duodenum.                       - Small hiatal hernia. Recommendation:       - Discharge patient to home.                       - Observe patient's clinical course.                       - Await pathology results.                       - The findings and recommendations were discussed with                        the patient. Procedure Code(s):    --- Professional ---                       [phone], Esophagogastroduodenoscopy, flexible, transoral;                        with biopsy, single or multiple  [id number], Dilation of esophagus, by unguided sound or                        bougie, single or multiple passes Diagnosis Code(s):    --- Professional ---                       R13.10, Dysphagia, unspecified                       K31.89, Other diseases of stomach and duodenum                       K44.9, Diaphragmatic hernia without obstruction or                        gangrene CPT copyright 2016 [employer]. All rights  reserved. The codes documented in this report are preliminary and upon coder review may  be revised to meet current compliance requirements. Hulen Luster, MD 01/11/2016 8:22:04 AM This report has been signed electronically. Number of Addenda: 0 Note Initiated On: 01/11/2016 7:41 AM      [facility]

## 2016-01-11 NOTE — Op Note (Signed)
[facility]ology Patient Name: Carla Green Procedure Date: 01/11/2016 7:37 AM MRN: [id number] Account #: [id number] Date of Birth: 1961-02-25 Admit Type: Outpatient Age: 55 Room: [facility] ENDO ROOM 4 Gender: Female Note Status: Finalized Procedure:            Colonoscopy Indications:          Constipation Providers:            Lupita Dawn. Candace Cruise, MD Referring MD:         Halina Maidens, MD (Referring MD) Medicines:            Monitored Anesthesia Care Complications:        No immediate complications. Procedure:            Pre-Anesthesia Assessment:                       - Prior to the procedure, a History and Physical was                        performed, and patient medications, allergies and                        sensitivities were reviewed. The patient's tolerance of                        previous anesthesia was reviewed.                       - The risks and benefits of the procedure and the                        sedation options and risks were discussed with the                        patient. All questions were answered and informed                        consent was obtained.                       - After reviewing the risks and benefits, the patient                        was deemed in satisfactory condition to undergo the                        procedure.                       After obtaining informed consent, the colonoscope was                        passed under direct vision. Throughout the procedure,                        the patient's blood pressure, pulse, and oxygen                        saturations were monitored continuously. The                        Colonoscope was introduced through  the anus and                        advanced to the the cecum, identified by appendiceal                        orifice and ileocecal valve. The colonoscopy was                        performed without difficulty. The patient tolerated the             procedure well. The quality of the bowel preparation                        was good. Findings:      The colon (entire examined portion) appeared normal. Impression:           - The entire examined colon is normal.                       - No specimens collected. Recommendation:       - Discharge patient to home.                       - Repeat colonoscopy in 10 years for surveillance.                       - The findings and recommendations were discussed with                        the patient.                       - High fiber diet daily.                       - Consider colonic transit study if constipation                        persists. Procedure Code(s):    --- Professional ---                       [phone], Colonoscopy, flexible; diagnostic, including                        collection of specimen(s) by brushing or washing, when                        performed (separate procedure) Diagnosis Code(s):    --- Professional ---                       K59.00, Constipation, unspecified CPT copyright 2016 American Medical Association. All rights reserved. The codes documented in this report are preliminary and upon coder review may  be revised to meet current compliance requirements. Hulen Luster, MD 01/11/2016 8:33:01 AM This report has been signed electronically. Number of Addenda: 0 Note Initiated On: 01/11/2016 7:37 AM Scope Withdrawal Time: 0 hours 5 minutes 6 seconds  Total Procedure Duration: 0 hours 7 minutes 37 seconds       [facility]

## 2016-01-14 LAB — SURGICAL PATHOLOGY

## 2016-01-16 ENCOUNTER — Encounter: Payer: Self-pay | Admitting: Gastroenterology

## 2016-02-04 ENCOUNTER — Ambulatory Visit (INDEPENDENT_AMBULATORY_CARE_PROVIDER_SITE_OTHER): Payer: 59 | Admitting: Internal Medicine

## 2016-02-04 ENCOUNTER — Encounter: Payer: Self-pay | Admitting: Internal Medicine

## 2016-02-04 VITALS — BP 122/76 | HR 75 | Temp 97.9°F | Ht 62.0 in | Wt 119.4 lb

## 2016-02-04 DIAGNOSIS — J40 Bronchitis, not specified as acute or chronic: Secondary | ICD-10-CM | POA: Diagnosis not present

## 2016-02-04 MED ORDER — LEVOFLOXACIN 500 MG PO TABS
500.0000 mg | ORAL_TABLET | Freq: Every day | ORAL | Status: DC
Start: 2016-02-04 — End: 2016-03-25

## 2016-02-04 NOTE — Patient Instructions (Addendum)
Resume Flonase nasal spray daily  Resume Zyrtec daily for 7 days  Only begin Levaquin if purulent sputum appearsAcute Bronchitis Bronchitis is inflammation of the airways that extend from the windpipe into the lungs (bronchi). The inflammation often causes mucus to develop. This leads to a cough, which is the most common symptom of bronchitis.  In acute bronchitis, the condition usually develops suddenly and goes away over time, usually in a couple weeks. Smoking, allergies, and asthma can make bronchitis worse. Repeated episodes of bronchitis may cause further lung problems.  CAUSES Acute bronchitis is most often caused by the same virus that causes a cold. The virus can spread from person to person (contagious) through coughing, sneezing, and touching contaminated objects. SIGNS AND SYMPTOMS   Cough.   Fever.   Coughing up mucus.   Body aches.   Chest congestion.   Chills.   Shortness of breath.   Sore throat.  DIAGNOSIS  Acute bronchitis is usually diagnosed through a physical exam. Your health care provider will also ask you questions about your medical history. Tests, such as chest X-rays, are sometimes done to rule out other conditions.  TREATMENT  Acute bronchitis usually goes away in a couple weeks. Oftentimes, no medical treatment is necessary. Medicines are sometimes given for relief of fever or cough. Antibiotic medicines are usually not needed but may be prescribed in certain situations. In some cases, an inhaler may be recommended to help reduce shortness of breath and control the cough. A cool mist vaporizer may also be used to help thin bronchial secretions and make it easier to clear the chest.  HOME CARE INSTRUCTIONS  Get plenty of rest.   Drink enough fluids to keep your urine clear or pale yellow (unless you have a medical condition that requires fluid restriction). Increasing fluids may help thin your respiratory secretions (sputum) and reduce chest  congestion, and it will prevent dehydration.   Take medicines only as directed by your health care provider.  If you were prescribed an antibiotic medicine, finish it all even if you start to feel better.  Avoid smoking and secondhand smoke. Exposure to cigarette smoke or irritating chemicals will make bronchitis worse. If you are a smoker, consider using nicotine gum or skin patches to help control withdrawal symptoms. Quitting smoking will help your lungs heal faster.   Reduce the chances of another bout of acute bronchitis by washing your hands frequently, avoiding people with cold symptoms, and trying not to touch your hands to your mouth, nose, or eyes.   Keep all follow-up visits as directed by your health care provider.  SEEK MEDICAL CARE IF: Your symptoms do not improve after 1 week of treatment.  SEEK IMMEDIATE MEDICAL CARE IF:  You develop an increased fever or chills.   You have chest pain.   You have severe shortness of breath.  You have bloody sputum.   You develop dehydration.  You faint or repeatedly feel like you are going to pass out.  You develop repeated vomiting.  You develop a severe headache. MAKE SURE YOU:   Understand these instructions.  Will watch your condition.  Will get help right away if you are not doing well or get worse.   This information is not intended to replace advice given to you by your health care provider. Make sure you discuss any questions you have with your health care provider.   Document Released: 12/11/2004 Document Revised: 11/24/2014 Document Reviewed: 04/26/2013 Elsevier Interactive Patient Education [employer].

## 2016-02-04 NOTE — Progress Notes (Signed)
Date:  02/04/2016   Name:  Carla Green   DOB:  Oct 11, 1961   MRN:  [id number]   Chief Complaint: Ear Pain and Cough Cough This is a new problem. The current episode started in the past 7 days. The problem has been waxing and waning. The problem occurs hourly. The cough is non-productive. Associated symptoms include ear congestion, ear pain and postnasal drip. Pertinent negatives include no chest pain, chills, fever, headaches, sore throat, shortness of breath or wheezing.  Otalgia  There is pain in the left ear. This is a new problem. The current episode started yesterday. The problem occurs every few hours. There has been no fever. The pain is mild. Associated symptoms include coughing. Pertinent negatives include no headaches, hearing loss or sore throat. She has tried nothing for the symptoms.      Review of Systems  Constitutional: Negative for fever, chills and fatigue.  HENT: Positive for ear pain and postnasal drip. Negative for hearing loss, sore throat and tinnitus.   Eyes: Negative for visual disturbance.  Respiratory: Positive for cough. Negative for choking, chest tightness, shortness of breath and wheezing.   Cardiovascular: Negative for chest pain and palpitations.  Neurological: Negative for dizziness and headaches.  Hematological: Negative for adenopathy.    Patient Active Problem List   Diagnosis Date Noted  . Heart palpitations 08/22/2015  . Anxiety 08/15/2014  . Essential hypertension 08/15/2014  . Irritable bowel syndrome with constipation 06/27/2014  . Cannot sleep 06/27/2014  . Neuritis or radiculitis due to rupture of lumbar intervertebral disc 06/27/2014  . Beat, premature ventricular 06/27/2014  . 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 Authorizing Provider  acetaminophen (TYLENOL) 650 MG CR tablet Take 650 mg by mouth every 8 (eight) hours as needed for pain.   Yes Historical  Provider, MD  ALPRAZolam Duanne Moron) 0.5 MG tablet Take 1 tablet (0.5 mg total) by mouth at bedtime. 01/01/16  Yes Glean Hess, MD  Calcium-Magnesium-Zinc [phone] MG TABS Take by mouth daily.   Yes Historical Provider, MD  ergocalciferol (VITAMIN D2) 50000 units capsule Take 50,000 Units by mouth once a week.   Yes Historical Provider, MD  estradiol (CLIMARA - DOSED IN MG/24 HR) 0.0375 mg/24hr patch Place 1 patch onto the skin once a week. 11/01/15  Yes Historical Provider, MD  ibuprofen (ADVIL,MOTRIN) 800 MG tablet Take 1 tablet (800 mg total) by mouth every 8 (eight) hours as needed for moderate pain. Patient taking differently: Take 800 mg by mouth every 8 (eight) hours as needed for moderate pain. Takes 0.5 tabs as needed for pain 05/07/15  Yes Benjaman Kindler, MD  metoprolol succinate (TOPROL-XL) 50 MG 24 hr tablet Take 1 tablet (50 mg total) by mouth daily. 08/15/14  Yes Minna Merritts, MD  polyethylene glycol powder (GLYCOLAX/MIRALAX) powder Take by mouth. 11/21/15  Yes Historical Provider, MD  sertraline (ZOLOFT) 50 MG tablet Take 25 mg by mouth daily.  11/06/14  Yes Historical Provider, MD  valACYclovir (VALTREX) 500 MG tablet TAKE 1 TABLET BY MOUTH EVERY DAY 08/13/15  Yes Glean Hess, MD    Allergies  Allergen Reactions  . Codeine Sulfate Itching  . Duloxetine Nausea Only    Past Surgical History  Procedure Laterality Date  . Tonsillectomy    . Hernia repair      femoral  . Leg vein    . Hysteroscopy w/d&c N/A 05/07/2015    Procedure:  DILATATION AND CURETTAGE /HYSTEROSCOPY;  Surgeon: Benjaman Kindler, MD;  Location: [facility] ORS;  Service: Gynecology;  Laterality: N/A;  . Esophagogastroduodenoscopy  2013    normal  . Laparoscopic total hysterectomy    . Varicose vein surgery      laser  . Colonoscopy    . Abdominal hysterectomy    . Colonoscopy with propofol N/A 01/11/2016    Procedure: COLONOSCOPY WITH PROPOFOL;  Surgeon: Hulen Luster, MD;  Location: [facility] ENDOSCOPY;  Service:  Gastroenterology;  Laterality: N/A;  . Esophagogastroduodenoscopy (egd) with propofol N/A 01/11/2016    Procedure: ESOPHAGOGASTRODUODENOSCOPY (EGD) WITH PROPOFOL;  Surgeon: Hulen Luster, MD;  Location: [facility] ENDOSCOPY;  Service: Gastroenterology;  Laterality: N/A;    Social History  Substance Use Topics  . Smoking status: Never Smoker   . Smokeless tobacco: Never Used  . Alcohol Use: No     Medication list has been reviewed and updated.   Physical Exam  Constitutional: She is oriented to person, place, and time. She appears well-developed. No distress.  HENT:  Head: Normocephalic and atraumatic.  Right Ear: Tympanic membrane is not erythematous and not retracted.  Left Ear: Tympanic membrane is retracted. Tympanic membrane is not erythematous.  Mouth/Throat: Uvula is midline and oropharynx is clear and moist. No posterior oropharyngeal edema or posterior oropharyngeal erythema.  Cardiovascular: Normal rate, regular rhythm and normal heart sounds.   Pulmonary/Chest: Effort normal and breath sounds normal. No respiratory distress. She has no wheezes. She has no rhonchi.  Musculoskeletal: Normal range of motion.  Neurological: She is alert and oriented to person, place, and time.  Skin: Skin is warm and dry. No rash noted.  Psychiatric: She has a normal mood and affect. Her behavior is normal. Thought content normal.  Nursing note and vitals reviewed.   BP 122/76 mmHg  Pulse 75  Temp(Src) 97.9 F (36.6 C) (Oral)  Ht 5\' 2"  (1.575 m)  Wt 119 lb 6.4 oz (54.159 kg)  BMI 21.83 kg/m2  SpO2 98%  LMP 04/01/2014 (Approximate)  Assessment and Plan: 1. Bronchitis Early viral syndrome but patient concerned that URIs always become bronchitis  Resume Flonase and Zyrtec Rx for levofloxacin given and instructions to take only if fever or purulent sputum develops - levofloxacin (LEVAQUIN) 500 MG tablet; Take 1 tablet (500 mg total) by mouth daily.  Dispense: 7 tablet; Refill: 0   Halina Maidens, MD [facility]  02/04/2016

## 2016-02-07 DIAGNOSIS — N760 Acute vaginitis: Secondary | ICD-10-CM | POA: Diagnosis not present

## 2016-02-07 DIAGNOSIS — R300 Dysuria: Secondary | ICD-10-CM | POA: Diagnosis not present

## 2016-02-07 DIAGNOSIS — N941 Unspecified dyspareunia: Secondary | ICD-10-CM | POA: Diagnosis not present

## 2016-02-08 DIAGNOSIS — H02403 Unspecified ptosis of bilateral eyelids: Secondary | ICD-10-CM | POA: Diagnosis not present

## 2016-02-08 DIAGNOSIS — L908 Other atrophic disorders of skin: Secondary | ICD-10-CM | POA: Diagnosis not present

## 2016-03-25 ENCOUNTER — Encounter: Payer: Self-pay | Admitting: Internal Medicine

## 2016-03-25 ENCOUNTER — Ambulatory Visit (INDEPENDENT_AMBULATORY_CARE_PROVIDER_SITE_OTHER): Payer: 59 | Admitting: Internal Medicine

## 2016-03-25 VITALS — BP 108/65 | HR 88 | Temp 98.1°F | Resp 16 | Ht 62.0 in | Wt 120.0 lb

## 2016-03-25 DIAGNOSIS — R195 Other fecal abnormalities: Secondary | ICD-10-CM

## 2016-03-25 DIAGNOSIS — Z01818 Encounter for other preprocedural examination: Secondary | ICD-10-CM

## 2016-03-25 NOTE — Progress Notes (Signed)
Date:  03/25/2016   Name:  Carla Green   DOB:  Apr 27, 1961   MRN:  [id number]   Chief Complaint: Medical Clearance HPI Patient is scheduled for a Blepharoplasty and needs medical clearance.  The procedure will be done under local anesthesia as an outpatient. She feels well.  Her palpitations are much less since cutting back on caffeine and taking metoprolol. She has noted pale yellow/tan stools consistently over the past month.  No bleeding or abdominal pain.  No abdominal distention or vomiting.  No jaundice.  Her stools are chronically loose and this had not changed.  Review of Systems  Constitutional: Negative for fever and chills.  HENT: Negative for facial swelling, trouble swallowing and voice change.   Eyes: Positive for visual disturbance (due to ptosis).  Respiratory: Negative for chest tightness, shortness of breath and wheezing.   Cardiovascular: Negative for chest pain, palpitations and leg swelling.  Gastrointestinal: Negative for nausea, vomiting, abdominal pain and diarrhea.       Pale stools  Genitourinary: Negative for dysuria and hematuria.  Neurological: Negative for dizziness and syncope.    Patient Active Problem List   Diagnosis Date Noted  . Heart palpitations 08/22/2015  . Anxiety 08/15/2014  . Essential hypertension 08/15/2014  . Irritable bowel syndrome with constipation 06/27/2014  . Cannot sleep 06/27/2014  . Neuritis or radiculitis due to rupture of lumbar intervertebral disc 06/27/2014  . Beat, premature ventricular 06/27/2014  . 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 Authorizing Provider  acetaminophen (TYLENOL) 650 MG CR tablet Take 650 mg by mouth every 8 (eight) hours as needed for pain.   Yes Historical Provider, MD  ALPRAZolam Duanne Moron) 0.5 MG tablet Take 1 tablet (0.5 mg total) by mouth at bedtime. 01/01/16  Yes Glean Hess, MD  Calcium-Magnesium-Zinc [phone] MG TABS  Take by mouth daily.   Yes Historical Provider, MD  ergocalciferol (VITAMIN D2) 50000 units capsule Take 50,000 Units by mouth once a week.   Yes Historical Provider, MD  estradiol (CLIMARA - DOSED IN MG/24 HR) 0.0375 mg/24hr patch Place 1 patch onto the skin once a week. 11/01/15  Yes Historical Provider, MD  ibuprofen (ADVIL,MOTRIN) 800 MG tablet Take 1 tablet (800 mg total) by mouth every 8 (eight) hours as needed for moderate pain. Patient taking differently: Take 800 mg by mouth every 8 (eight) hours as needed for moderate pain. Takes 0.5 tabs as needed for pain 05/07/15  Yes Benjaman Kindler, MD  metoprolol succinate (TOPROL-XL) 50 MG 24 hr tablet Take 1 tablet (50 mg total) by mouth daily. 08/15/14  Yes Minna Merritts, MD  polyethylene glycol powder (GLYCOLAX/MIRALAX) powder Take by mouth. 11/21/15  Yes Historical Provider, MD  sertraline (ZOLOFT) 50 MG tablet Take 25 mg by mouth daily.  11/06/14  Yes Historical Provider, MD  valACYclovir (VALTREX) 500 MG tablet TAKE 1 TABLET BY MOUTH EVERY DAY 08/13/15  Yes Glean Hess, MD    Allergies  Allergen Reactions  . Codeine Sulfate Itching  . Duloxetine Nausea Only    Past Surgical History  Procedure Laterality Date  . Tonsillectomy    . Hernia repair      femoral  . Leg vein    . Hysteroscopy w/d&c N/A 05/07/2015    Procedure: DILATATION AND CURETTAGE /HYSTEROSCOPY;  Surgeon: Benjaman Kindler, MD;  Location: [facility] ORS;  Service: Gynecology;  Laterality: N/A;  . Esophagogastroduodenoscopy  2013  normal  . Laparoscopic total hysterectomy    . Varicose vein surgery      laser  . Colonoscopy    . Abdominal hysterectomy    . Colonoscopy with propofol N/A 01/11/2016    Procedure: COLONOSCOPY WITH PROPOFOL;  Surgeon: Hulen Luster, MD;  Location: [facility] ENDOSCOPY;  Service: Gastroenterology;  Laterality: N/A;  . Esophagogastroduodenoscopy (egd) with propofol N/A 01/11/2016    Procedure: ESOPHAGOGASTRODUODENOSCOPY (EGD) WITH PROPOFOL;  Surgeon:  Hulen Luster, MD;  Location: [facility] ENDOSCOPY;  Service: Gastroenterology;  Laterality: N/A;    Social History  Substance Use Topics  . Smoking status: Never Smoker   . Smokeless tobacco: Never Used  . Alcohol Use: No     Medication list has been reviewed and updated.   Physical Exam  Constitutional: She is oriented to person, place, and time. She appears well-developed. No distress.  HENT:  Head: Normocephalic and atraumatic.  Neck: Normal range of motion. Neck supple. Carotid bruit is not present. No thyromegaly present.  Cardiovascular: Normal rate, regular rhythm and normal heart sounds.   Pulmonary/Chest: Effort normal and breath sounds normal. No respiratory distress. She has no wheezes. She has no rales.  Musculoskeletal: Normal range of motion. She exhibits no edema or tenderness.  Lymphadenopathy:    She has no cervical adenopathy.  Neurological: She is alert and oriented to person, place, and time.  Skin: Skin is warm and dry. No rash noted.  Psychiatric: She has a normal mood and affect. Her behavior is normal. Thought content normal.  Nursing note and vitals reviewed.   BP 108/65 mmHg  Pulse 88  Temp(Src) 98.1 F (36.7 C) (Oral)  Resp 16  Ht 5\' 2"  (1.575 m)  Wt 120 lb (54.432 kg)  BMI 21.94 kg/m2  SpO2 98%  LMP 04/01/2014 (Approximate)  Assessment and Plan: 1. Pre-op evaluation Cleared for surgery with no contra-indications  2. Pale stool Patient concerned about pancreatic or gall bladder/liver disease - US Abdomen Complete; Future   Halina Maidens, MD [facility]  03/25/2016

## 2016-04-02 ENCOUNTER — Ambulatory Visit
Admission: RE | Admit: 2016-04-02 | Discharge: 2016-04-02 | Disposition: A | Discharge status: Home / Self Care | Payer: 59 | Source: Ambulatory Visit | Attending: Internal Medicine | Admitting: Internal Medicine

## 2016-04-02 DIAGNOSIS — R195 Other fecal abnormalities: Secondary | ICD-10-CM | POA: Insufficient documentation

## 2016-04-02 DIAGNOSIS — R194 Change in bowel habit: Secondary | ICD-10-CM | POA: Diagnosis not present

## 2016-04-21 ENCOUNTER — Other Ambulatory Visit: Payer: Self-pay | Admitting: Internal Medicine

## 2016-06-17 DIAGNOSIS — D2239 Melanocytic nevi of other parts of face: Secondary | ICD-10-CM | POA: Diagnosis not present

## 2016-06-17 DIAGNOSIS — L820 Inflamed seborrheic keratosis: Secondary | ICD-10-CM | POA: Diagnosis not present

## 2016-06-17 DIAGNOSIS — D485 Neoplasm of uncertain behavior of skin: Secondary | ICD-10-CM | POA: Diagnosis not present

## 2016-06-17 DIAGNOSIS — L858 Other specified epidermal thickening: Secondary | ICD-10-CM | POA: Diagnosis not present

## 2016-06-23 DIAGNOSIS — H524 Presbyopia: Secondary | ICD-10-CM | POA: Diagnosis not present

## 2016-06-26 DIAGNOSIS — L574 Cutis laxa senilis: Secondary | ICD-10-CM | POA: Diagnosis not present

## 2016-07-14 ENCOUNTER — Ambulatory Visit (INDEPENDENT_AMBULATORY_CARE_PROVIDER_SITE_OTHER): Payer: 59 | Admitting: Internal Medicine

## 2016-07-14 ENCOUNTER — Encounter: Payer: Self-pay | Admitting: Internal Medicine

## 2016-07-14 ENCOUNTER — Other Ambulatory Visit: Payer: Self-pay

## 2016-07-14 DIAGNOSIS — J3089 Other allergic rhinitis: Secondary | ICD-10-CM | POA: Diagnosis not present

## 2016-07-14 DIAGNOSIS — G5792 Unspecified mononeuropathy of left lower limb: Secondary | ICD-10-CM

## 2016-07-14 DIAGNOSIS — G5793 Unspecified mononeuropathy of bilateral lower limbs: Secondary | ICD-10-CM

## 2016-07-14 DIAGNOSIS — G5791 Unspecified mononeuropathy of right lower limb: Secondary | ICD-10-CM | POA: Diagnosis not present

## 2016-07-14 IMAGING — CT CT HEART SCORING
2 series · 16 of 20 positions shown, 18 images · non-contrast
Comparison: No priors.

CLINICAL DATA: Risk stratification

EXAM:
Coronary Calcium Score
TECHNIQUE: The patient was scanned on a Siemens Sensation 16 slice scanner.
Axial non-contrast 3mm slices were carried out through the heart.
The data set was analyzed on a dedicated work station and scored
using the Agatson method.

[Series 2: casc 3.0 i36f 2 bestdiast 69 % · axial · 0.28mm/px · z∈[-201,-105]mm · 8 of 42 slices shown, 10 images]
[im 5/42  vessel]
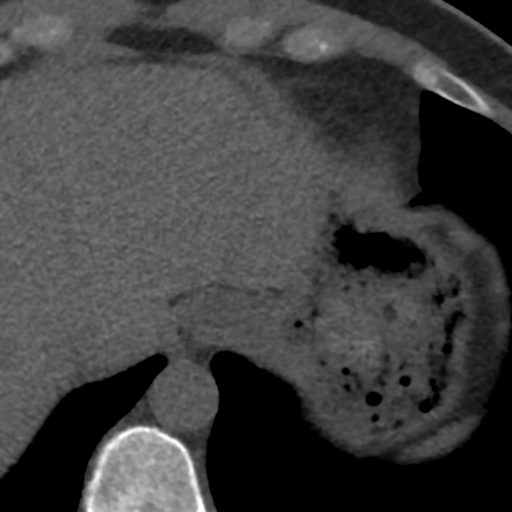
[im 5/42  lung]
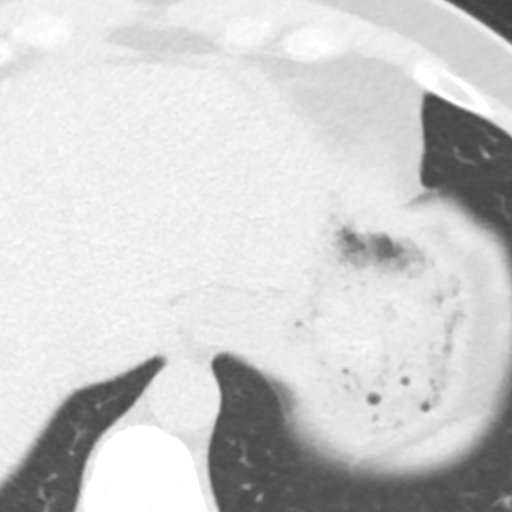
[im 10/42  vessel]
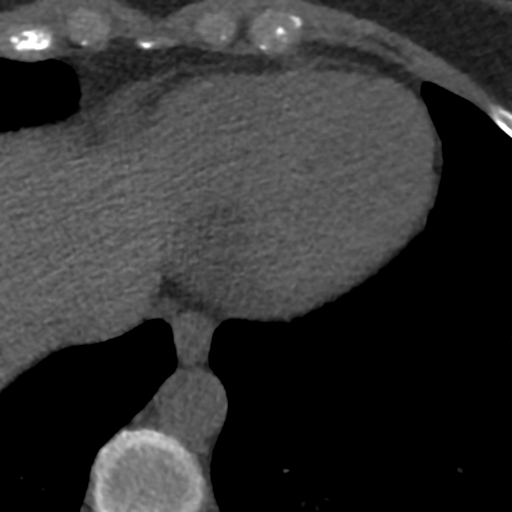
[im 14/42  vessel]
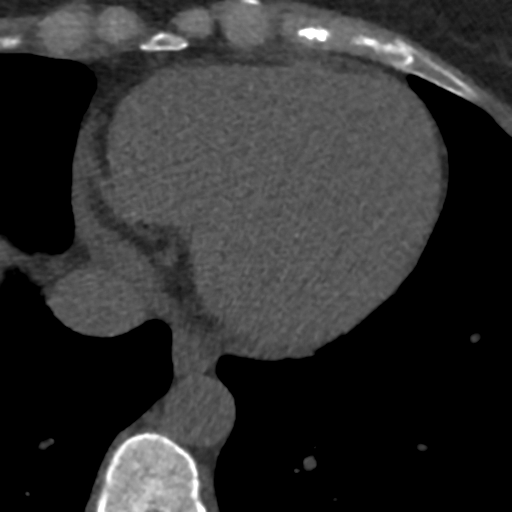
[im 19/42  vessel]
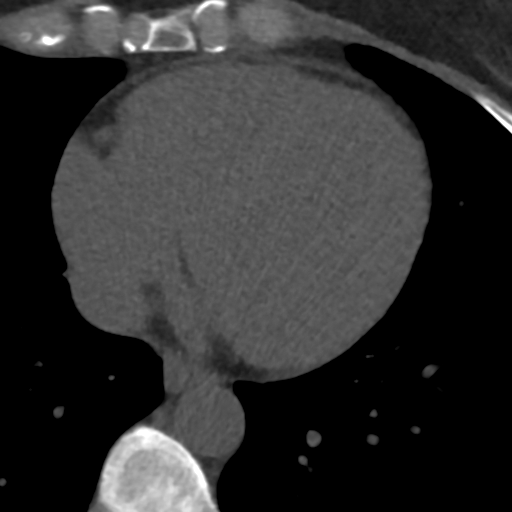
[im 23/42  vessel]
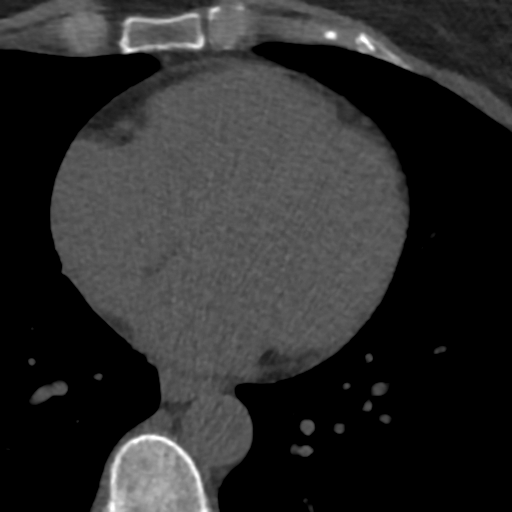
[im 23/42  lung]
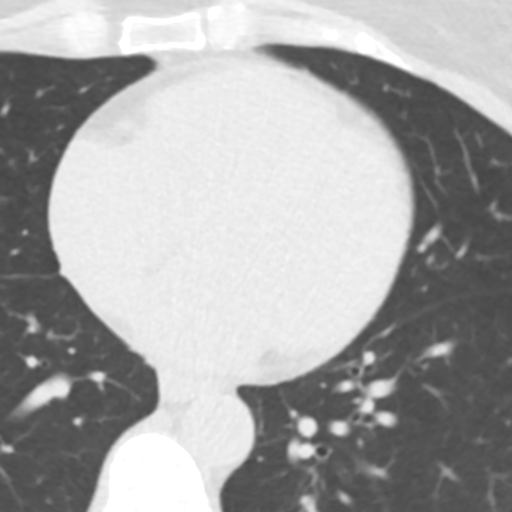
[im 28/42  vessel]
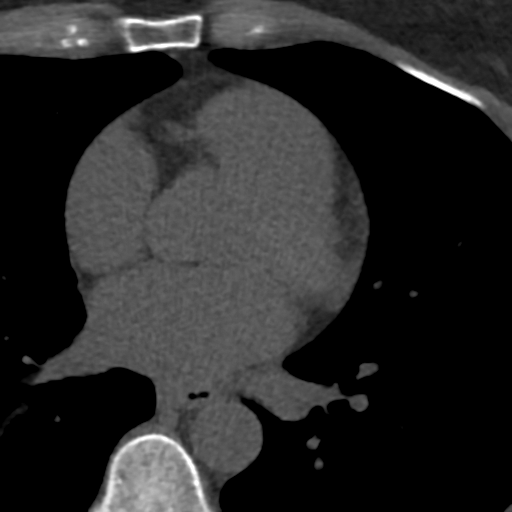
[im 32/42  vessel]
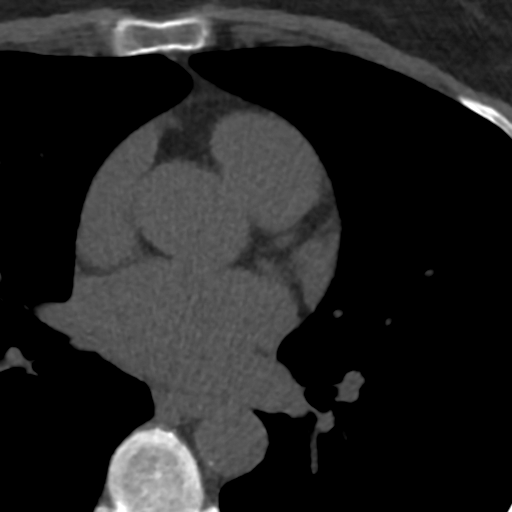
[im 37/42  vessel]
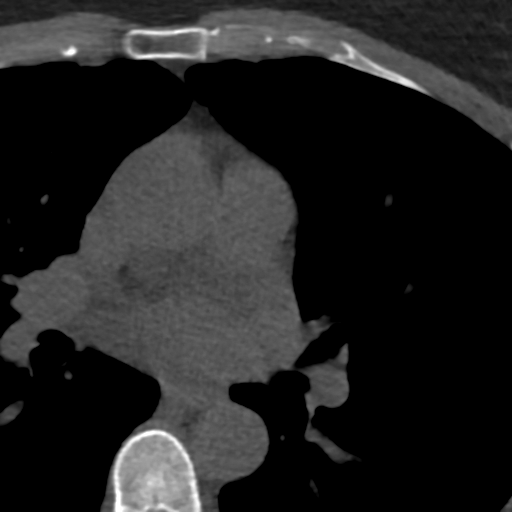

[Series 4: lung st 71 % · axial · 0.60mm/px · z∈[-201,-105]mm · 8 of 42 slices shown]
[im 5/42  lung]
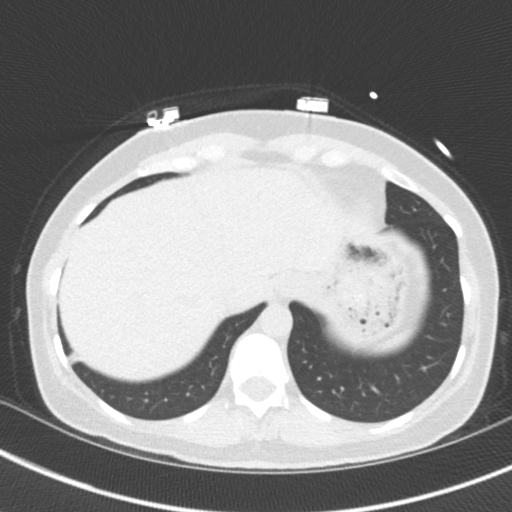
[im 10/42  lung]
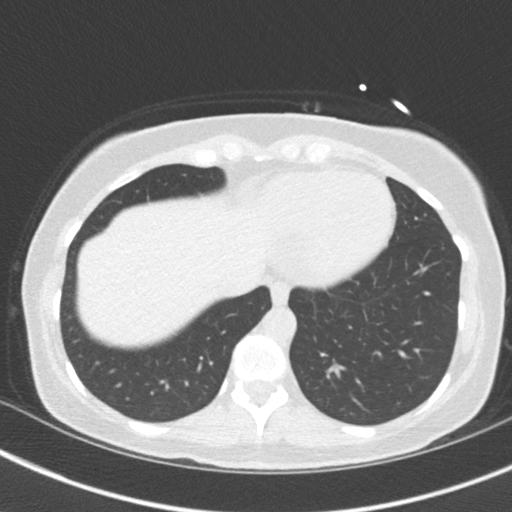
[im 14/42  lung]
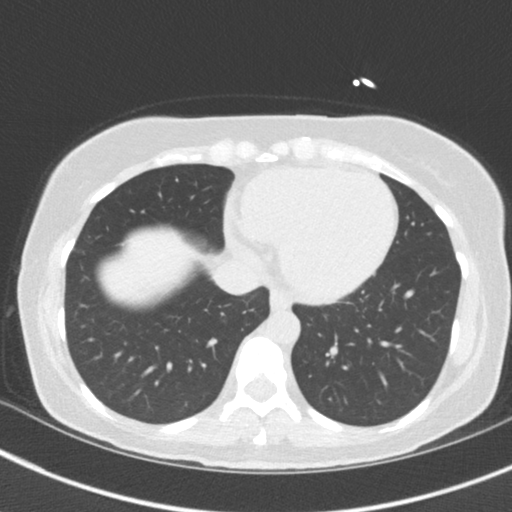
[im 19/42  lung]
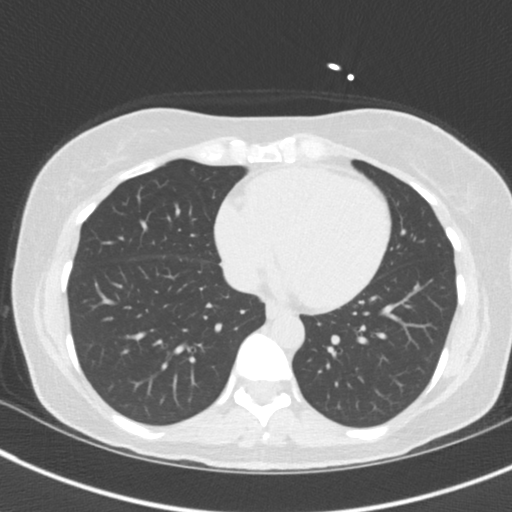
[im 23/42  lung]
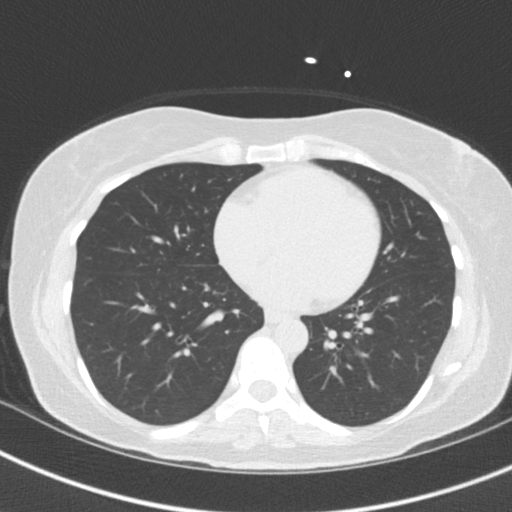
[im 28/42  lung]
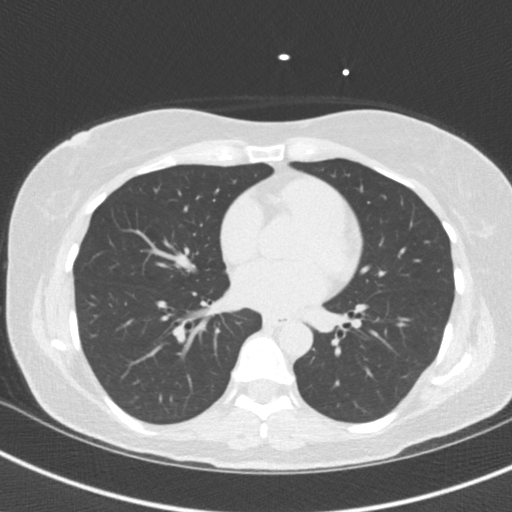
[im 32/42  lung]
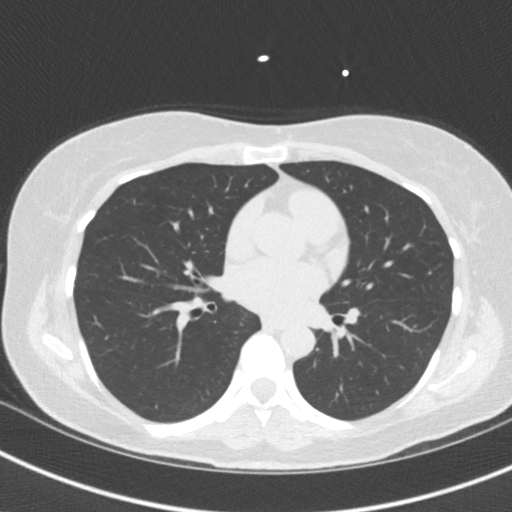
[im 37/42  lung]
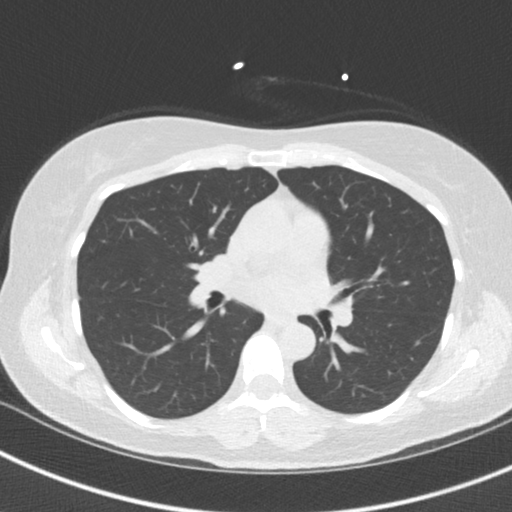

[16 of 20 positions shown; findings below may reference images not displayed]

FINDINGS: Non-cardiac: No significant non cardiac findings on limited lung and
soft tissue windows. See separate report from [REDACTED].

Ascending Aorta:  3.2 cm

Pericardium: Normal

Coronary arteries:  No calcium detected
IMPRESSION: Coronary calcium score of 0.

Kleeven Lk

EXAM:
OVER-READ INTERPRETATION  CT CHEST

The following report is an over-read performed by radiologist Dr.
over-read does not include interpretation of cardiac or coronary
anatomy or pathology. The coronary calcium score interpretation by
the cardiologist is attached.
FINDINGS: Mild scarring in the periphery of the right lung base. Within the
visualized portions of the thorax there are no suspicious appearing
pulmonary nodules or masses, there is no acute consolidative
airspace disease, no pleural effusion, no pneumothorax and no
lymphadenopathy. Visualized portions of the upper abdomen are
unremarkable. There are no aggressive appearing lytic or blastic
lesions noted in the visualized portions of the skeleton.
IMPRESSION: 1. No significant incidental noncardiac findings noted.

## 2016-07-14 MED ORDER — GABAPENTIN 300 MG PO CAPS: 600.0000 mg | ORAL_CAPSULE | Freq: Every day | ORAL | 0 refills | Status: DC

## 2016-07-14 MED ORDER — FLUTICASONE PROPIONATE 50 MCG/ACT NA SUSP: 2.0000 | Freq: Every day | NASAL | 1 refills | Status: DC

## 2016-07-14 NOTE — Progress Notes (Signed)
Date:  07/14/2016   Name:  Carla Green   DOB:  12/22/1960   MRN:  [id number]   Chief Complaint: Leg Pain (Seen by Nile Dear 16 mo ago and dx with a form of Paraformus that was told to be muscle pressing on nerves in buttock. Cold feet and feel like cramping up and like they are frozen at times. Painful like nerve pain. Hx of hip pain. ) main complaint is very cold feet to point of pain.  Wearing extra socks with no benefit. Has history of piriformis muscle syndrome but not much back pain at present. Tried neurontin but at low dose for 2 weeks then stopped due to no apparent benefit.   Review of Systems  Constitutional: Negative for chills, fatigue and fever.  Respiratory: Negative for cough, chest tightness and shortness of breath.   Cardiovascular: Negative for chest pain, palpitations and leg swelling.  Gastrointestinal: Negative for abdominal pain.  Musculoskeletal: Negative for arthralgias, gait problem, joint swelling and myalgias.  Skin: Negative for rash.  Neurological: Negative for syncope and numbness.    Patient Active Problem List   Diagnosis Date Noted  . Heart palpitations 08/22/2015  . Anxiety 08/15/2014  . Essential hypertension 08/15/2014  . Irritable bowel syndrome with constipation 06/27/2014  . Cannot sleep 06/27/2014  . Neuritis or radiculitis due to rupture of lumbar intervertebral disc 06/27/2014  . Beat, premature ventricular 06/27/2014  . 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 Authorizing Provider  ALPRAZolam Duanne Moron) 0.5 MG tablet nightly. 11/19/09  Yes Historical Provider, MD  ergocalciferol (VITAMIN D2) 50000 units capsule Take 50,000 Units by mouth once a week.   Yes Historical Provider, MD  ibuprofen (ADVIL,MOTRIN) 800 MG tablet Take 1 tablet (800 mg total) by mouth every 8 (eight) hours as needed for moderate pain. Patient taking differently: Take 800 mg by mouth every 8 (eight) hours  as needed for moderate pain. Takes 0.5 tabs as needed for pain 05/07/15  Yes Benjaman Kindler, MD  metoprolol (LOPRESSOR) 50 MG tablet TAKE 1 TABLET BY MOUTH DAILY Patient taking differently: 1 tab prn 04/21/16  Yes Glean Hess, MD  metoprolol succinate (TOPROL-XL) 50 MG 24 hr tablet Take 1 tablet (50 mg total) by mouth daily. 08/15/14  Yes Minna Merritts, MD  valACYclovir (VALTREX) 500 MG tablet once as needed. 11/16/09  Yes Historical Provider, MD    Allergies  Allergen Reactions  . Codeine Sulfate Itching  . Duloxetine Nausea Only    Past Surgical History:  Procedure Laterality Date  . ABDOMINAL HYSTERECTOMY    . COLONOSCOPY    . COLONOSCOPY WITH PROPOFOL N/A 01/11/2016   Procedure: COLONOSCOPY WITH PROPOFOL;  Surgeon: Hulen Luster, MD;  Location: [facility] ENDOSCOPY;  Service: Gastroenterology;  Laterality: N/A;  . ESOPHAGOGASTRODUODENOSCOPY  2013   normal  . ESOPHAGOGASTRODUODENOSCOPY (EGD) WITH PROPOFOL N/A 01/11/2016   Procedure: ESOPHAGOGASTRODUODENOSCOPY (EGD) WITH PROPOFOL;  Surgeon: Hulen Luster, MD;  Location: [facility] ENDOSCOPY;  Service: Gastroenterology;  Laterality: N/A;  . HERNIA REPAIR     femoral  . HYSTEROSCOPY W/D&C N/A 05/07/2015   Procedure: DILATATION AND CURETTAGE /HYSTEROSCOPY;  Surgeon: Benjaman Kindler, MD;  Location: [facility] ORS;  Service: Gynecology;  Laterality: N/A;  . LAPAROSCOPIC TOTAL HYSTERECTOMY    . leg vein    . TONSILLECTOMY    . VARICOSE VEIN SURGERY     laser    Social History  Substance Use Topics  .  Smoking status: Never Smoker  . Smokeless tobacco: Never Used  . Alcohol use No     Medication list has been reviewed and updated.   Physical Exam  Constitutional: She is oriented to person, place, and time. She appears well-developed. No distress.  HENT:  Head: Normocephalic and atraumatic.  Cardiovascular: Normal rate, regular rhythm and normal heart sounds.   Pulses:      Dorsalis pedis pulses are 2+ on the right side, and 2+ on the left side.         Posterior tibial pulses are 2+ on the right side, and 2+ on the left side.  Pulmonary/Chest: Effort normal. No respiratory distress.  Abdominal: Soft. Normal appearance and bowel sounds are normal.  Musculoskeletal: Normal range of motion.       Lumbar back: Normal.  Neurological: She is alert and oriented to person, place, and time.  Feet and toes warm and well perfused bilaterally Pulses 2+.  No edema.  No apparent hyperesthesias.   Skin: Skin is warm, dry and intact. No rash noted.  Psychiatric: She has a normal mood and affect. Her speech is normal and behavior is normal. Thought content normal.  Nursing note and vitals reviewed.   BP 122/80 (BP Location: Right Arm, Patient Position: Sitting, Cuff Size: Normal)   Pulse 77   Temp 98.4 F (36.9 C) (Oral)   Resp 16   Ht 5\' 2"  (1.575 m)   Wt 119 lb (54 kg)   LMP 04/05/2014   SpO2 97%   BMI 21.77 kg/m   Assessment and Plan: 1. Neuropathic pain, leg, bilateral Check B12 level Resume Neurontin Consider Neurology referral - B12 - gabapentin (NEURONTIN) 300 MG capsule; Take 2 capsules (600 mg total) by mouth at bedtime.  Dispense: 60 capsule; Refill: 0  2. Environmental and seasonal allergies - fluticasone (FLONASE) 50 MCG/ACT nasal spray; Place 2 sprays into both nostrils daily.  Dispense: 48 g; Refill: [place], MD [facility]  07/14/2016

## 2016-07-15 ENCOUNTER — Other Ambulatory Visit: Payer: Self-pay | Admitting: Internal Medicine

## 2016-07-16 DIAGNOSIS — G5792 Unspecified mononeuropathy of left lower limb: Secondary | ICD-10-CM | POA: Diagnosis not present

## 2016-07-16 DIAGNOSIS — G5791 Unspecified mononeuropathy of right lower limb: Secondary | ICD-10-CM | POA: Diagnosis not present

## 2016-07-17 LAB — VITAMIN B12: VITAMIN B 12: 776 pg/mL (ref 211–946)

## 2016-07-25 DIAGNOSIS — R102 Pelvic and perineal pain: Secondary | ICD-10-CM | POA: Diagnosis not present

## 2016-07-25 DIAGNOSIS — N951 Menopausal and female climacteric states: Secondary | ICD-10-CM | POA: Diagnosis not present

## 2016-09-17 DIAGNOSIS — M955 Acquired deformity of pelvis: Secondary | ICD-10-CM | POA: Diagnosis not present

## 2016-09-17 DIAGNOSIS — M9903 Segmental and somatic dysfunction of lumbar region: Secondary | ICD-10-CM | POA: Diagnosis not present

## 2016-09-17 DIAGNOSIS — M9905 Segmental and somatic dysfunction of pelvic region: Secondary | ICD-10-CM | POA: Diagnosis not present

## 2016-09-17 DIAGNOSIS — M5431 Sciatica, right side: Secondary | ICD-10-CM | POA: Diagnosis not present

## 2016-09-22 ENCOUNTER — Telehealth: Payer: Self-pay | Admitting: Cardiovascular Disease

## 2016-09-22 NOTE — Telephone Encounter (Signed)
Pt states for the last 2 weeks she has had a "burning around my heart". States she has had SOB with this burning. Pt is coming tomorrow to see dr Rockey Situ at 11:40

## 2016-09-23 ENCOUNTER — Ambulatory Visit: Payer: 59 | Admitting: Cardiovascular Disease

## 2016-09-23 NOTE — Progress Notes (Deleted)
Cardiology Office Note  Date:  09/23/2016   ID:  Carla Green, DOB [birth year], MRN [id number]  PCP:  Halina Maidens, MD   No chief complaint on file.   HPI:  [name] is a very pleasant 55 year old woman, patient of Dr. Army Melia, who presents for follow-up of her palpitations   In follow up today, she has had recent hysterectomy. Continues to have problems with constipation Currently on hormone therapy, trying to wean off and take black Cohosh. Denies having significant palpitations. Improvement of her anxiety Is not taking metoprolol on a regular basis, only as needed Continues on Zoloft  EKG on today's visit shows normal sinus rhythm with rate 71 bpm, no significant ST or T-wave changes  Other past medical history Previous problems with Insomnia. Palpitations more at nighttime. It was listening with a stethoscope Previous Lab work from primary care reviewed hemoglobin A1c 5.2, creatinine 0.73, hematocrit 39, TSH 1.0, total cholesterol 245, LDL 142 EKG from primary care August 2015 showing no ectopy otherwise normal EKG   long history of burning in her legs. Her symptoms seem to flareup more when she walks. She reports after a busy day at work, she is more symptomatic. She has tried Neurontin with no benefit.  She does have significant stressors. Mother passed away in 01/15/2014. She helps to take care of her elderly father. Also has in-laws that she helps to take care of. She has some marital issues, also other stress at home, possibly children. This has caused significant anxiety for her She has started working night shifts   Previous stress echo at [facility] March 2011 which was normal. She exercised for 9 minutes, peak heart rate 169 beats per minute, normal echo at rest and stress   PMH:   has a past medical history of Anxiety; Chickenpox; Complication of anesthesia; Depression; Dysrhythmia; History of kidney stones; IBS (irritable bowel syndrome);  Insomnia; Menopausal state; Mixed hyperlipidemia; Neuropathy ([place]); Palpitations; PONV (postoperative nausea and vomiting); PVC (premature ventricular contraction); Uterine hyperplasia; and Varicose veins.  PSH:    Past Surgical History:  Procedure Laterality Date  . ABDOMINAL HYSTERECTOMY    . COLONOSCOPY    . COLONOSCOPY WITH PROPOFOL N/A 01/11/2016   Procedure: COLONOSCOPY WITH PROPOFOL;  Surgeon: Hulen Luster, MD;  Location: [facility] ENDOSCOPY;  Service: Gastroenterology;  Laterality: N/A;  . ESOPHAGOGASTRODUODENOSCOPY  2013   normal  . ESOPHAGOGASTRODUODENOSCOPY (EGD) WITH PROPOFOL N/A 01/11/2016   Procedure: ESOPHAGOGASTRODUODENOSCOPY (EGD) WITH PROPOFOL;  Surgeon: Hulen Luster, MD;  Location: [facility] ENDOSCOPY;  Service: Gastroenterology;  Laterality: N/A;  . HERNIA REPAIR     femoral  . HYSTEROSCOPY W/D&C N/A 05/07/2015   Procedure: DILATATION AND CURETTAGE /HYSTEROSCOPY;  Surgeon: Benjaman Kindler, MD;  Location: [facility] ORS;  Service: Gynecology;  Laterality: N/A;  . LAPAROSCOPIC TOTAL HYSTERECTOMY    . leg vein    . TONSILLECTOMY    . VARICOSE VEIN SURGERY     laser    Current Outpatient Prescriptions  Medication Sig Dispense Refill  . ALPRAZolam (XANAX) 0.5 MG tablet TAKE ONE TABLET BY MOUTH AT BEDTIME 30 tablet 5  . ergocalciferol (VITAMIN D2) 50000 units capsule Take 50,000 Units by mouth once a week.    . fluticasone (FLONASE) 50 MCG/ACT nasal spray Place 2 sprays into both nostrils daily. 48 g 1  . gabapentin (NEURONTIN) 300 MG capsule Take 2 capsules (600 mg total) by mouth at bedtime. 60 capsule 0  . ibuprofen (ADVIL,MOTRIN) 800 MG tablet Take 1 tablet (800  mg total) by mouth every 8 (eight) hours as needed for moderate pain. (Patient taking differently: Take 800 mg by mouth every 8 (eight) hours as needed for moderate pain. Takes 0.5 tabs as needed for pain) 30 tablet 0  . metoprolol (LOPRESSOR) 50 MG tablet TAKE 1 TABLET BY MOUTH DAILY (Patient taking differently: 1 tab prn) 30 tablet  5  . metoprolol succinate (TOPROL-XL) 50 MG 24 hr tablet Take 1 tablet (50 mg total) by mouth daily. 90 tablet 3  . valACYclovir (VALTREX) 500 MG tablet once as needed.     No current facility-administered medications for this visit.      Allergies:   Codeine sulfate and Duloxetine   Social History:  The patient  reports that she has never smoked. She has never used smokeless tobacco. She reports that she does not drink alcohol or use drugs.   Family History:   family history includes Colon cancer in her father; Heart attack in her father; Heart attack (age of onset: 37) in her paternal grandmother; Heart attack (age of onset: 77) in her paternal uncle; Heart disease in her father and paternal uncle.    Review of Systems: ROS   PHYSICAL EXAM: VS:  LMP 04/05/2014  , BMI There is no height or weight on file to calculate BMI. GEN: Well nourished, well developed, in no acute distress HEENT: normal Neck: no JVD, carotid bruits, or masses Cardiac: RRR; no murmurs, rubs, or gallops,no edema  Respiratory:  clear to auscultation bilaterally, normal work of breathing GI: soft, nontender, nondistended, + BS MS: no deformity or atrophy Skin: warm and dry, no rash Neuro:  Strength and sensation are intact Psych: euthymic mood, full affect    Recent Labs: 11/23/2015: ALT 20; BUN 12; Creatinine, Ser 0.69; Platelets 204; Potassium 4.1; Sodium 140; TSH 0.667    Lipid Panel Lab Results  Component Value Date   CHOL 249 (H) 11/23/2015   HDL 64 11/23/2015   LDLCALC 167 (H) 11/23/2015   TRIG 92 11/23/2015      Wt Readings from Last 3 Encounters:  07/14/16 119 lb (54 kg)  03/25/16 120 lb (54.4 kg)  02/04/16 119 lb 6.4 oz (54.2 kg)       ASSESSMENT AND PLAN:  No diagnosis found.   Disposition:   F/U  6 months  No orders of the defined types were placed in this encounter.    Signed, Esmond Plants, M.D., Ph.D. 09/23/2016  [place],  [place]

## 2016-10-02 DIAGNOSIS — L574 Cutis laxa senilis: Secondary | ICD-10-CM | POA: Diagnosis not present

## 2016-10-10 ENCOUNTER — Other Ambulatory Visit: Payer: Self-pay | Admitting: Internal Medicine

## 2016-10-10 DIAGNOSIS — G5793 Unspecified mononeuropathy of bilateral lower limbs: Secondary | ICD-10-CM

## 2016-10-28 ENCOUNTER — Encounter: Payer: Self-pay | Admitting: Cardiovascular Disease

## 2016-10-28 ENCOUNTER — Ambulatory Visit (INDEPENDENT_AMBULATORY_CARE_PROVIDER_SITE_OTHER): Payer: 59 | Admitting: Cardiovascular Disease

## 2016-10-28 VITALS — BP 104/62 | HR 85 | Ht 62.0 in | Wt 124.5 lb

## 2016-10-28 DIAGNOSIS — E782 Mixed hyperlipidemia: Secondary | ICD-10-CM | POA: Insufficient documentation

## 2016-10-28 DIAGNOSIS — G4700 Insomnia, unspecified: Secondary | ICD-10-CM | POA: Diagnosis not present

## 2016-10-28 DIAGNOSIS — I493 Ventricular premature depolarization: Secondary | ICD-10-CM

## 2016-10-28 DIAGNOSIS — R002 Palpitations: Secondary | ICD-10-CM

## 2016-10-28 DIAGNOSIS — I10 Essential (primary) hypertension: Secondary | ICD-10-CM

## 2016-10-28 DIAGNOSIS — F419 Anxiety disorder, unspecified: Secondary | ICD-10-CM

## 2016-10-28 MED ORDER — ROSUVASTATIN CALCIUM 5 MG PO TABS: 5.0000 mg | ORAL_TABLET | Freq: Every day | ORAL | 11 refills | Status: DC

## 2016-10-28 NOTE — Progress Notes (Signed)
Cardiology Office Note  Date:  10/28/2016   ID:  Carla Green, DOB 1961-07-02, MRN [id number]  PCP:  Halina Maidens, MD   Chief Complaint  Patient presents with  . other    1 yr f/u c/o chest tightness and sob. Meds reviewed verbally with pt.    HPI:  [name] is a very pleasant 55 year old woman, patient of Dr. Army Melia, who presents for follow-up of her palpitations  Anxiety issues s/p hysterectomy. problems with constipation  hormone therapy, on a patch Off zoloft Recent CT coronary calcium score of 0  Insomnia, takes xanax,melatonin, tylenol,. Still cant sleep Dad is sick, work and home stress  Is not taking metoprolol on a regular basis, only as needed at night (metoprolol 25 mg pill) Husband moved out  EKG on today's visit shows normal sinus rhythm with rate 85 bpm, no significant ST or T-wave changes  Other past medical history Previous problems with Insomnia. Palpitations more at nighttime. It was listening with a stethoscope Previous Lab work from primary care reviewed hemoglobin A1c 5.2, creatinine 0.73, hematocrit 39, TSH 1.0, total cholesterol 245, LDL 142 EKG from primary care August 2015 showing no ectopy otherwise normal EKG   long history of burning in her legs. Her symptoms seem to flareup more when she walks. She reports after a busy day at work, she is more symptomatic. She has tried Neurontin with no benefit.  She does have significant stressors. Mother passed away in 12-21-13. She helps to take care of her elderly father. Also has in-laws that she helps to take care of. She has some marital issues, also other stress at home, possibly children. This has caused significant anxiety for her She has started working night shifts   Previous stress echo at [facility] March 2011 which was normal. She exercised for 9 minutes, peak heart rate 169 beats per minute, normal echo at rest and stress   PMH:   has a past medical history of Anxiety;  Chickenpox; Complication of anesthesia; Depression; Dysrhythmia; History of kidney stones; IBS (irritable bowel syndrome); Insomnia; Menopausal state; Mixed hyperlipidemia; Neuropathy ([place]); Palpitations; PONV (postoperative nausea and vomiting); PVC (premature ventricular contraction); Uterine hyperplasia; and Varicose veins.  PSH:    Past Surgical History:  Procedure Laterality Date  . ABDOMINAL HYSTERECTOMY    . COLONOSCOPY    . COLONOSCOPY WITH PROPOFOL N/A 01/11/2016   Procedure: COLONOSCOPY WITH PROPOFOL;  Surgeon: Hulen Luster, MD;  Location: [facility] ENDOSCOPY;  Service: Gastroenterology;  Laterality: N/A;  . ESOPHAGOGASTRODUODENOSCOPY  2013   normal  . ESOPHAGOGASTRODUODENOSCOPY (EGD) WITH PROPOFOL N/A 01/11/2016   Procedure: ESOPHAGOGASTRODUODENOSCOPY (EGD) WITH PROPOFOL;  Surgeon: Hulen Luster, MD;  Location: [facility] ENDOSCOPY;  Service: Gastroenterology;  Laterality: N/A;  . eye brow surgery    . HERNIA REPAIR     femoral  . HYSTEROSCOPY W/D&C N/A 05/07/2015   Procedure: DILATATION AND CURETTAGE /HYSTEROSCOPY;  Surgeon: Benjaman Kindler, MD;  Location: [facility] ORS;  Service: Gynecology;  Laterality: N/A;  . LAPAROSCOPIC TOTAL HYSTERECTOMY    . leg vein    . TONSILLECTOMY    . VARICOSE VEIN SURGERY     laser    Current Outpatient Prescriptions  Medication Sig Dispense Refill  . ALPRAZolam (XANAX) 0.5 MG tablet TAKE ONE TABLET BY MOUTH AT BEDTIME 30 tablet 5  . fluticasone (FLONASE) 50 MCG/ACT nasal spray Place 2 sprays into both nostrils daily. 48 g 1  . ibuprofen (ADVIL,MOTRIN) 800 MG tablet Take 1 tablet (800 mg total)  by mouth every 8 (eight) hours as needed for moderate pain. (Patient taking differently: Take 800 mg by mouth every 8 (eight) hours as needed for moderate pain. Takes 0.5 tabs as needed for pain) 30 tablet 0  . metoprolol succinate (TOPROL-XL) 50 MG 24 hr tablet Take 1 tablet (50 mg total) by mouth daily. 90 tablet 3  . valACYclovir (VALTREX) 500 MG tablet once as needed.    .  rosuvastatin (CRESTOR) 5 MG tablet Take 1 tablet (5 mg total) by mouth daily. 30 tablet 11   No current facility-administered medications for this visit.      Allergies:   Codeine sulfate and Duloxetine   Social History:  The patient  reports that she has never smoked. She has never used smokeless tobacco. She reports that she does not drink alcohol or use drugs.   Family History:   family history includes Colon cancer in her father; Heart attack in her father; Heart attack (age of onset: 45) in her paternal grandmother; Heart attack (age of onset: 1) in her paternal uncle; Heart disease in her father and paternal uncle.    Review of Systems: Review of Systems  Constitutional: Negative.   Respiratory: Negative.   Cardiovascular: Negative.   Gastrointestinal: Negative.   Musculoskeletal: Negative.   Neurological: Negative.   Psychiatric/Behavioral: The patient is nervous/anxious.   All other systems reviewed and are negative.    PHYSICAL EXAM: VS:  BP 104/62 (BP Location: Left Arm, Patient Position: Sitting, Cuff Size: Normal)   Pulse 85   Ht 5\' 2"  (1.575 m)   Wt 124 lb 8 oz (56.5 kg)   LMP 04/05/2014   BMI 22.77 kg/m  , BMI Body mass index is 22.77 kg/m. GEN: Well nourished, well developed, in no acute distress  HEENT: normal  Neck: no JVD, carotid bruits, or masses Cardiac: RRR; no murmurs, rubs, or gallops,no edema  Respiratory:  clear to auscultation bilaterally, normal work of breathing GI: soft, nontender, nondistended, + BS MS: no deformity or atrophy  Skin: warm and dry, no rash Neuro:  Strength and sensation are intact Psych: euthymic mood, full affect    Recent Labs: 11/23/2015: ALT 20; BUN 12; Creatinine, Ser 0.69; Platelets 204; Potassium 4.1; Sodium 140; TSH 0.667    Lipid Panel Lab Results  Component Value Date   CHOL 249 (H) 11/23/2015   HDL 64 11/23/2015   LDLCALC 167 (H) 11/23/2015   TRIG 92 11/23/2015      Wt Readings from Last 3  Encounters:  10/28/16 124 lb 8 oz (56.5 kg)  07/14/16 119 lb (54 kg)  03/25/16 120 lb (54.4 kg)       ASSESSMENT AND PLAN:  Essential hypertension - Plan: EKG 12-Lead Blood pressure is well controlled on today's visit. No changes made to the medications.  Beat, premature ventricular - Plan: EKG 12-Lead Recommended if she has significant symptoms that she take metoprolol as needed  Anxiety Long discussion concerning family stressors, insomnia No longer on her Zoloft She will discuss this with primary care  Insomnia, unspecified type As above, she is taking Xanax, melatonin, other over-the-counter medications for sleep. Still unable to sleep well Father's sick, work stress, ex-husband living next door  Heart palpitations  Hyperlipidemia Long discussion concerning her elevated numbers, total cholesterol 245 She would like aggressive management of her cholesterol given strong family history of coronary disease Recommended she start Crestor 5 mg every other day to start She does have myalgias already, etiology unclear Recommended she try  coenzyme Q10   Total encounter time more than 25 minutes  Greater than 50% was spent in counseling and coordination of care with the patient   Disposition:   F/U  12 months as needed   Orders Placed This Encounter  Procedures  . EKG 12-Lead     Signed, Esmond Plants, M.D., Ph.D. 10/28/2016  [place], [place]

## 2016-10-28 NOTE — Patient Instructions (Addendum)
Medication Instructions:   try COQ10 for muscle aches Try crestor 5 mg every other day Slowly we would increase the dose   Labwork:  No new labs needed  Testing/Procedures:  No further testing at this time   I recommend watching educational videos on topics of interest to you at:       www.goemmi.com  Enter code: [email or web address]    Follow-Up: It was a pleasure seeing you in the office today. Please call us if you have new issues that need to be addressed before your next appt.  [phone]  Your physician wants you to follow-up in: 12 months.  You will receive a reminder letter in the mail two months in advance. If you don't receive a letter, please call our office to schedule the follow-up appointment.  If you need a refill on your cardiac medications before your next appointment, please call your pharmacy.

## 2016-11-06 ENCOUNTER — Telehealth: Payer: Self-pay | Admitting: Internal Medicine

## 2016-11-06 NOTE — Telephone Encounter (Signed)
[name] called asking if Dr. Army Melia will order a Bone Density for her before her CPE in January. She said her bones are aching, especially her knees. Please give her a phone call regarding this.  Pt's ph# [phone] Thank you.

## 2016-11-06 NOTE — Telephone Encounter (Signed)
Called pt back to let her know Dr. Army Melia not able to order Bone Density until CPE in January...and take Ibuprofen for pain

## 2016-11-28 DIAGNOSIS — L821 Other seborrheic keratosis: Secondary | ICD-10-CM | POA: Diagnosis not present

## 2016-11-28 DIAGNOSIS — L820 Inflamed seborrheic keratosis: Secondary | ICD-10-CM | POA: Diagnosis not present

## 2016-11-28 DIAGNOSIS — B078 Other viral warts: Secondary | ICD-10-CM | POA: Diagnosis not present

## 2016-12-09 ENCOUNTER — Other Ambulatory Visit: Payer: Self-pay | Admitting: Internal Medicine

## 2016-12-09 ENCOUNTER — Ambulatory Visit (INDEPENDENT_AMBULATORY_CARE_PROVIDER_SITE_OTHER): Payer: 59 | Admitting: Internal Medicine

## 2016-12-09 ENCOUNTER — Encounter: Payer: Self-pay | Admitting: Internal Medicine

## 2016-12-09 VITALS — BP 112/80 | HR 87 | Temp 97.4°F | Ht 62.0 in | Wt 125.0 lb

## 2016-12-09 DIAGNOSIS — Z0000 Encounter for general adult medical examination without abnormal findings: Secondary | ICD-10-CM

## 2016-12-09 DIAGNOSIS — M1612 Unilateral primary osteoarthritis, left hip: Secondary | ICD-10-CM | POA: Diagnosis not present

## 2016-12-09 DIAGNOSIS — I10 Essential (primary) hypertension: Secondary | ICD-10-CM | POA: Diagnosis not present

## 2016-12-09 DIAGNOSIS — F419 Anxiety disorder, unspecified: Secondary | ICD-10-CM | POA: Diagnosis not present

## 2016-12-09 DIAGNOSIS — M8580 Other specified disorders of bone density and structure, unspecified site: Secondary | ICD-10-CM | POA: Insufficient documentation

## 2016-12-09 DIAGNOSIS — E2839 Other primary ovarian failure: Secondary | ICD-10-CM

## 2016-12-09 DIAGNOSIS — E782 Mixed hyperlipidemia: Secondary | ICD-10-CM | POA: Diagnosis not present

## 2016-12-09 DIAGNOSIS — Z1231 Encounter for screening mammogram for malignant neoplasm of breast: Secondary | ICD-10-CM

## 2016-12-09 DIAGNOSIS — Z1239 Encounter for other screening for malignant neoplasm of breast: Secondary | ICD-10-CM

## 2016-12-09 LAB — POCT URINALYSIS DIPSTICK
BILIRUBIN UA: NEGATIVE
Blood, UA: NEGATIVE
GLUCOSE UA: NEGATIVE
KETONES UA: NEGATIVE
LEUKOCYTES UA: NEGATIVE
NITRITE UA: NEGATIVE
PH UA: 5
Protein, UA: NEGATIVE
Spec Grav, UA: <=1.005
Urobilinogen, UA: 0.2

## 2016-12-09 MED ORDER — ALPRAZOLAM 0.5 MG PO TABS: 0.5000 mg | ORAL_TABLET | Freq: Every day | ORAL | 1 refills | Status: DC

## 2016-12-09 NOTE — Progress Notes (Signed)
Date:  12/09/2016   Name:  Carla Green   DOB:  October 07, 1961   MRN:  [id number]   Chief Complaint: Annual Exam Carla Green is a 56 y.o. female who presents today for her Complete Annual Exam. She feels fairly well. She reports exercising intermittently. She reports she is sleeping fairly well now that her husband has moved out.  Hyperlipidemia  This is a chronic problem. Recent lipid tests were reviewed and are high. Pertinent negatives include no chest pain or shortness of breath. Current antihyperlipidemic treatment includes statins (on crestor but not taking consistently).  Hypertension  This is a chronic problem. The problem has been gradually improving since onset. Associated symptoms include anxiety and palpitations. Pertinent negatives include no chest pain, headaches or shortness of breath. Past treatments include beta blockers (taking metoprolol only as needed for palpitations). The current treatment provides significant improvement. There are no compliance problems.   Anxiety  Presents for follow-up visit. Symptoms include excessive worry, nervous/anxious behavior and palpitations. Patient reports no chest pain, dizziness or shortness of breath. Symptoms occur most days.    Hip Pain   There was no injury mechanism. The pain is present in the left hip. The quality of the pain is described as aching. The pain is mild. The pain has been fluctuating since onset. Pertinent negatives include no muscle weakness or numbness. She has tried NSAIDs for the symptoms. The treatment provided moderate relief.    Review of Systems  Constitutional: Negative for chills, fatigue and fever.  HENT: Negative for congestion, hearing loss, tinnitus, trouble swallowing and voice change.   Eyes: Negative for visual disturbance.  Respiratory: Negative for cough, chest tightness, shortness of breath and wheezing.   Cardiovascular: Positive for palpitations. Negative for chest pain and leg  swelling.  Gastrointestinal: Negative for abdominal pain, constipation, diarrhea and vomiting.  Endocrine: Negative for polydipsia and polyuria.  Genitourinary: Negative for dysuria, frequency, genital sores, vaginal bleeding and vaginal discharge.  Musculoskeletal: Positive for arthralgias (left hip pain/stiffness). Negative for gait problem and joint swelling.  Skin: Negative for color change and rash.  Neurological: Negative for dizziness, tremors, light-headedness, numbness and headaches.  Hematological: Negative for adenopathy. Does not bruise/bleed easily.  Psychiatric/Behavioral: Negative for dysphoric mood and sleep disturbance. The patient is nervous/anxious.     Patient Active Problem List   Diagnosis Date Noted  . Mixed hyperlipidemia 10/28/2016  . Neuropathic pain, leg, bilateral 07/14/2016  . Environmental and seasonal allergies 07/14/2016  . Heart palpitations 08/22/2015  . Anxiety 08/15/2014  . Essential hypertension 08/15/2014  . Irritable bowel syndrome with constipation 06/27/2014  . Cannot sleep 06/27/2014  . Neuritis or radiculitis due to rupture of lumbar intervertebral disc 06/27/2014  . Beat, premature ventricular 06/27/2014  . 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 Authorizing Provider  ALPRAZolam Duanne Moron) 0.5 MG tablet TAKE ONE TABLET BY MOUTH AT BEDTIME 07/15/16  Yes Glean Hess, MD  estradiol [facility] - DOSED IN MG/24 HR) 0.05 mg/24hr patch  11/13/16  Yes Historical Provider, MD  fluticasone (FLONASE) 50 MCG/ACT nasal spray Place 2 sprays into both nostrils daily. 07/14/16  Yes Glean Hess, MD  ibuprofen (ADVIL,MOTRIN) 800 MG tablet Take 1 tablet (800 mg total) by mouth every 8 (eight) hours as needed for moderate pain. Patient taking differently: Take 800 mg by mouth every 8 (eight) hours as needed for moderate pain. Takes 0.5 tabs as needed for pain  05/07/15  Yes Benjaman Kindler, MD    metoprolol succinate (TOPROL-XL) 50 MG 24 hr tablet Take 1 tablet (50 mg total) by mouth daily. 08/15/14  Yes Minna Merritts, MD  rosuvastatin (CRESTOR) 5 MG tablet Take 1 tablet (5 mg total) by mouth daily. 10/28/16 01/26/17 Yes Minna Merritts, MD    Allergies  Allergen Reactions  . Codeine Sulfate Itching  . Duloxetine Nausea Only    Past Surgical History:  Procedure Laterality Date  . ABDOMINAL HYSTERECTOMY    . COLONOSCOPY    . COLONOSCOPY WITH PROPOFOL N/A 01/11/2016   Procedure: COLONOSCOPY WITH PROPOFOL;  Surgeon: Hulen Luster, MD;  Location: [facility] ENDOSCOPY;  Service: Gastroenterology;  Laterality: N/A;  . ESOPHAGOGASTRODUODENOSCOPY  2013   normal  . ESOPHAGOGASTRODUODENOSCOPY (EGD) WITH PROPOFOL N/A 01/11/2016   Procedure: ESOPHAGOGASTRODUODENOSCOPY (EGD) WITH PROPOFOL;  Surgeon: Hulen Luster, MD;  Location: [facility] ENDOSCOPY;  Service: Gastroenterology;  Laterality: N/A;  . eye brow surgery    . HERNIA REPAIR     femoral  . HYSTEROSCOPY W/D&C N/A 05/07/2015   Procedure: DILATATION AND CURETTAGE /HYSTEROSCOPY;  Surgeon: Benjaman Kindler, MD;  Location: [facility] ORS;  Service: Gynecology;  Laterality: N/A;  . LAPAROSCOPIC TOTAL HYSTERECTOMY    . leg vein    . TONSILLECTOMY    . VARICOSE VEIN SURGERY     laser    Social History  Substance Use Topics  . Smoking status: Never Smoker  . Smokeless tobacco: Never Used  . Alcohol use No     Medication list has been reviewed and updated.   Physical Exam  Constitutional: She is oriented to person, place, and time. She appears well-developed and well-nourished. No distress.  HENT:  Head: Normocephalic and atraumatic.  Right Ear: Tympanic membrane and ear canal normal.  Left Ear: Tympanic membrane and ear canal normal.  Nose: Right sinus exhibits no maxillary sinus tenderness. Left sinus exhibits no maxillary sinus tenderness.  Mouth/Throat: Uvula is midline and oropharynx is clear and moist.  Eyes: Conjunctivae and EOM are normal.  Right eye exhibits no discharge. Left eye exhibits no discharge. No scleral icterus.  Neck: Normal range of motion. Carotid bruit is not present. No erythema present. No thyromegaly present.  Cardiovascular: Normal rate, regular rhythm, normal heart sounds and normal pulses.   Pulmonary/Chest: Effort normal. No respiratory distress. She has no wheezes. Right breast exhibits no mass, no nipple discharge, no skin change and no tenderness. Left breast exhibits no mass, no nipple discharge, no skin change and no tenderness.  Abdominal: Soft. Bowel sounds are normal. There is no hepatosplenomegaly. There is no tenderness. There is no CVA tenderness.  Musculoskeletal: Normal range of motion.       Right hip: She exhibits normal range of motion, normal strength and no tenderness.       Left hip: She exhibits normal range of motion, normal strength and no tenderness.  Lymphadenopathy:    She has no cervical adenopathy.    She has no axillary adenopathy.  Neurological: She is alert and oriented to person, place, and time. She has normal reflexes. No cranial nerve deficit or sensory deficit.  Skin: Skin is warm, dry and intact. No rash noted.  Psychiatric: She has a normal mood and affect. Her speech is normal and behavior is normal. Thought content normal.  Nursing note and vitals reviewed.   BP 112/80   Pulse 87   Temp 97.4 F (36.3 C)   Ht 5\' 2"  (1.575 m)   Wt  125 lb (56.7 kg)   LMP 04/05/2014   SpO2 97%   BMI 22.86 kg/m   Assessment and Plan: 1. Annual physical exam Normal exam - Hemoglobin A1c - POCT urinalysis dipstick  2. Breast cancer screening Pt declines mammogram this year  3. Essential hypertension Controlled on PRN metoprolol - CBC with Differential/Platelet - Comprehensive metabolic panel - TSH  4. Mixed hyperlipidemia Resume regular dose of statin - Lipid panel  5. Anxiety - ALPRAZolam (XANAX) 0.5 MG tablet; Take 1 tablet (0.5 mg total) by mouth at bedtime.   Dispense: 90 tablet; Refill: 1  6. Ovarian failure On HRT from GYN - DG Bone Density; Future - VITAMIN D 25 Hydroxy (Vit-D Deficiency, Fractures)  7. Arthritis of left hip Continue advil PRN Consider xrays or Ortho referral   Halina Maidens, MD [facility]  12/09/2016

## 2016-12-10 LAB — COMPREHENSIVE METABOLIC PANEL
ALBUMIN: 4.6 g/dL (ref 3.5–5.5)
ALT: 24 IU/L (ref 0–32)
AST: 19 IU/L (ref 0–40)
Albumin/Globulin Ratio: 2.2 (ref 1.2–2.2)
Alkaline Phosphatase: 49 IU/L (ref 39–117)
BILIRUBIN TOTAL: 0.6 mg/dL (ref 0.0–1.2)
BUN / CREAT RATIO: 20 (ref 9–23)
BUN: 11 mg/dL (ref 6–24)
CALCIUM: 9 mg/dL (ref 8.7–10.2)
CO2: 26 mmol/L (ref 18–29)
Chloride: 101 mmol/L (ref 96–106)
Creatinine, Ser: 0.54 mg/dL — ABNORMAL LOW (ref 0.57–1.00)
GFR, EST AFRICAN AMERICAN: 122 mL/min/{1.73_m2} (ref >59)
GFR, EST NON AFRICAN AMERICAN: 106 mL/min/{1.73_m2} (ref >59)
GLUCOSE: 89 mg/dL (ref 65–99)
Globulin, Total: 2.1 g/dL (ref 1.5–4.5)
Potassium: 4.3 mmol/L (ref 3.5–5.2)
Sodium: 141 mmol/L (ref 134–144)
TOTAL PROTEIN: 6.7 g/dL (ref 6.0–8.5)

## 2016-12-10 LAB — LIPID PANEL
CHOL/HDL RATIO: 4.2 ratio (ref 0.0–4.4)
Cholesterol, Total: 259 mg/dL — ABNORMAL HIGH (ref 100–199)
HDL: 62 mg/dL (ref >39)
LDL Calculated: 176 mg/dL — ABNORMAL HIGH (ref 0–99)
TRIGLYCERIDES: 105 mg/dL (ref 0–149)
VLDL Cholesterol Cal: 21 mg/dL (ref 5–40)

## 2016-12-10 LAB — CBC WITH DIFFERENTIAL/PLATELET
BASOS ABS: 0 10*3/uL (ref 0.0–0.2)
BASOS: 0 %
EOS (ABSOLUTE): 0.3 10*3/uL (ref 0.0–0.4)
Eos: 7 %
HEMOGLOBIN: 13 g/dL (ref 11.1–15.9)
Hematocrit: 40.6 % (ref 34.0–46.6)
IMMATURE GRANS (ABS): 0 10*3/uL (ref 0.0–0.1)
Immature Granulocytes: 0 %
LYMPHS: 26 %
Lymphocytes Absolute: 1.2 10*3/uL (ref 0.7–3.1)
MCH: 30 pg (ref 26.6–33.0)
MCHC: 32 g/dL (ref 31.5–35.7)
MCV: 94 fL (ref 79–97)
MONOCYTES: 6 %
Monocytes Absolute: 0.3 10*3/uL (ref 0.1–0.9)
NEUTROS ABS: 2.9 10*3/uL (ref 1.4–7.0)
NEUTROS PCT: 61 %
PLATELETS: 214 10*3/uL (ref 150–379)
RBC: 4.34 x10E6/uL (ref 3.77–5.28)
RDW: 13.4 % (ref 12.3–15.4)
WBC: 4.8 10*3/uL (ref 3.4–10.8)

## 2016-12-10 LAB — VITAMIN D 25 HYDROXY (VIT D DEFICIENCY, FRACTURES): Vit D, 25-Hydroxy: 37.9 ng/mL (ref 30.0–100.0)

## 2016-12-10 LAB — HEMOGLOBIN A1C
Est. average glucose Bld gHb Est-mCnc: 103 mg/dL
Hgb A1c MFr Bld: 5.2 % (ref 4.8–5.6)

## 2016-12-10 LAB — TSH: TSH: 0.93 u[IU]/mL (ref 0.450–4.500)

## 2016-12-16 ENCOUNTER — Ambulatory Visit: Payer: 59

## 2016-12-17 ENCOUNTER — Ambulatory Visit
Admission: RE | Admit: 2016-12-17 | Discharge: 2016-12-17 | Disposition: A | Discharge status: Home / Self Care | Payer: 59 | Source: Ambulatory Visit | Attending: Internal Medicine | Admitting: Internal Medicine

## 2016-12-17 ENCOUNTER — Encounter: Payer: Self-pay | Admitting: Internal Medicine

## 2016-12-17 DIAGNOSIS — M8580 Other specified disorders of bone density and structure, unspecified site: Secondary | ICD-10-CM | POA: Diagnosis not present

## 2016-12-17 DIAGNOSIS — H52223 Regular astigmatism, bilateral: Secondary | ICD-10-CM | POA: Diagnosis not present

## 2016-12-17 DIAGNOSIS — H5203 Hypermetropia, bilateral: Secondary | ICD-10-CM | POA: Diagnosis not present

## 2016-12-17 DIAGNOSIS — H524 Presbyopia: Secondary | ICD-10-CM | POA: Diagnosis not present

## 2016-12-17 DIAGNOSIS — E2839 Other primary ovarian failure: Secondary | ICD-10-CM

## 2016-12-17 DIAGNOSIS — M85851 Other specified disorders of bone density and structure, right thigh: Secondary | ICD-10-CM | POA: Diagnosis not present

## 2016-12-17 DIAGNOSIS — Z13820 Encounter for screening for osteoporosis: Secondary | ICD-10-CM | POA: Diagnosis not present

## 2016-12-17 DIAGNOSIS — Z780 Asymptomatic menopausal state: Secondary | ICD-10-CM | POA: Diagnosis not present

## 2016-12-17 DIAGNOSIS — M8588 Other specified disorders of bone density and structure, other site: Secondary | ICD-10-CM | POA: Diagnosis not present

## 2017-03-18 ENCOUNTER — Other Ambulatory Visit: Payer: Self-pay | Admitting: Obstetrics and Gynecology

## 2017-03-18 DIAGNOSIS — R102 Pelvic and perineal pain: Secondary | ICD-10-CM | POA: Diagnosis not present

## 2017-03-18 DIAGNOSIS — R1032 Left lower quadrant pain: Secondary | ICD-10-CM | POA: Diagnosis not present

## 2017-03-18 DIAGNOSIS — N760 Acute vaginitis: Secondary | ICD-10-CM | POA: Diagnosis not present

## 2017-03-23 ENCOUNTER — Ambulatory Visit
Admission: RE | Admit: 2017-03-23 | Discharge: 2017-03-23 | Disposition: A | Discharge status: Home / Self Care | Payer: 59 | Source: Ambulatory Visit | Attending: Obstetrics and Gynecology | Admitting: Obstetrics and Gynecology

## 2017-03-23 DIAGNOSIS — D7389 Other diseases of spleen: Secondary | ICD-10-CM | POA: Insufficient documentation

## 2017-03-23 DIAGNOSIS — Z90710 Acquired absence of both cervix and uterus: Secondary | ICD-10-CM | POA: Diagnosis not present

## 2017-03-23 DIAGNOSIS — Z90722 Acquired absence of ovaries, bilateral: Secondary | ICD-10-CM | POA: Insufficient documentation

## 2017-03-23 DIAGNOSIS — R1032 Left lower quadrant pain: Secondary | ICD-10-CM | POA: Diagnosis not present

## 2017-03-23 DIAGNOSIS — N200 Calculus of kidney: Secondary | ICD-10-CM | POA: Diagnosis not present

## 2017-03-23 DIAGNOSIS — K753 Granulomatous hepatitis, not elsewhere classified: Secondary | ICD-10-CM | POA: Insufficient documentation

## 2017-03-23 DIAGNOSIS — R109 Unspecified abdominal pain: Secondary | ICD-10-CM | POA: Diagnosis not present

## 2017-03-23 MED ORDER — IOPAMIDOL (ISOVUE-300) INJECTION 61%
125.0000 mL | Freq: Once | INTRAVENOUS | Status: AC | PRN
  Administered 2017-03-23: 125 mL via INTRAVENOUS

## 2017-03-24 DIAGNOSIS — L99 Other disorders of skin and subcutaneous tissue in diseases classified elsewhere: Secondary | ICD-10-CM | POA: Diagnosis not present

## 2017-04-14 ENCOUNTER — Ambulatory Visit (INDEPENDENT_AMBULATORY_CARE_PROVIDER_SITE_OTHER): Payer: 59 | Admitting: Physician Assistant

## 2017-04-14 ENCOUNTER — Encounter: Payer: Self-pay | Admitting: Physician Assistant

## 2017-04-14 VITALS — BP 128/74 | HR 88 | Ht 62.0 in | Wt 124.6 lb

## 2017-04-14 DIAGNOSIS — L259 Unspecified contact dermatitis, unspecified cause: Secondary | ICD-10-CM

## 2017-04-14 MED ORDER — PREDNISONE 10 MG (21) PO TBPK: ORAL_TABLET | ORAL | 0 refills | Status: DC

## 2017-04-14 NOTE — Patient Instructions (Signed)
   Contact Dermatitis Dermatitis is redness, soreness, and swelling (inflammation) of the skin. Contact dermatitis is a reaction to certain substances that touch the skin. You either touched something that irritated your skin, or you have allergies to something you touched. Follow these instructions at home: Skin Care   Moisturize your skin as needed.  Apply cool compresses to the affected areas.  Try taking a bath with:  Epsom salts. Follow the instructions on the package. You can get these at a pharmacy or grocery store.  Baking soda. Pour a small amount into the bath as told by your doctor.  Colloidal oatmeal. Follow the instructions on the package. You can get this at a pharmacy or grocery store.  Try applying baking soda paste to your skin. Stir water into baking soda until it looks like paste.  Do not scratch your skin.  Bathe less often.  Bathe in lukewarm water. Avoid using hot water. Medicines   Take or apply over-the-counter and prescription medicines only as told by your doctor.  If you were prescribed an antibiotic medicine, take or apply your antibiotic as told by your doctor. Do not stop taking the antibiotic even if your condition starts to get better. General instructions   Keep all follow-up visits as told by your doctor. This is important.  Avoid the substance that caused your reaction. If you do not know what caused it, keep a journal to try to track what caused it. Write down:  What you eat.  What cosmetic products you use.  What you drink.  What you wear in the affected area. This includes jewelry.  If you were given a bandage (dressing), take care of it as told by your doctor. This includes when to change and remove it. Contact a doctor if:  You do not get better with treatment.  Your condition gets worse.  You have signs of infection such as:  Swelling.  Tenderness.  Redness.  Soreness.  Warmth.  You have a fever.  You have new  symptoms. Get help right away if:  You have a very bad headache.  You have neck pain.  Your neck is stiff.  You throw up (vomit).  You feel very sleepy.  You see red streaks coming from the affected area.  Your bone or joint underneath the affected area becomes painful after the skin has healed.  The affected area turns darker.  You have trouble breathing. This information is not intended to replace advice given to you by your health care provider. Make sure you discuss any questions you have with your health care provider. Document Released: 08/31/2009 Document Revised: 04/10/2016 Document Reviewed: 03/21/2015 Elsevier Interactive Patient Education  2017 Elsevier Inc[employer]  

## 2017-04-14 NOTE — Progress Notes (Signed)
Subjective:    Patient ID: Carla Green, female    DOB: 1961/08/05, 56 y.o.   MRN: [id number]  Carla Green is a 56 y.o. 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 Broke out shortly after on Left arm. Now rash is spreading to chest.  Is not noticing its spreading to other arm. It itches and burns. 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 She reports yesterday she had a patient who was treated for scabies and she cleaned the linens. Scabies was not confirmed, she wore protective equipment and only touched the linens. Today she is breaking out in red spots on the flexural surfaces of her elbows and also her chest. She is having some itching and burning from this. No new lotions of detergents. Did wear a new shirt on Sunday. No fevers, chills.   Social History  Substance Use Topics  . Smoking status: Never Smoker  . Smokeless tobacco: Never Used  . Alcohol use No    Review of Systems Per HPI unless specifically indicated above     Objective:    BP 128/74 (BP Location: Right Arm, Patient Position: Sitting, Cuff Size: Normal)   Pulse 88   Ht 5\' 2"  (1.575 m)   Wt 124 lb 9.6 oz (56.5 kg)   LMP 04/05/2014   SpO2 97%   BMI 22.79 kg/m   Wt Readings from Last 3 Encounters:  04/14/17 124 lb 9.6 oz (56.5 kg)  12/09/16 125 lb (56.7 kg)  10/28/16 124 lb 8 oz (56.5 kg)    Physical Exam  Constitutional: She is oriented to person, place, and time. She appears well-developed and well-nourished. No distress.  Cardiovascular: Normal rate.   Pulmonary/Chest: Effort normal.  Neurological: She is alert and oriented to person, place, and time.  Skin: Skin is warm and dry. Rash noted. There is erythema.  Erythematous maculopapular lesions of flexural elbow surfaces and chest area. No vesicles, excoriations, burrows. Anterior wrists clear, no groin  lesions, no lesions on ankles.  Psychiatric: She has a normal mood and affect. Her behavior is normal.   Results for orders placed or performed in visit on 12/09/16  CBC with Differential/Platelet  Result Value Ref Range   WBC 4.8 3.4 - 10.8 x10E3/uL   RBC 4.34 3.77 - 5.28 x10E6/uL   Hemoglobin 13.0 11.1 - 15.9 g/dL   Hematocrit 40.6 34.0 - 46.6 %   MCV 94 79 - 97 fL   MCH 30.0 26.6 - 33.0 pg   MCHC 32.0 31.5 - 35.7 g/dL   RDW 13.4 12.3 - 15.4 %   Platelets 214 150 - 379 x10E3/uL   Neutrophils 61 Not Estab. %   Lymphs 26 Not Estab. %   Monocytes 6 Not Estab. %   Eos 7 Not Estab. %   Basos 0 Not Estab. %   Neutrophils Absolute 2.9 1.4 - 7.0 x10E3/uL   Lymphocytes Absolute 1.2 0.7 - 3.1 x10E3/uL   Monocytes Absolute 0.3 0.1 - 0.9 x10E3/uL   EOS (ABSOLUTE) 0.3 0.0 - 0.4 x10E3/uL   Basophils Absolute 0.0 0.0 - 0.2 x10E3/uL   Immature Granulocytes 0 Not Estab. %   Immature Grans (Abs) 0.0 0.0 - 0.1 x10E3/uL  Comprehensive metabolic panel  Result Value Ref Range   Glucose 89 65 - 99 mg/dL   BUN 11 6 -  24 mg/dL   Creatinine, Ser 0.54 (L) 0.57 - 1.00 mg/dL   GFR calc non Af Amer 106 >59 mL/min/1.73   GFR calc Af Amer 122 >59 mL/min/1.73   BUN/Creatinine Ratio 20 9 - 23   Sodium 141 134 - 144 mmol/L   Potassium 4.3 3.5 - 5.2 mmol/L   Chloride 101 96 - 106 mmol/L   CO2 26 18 - 29 mmol/L   Calcium 9.0 8.7 - 10.2 mg/dL   Total Protein 6.7 6.0 - 8.5 g/dL   Albumin 4.6 3.5 - 5.5 g/dL   Globulin, Total 2.1 1.5 - 4.5 g/dL   Albumin/Globulin Ratio 2.2 1.2 - 2.2   Bilirubin Total 0.6 0.0 - 1.2 mg/dL   Alkaline Phosphatase 49 39 - 117 IU/L   AST 19 0 - 40 IU/L   ALT 24 0 - 32 IU/L  Hemoglobin A1c  Result Value Ref Range   Hgb A1c MFr Bld 5.2 4.8 - 5.6 %   Est. average glucose Bld gHb Est-mCnc 103 mg/dL  Lipid panel  Result Value Ref Range   Cholesterol, Total 259 (H) 100 - 199 mg/dL   Triglycerides 105 0 - 149 mg/dL   HDL 62 >39 mg/dL   VLDL Cholesterol Cal 21 5 - 40 mg/dL    LDL Calculated 176 (H) 0 - 99 mg/dL   Chol/HDL Ratio 4.2 0.0 - 4.4 ratio units  TSH  Result Value Ref Range   TSH 0.930 0.450 - 4.500 uIU/mL  VITAMIN D 25 Hydroxy (Vit-D Deficiency, Fractures)  Result Value Ref Range   Vit D, 25-Hydroxy 37.9 30.0 - 100.0 ng/mL  POCT urinalysis dipstick  Result Value Ref Range   Color, UA yellow    Clarity, UA clear    Glucose, UA neg    Bilirubin, UA neg    Ketones, UA neg    Spec Grav, UA <=1.005    Blood, UA neg    pH, UA 5.0    Protein, UA neg    Urobilinogen, UA 0.2    Nitrite, UA neg    Leukocytes, UA Negative Negative      Assessment & Plan:   1. Contact dermatitis, unspecified contact dermatitis type, unspecified trigger  Does not match pattern of scabies. Looks more like contact or irritant dermatitis. Treat as below, please call if worsening or not improving.   - predniSONE (STERAPRED UNI-PAK 21 TAB) 10 MG (21) TBPK tablet; Take 6 pills on day 1, 5 pills on day 2, then 4, 3, 2, and 1  Dispense: 21 tablet; Refill: 0  Return if symptoms worsen or fail to improve.  Carles Collet, PA-C [facility] 04/14/2017, 2:23 PM

## 2017-06-01 ENCOUNTER — Other Ambulatory Visit: Payer: Self-pay | Admitting: Internal Medicine

## 2017-06-01 DIAGNOSIS — F419 Anxiety disorder, unspecified: Secondary | ICD-10-CM

## 2017-06-02 ENCOUNTER — Other Ambulatory Visit: Payer: Self-pay | Admitting: Internal Medicine

## 2017-06-02 ENCOUNTER — Telehealth: Payer: Self-pay

## 2017-06-02 DIAGNOSIS — F419 Anxiety disorder, unspecified: Secondary | ICD-10-CM

## 2017-06-02 DIAGNOSIS — G5793 Unspecified mononeuropathy of bilateral lower limbs: Secondary | ICD-10-CM

## 2017-06-02 MED ORDER — GABAPENTIN 300 MG PO CAPS: 300.0000 mg | ORAL_CAPSULE | Freq: Every day | ORAL | 0 refills | Status: DC

## 2017-06-02 NOTE — Telephone Encounter (Signed)
Having restless legs again- wanted to try it again to see if benefit's with another round.

## 2017-06-02 NOTE — Telephone Encounter (Signed)
Pt requested refill for Gabapentin- last prescribed by Dr.Berglund 12/207. Please Advise.

## 2017-06-02 NOTE — Telephone Encounter (Signed)
Sent to [facility] employee pharmacy.

## 2017-06-02 NOTE — Telephone Encounter (Signed)
She reported no benefit from this medication when it was prescribed a year ago.  I do not know why she is requesting a refill.

## 2017-06-02 NOTE — Telephone Encounter (Signed)
Pt informed

## 2017-06-12 ENCOUNTER — Telehealth: Payer: Self-pay

## 2017-06-12 NOTE — Telephone Encounter (Signed)
Pt calling stating she is using gabapentin for restless leg but it is not working. Would like to try a diff medication. Informed her she would need to be seen to change meds. She stated she will call back to schedule appt because she is currently busy.

## 2017-06-16 ENCOUNTER — Ambulatory Visit (INDEPENDENT_AMBULATORY_CARE_PROVIDER_SITE_OTHER): Payer: 59 | Admitting: Physician Assistant

## 2017-06-16 ENCOUNTER — Encounter: Payer: Self-pay | Admitting: Physician Assistant

## 2017-06-16 VITALS — BP 104/68 | HR 71 | Ht 62.0 in | Wt 121.0 lb

## 2017-06-16 DIAGNOSIS — G2581 Restless legs syndrome: Secondary | ICD-10-CM

## 2017-06-16 MED ORDER — ROPINIROLE HCL 0.25 MG PO TABS: 0.2500 mg | ORAL_TABLET | Freq: Every day | ORAL | 0 refills | Status: DC

## 2017-06-16 NOTE — Progress Notes (Signed)
Subjective:    Patient ID: Carla Green, female    DOB: 03-13-1961, 56 y.o.   MRN: [id number]  Carla Green is a 56 y.o. female presenting on 06/16/2017 for restless legs (Feelings during day and through the night. Feels like legs want to keep moving- if she doesn't move them they hurt. Gabpentin is not helping. She is also having calf cramps. )   HPI   Carla Green is a 56 y/o woman with history of restless legs on 300-600 mg gabapentin QHS presenting today for worsening RLS. She says she has urge to move, if she doesn't she has pain. It keeps her up at night. The symptoms improve with movement. She also has symptoms during the day to continuously move her legs. She has gabapentin which hasn't been working. She also takes Xanax 0.25 mg PRN at night for sleep, which she doesn't feel is effective. She denies symptoms of claudication, denies injuries. She is no longer menstruating. Labs 11/2016 showed no anemia.   Social History  Substance Use Topics  . Smoking status: Never Smoker  . Smokeless tobacco: Never Used  . Alcohol use No    Review of Systems Per HPI unless specifically indicated above     Objective:    BP 104/68   Pulse 71   Ht 5\' 2"  (1.575 m)   Wt 121 lb (54.9 kg)   LMP 04/05/2014   SpO2 100%   BMI 22.13 kg/m   Wt Readings from Last 3 Encounters:  06/16/17 121 lb (54.9 kg)  04/14/17 124 lb 9.6 oz (56.5 kg)  12/09/16 125 lb (56.7 kg)    Physical Exam Results for orders placed or performed in visit on 12/09/16  CBC with Differential/Platelet  Result Value Ref Range   WBC 4.8 3.4 - 10.8 x10E3/uL   RBC 4.34 3.77 - 5.28 x10E6/uL   Hemoglobin 13.0 11.1 - 15.9 g/dL   Hematocrit 40.6 34.0 - 46.6 %   MCV 94 79 - 97 fL   MCH 30.0 26.6 - 33.0 pg   MCHC 32.0 31.5 - 35.7 g/dL   RDW 13.4 12.3 - 15.4 %   Platelets 214 150 - 379 x10E3/uL   Neutrophils 61 Not Estab. %   Lymphs 26 Not Estab. %   Monocytes 6 Not Estab. %   Eos 7 Not Estab. %   Basos 0 Not  Estab. %   Neutrophils Absolute 2.9 1.4 - 7.0 x10E3/uL   Lymphocytes Absolute 1.2 0.7 - 3.1 x10E3/uL   Monocytes Absolute 0.3 0.1 - 0.9 x10E3/uL   EOS (ABSOLUTE) 0.3 0.0 - 0.4 x10E3/uL   Basophils Absolute 0.0 0.0 - 0.2 x10E3/uL   Immature Granulocytes 0 Not Estab. %   Immature Grans (Abs) 0.0 0.0 - 0.1 x10E3/uL  Comprehensive metabolic panel  Result Value Ref Range   Glucose 89 65 - 99 mg/dL   BUN 11 6 - 24 mg/dL   Creatinine, Ser 0.54 (L) 0.57 - 1.00 mg/dL   GFR calc non Af Amer 106 >59 mL/min/1.73   GFR calc Af Amer 122 >59 mL/min/1.73   BUN/Creatinine Ratio 20 9 - 23   Sodium 141 134 - 144 mmol/L   Potassium 4.3 3.5 - 5.2 mmol/L   Chloride 101 96 - 106 mmol/L   CO2 26 18 - 29 mmol/L   Calcium 9.0 8.7 - 10.2 mg/dL   Total Protein 6.7 6.0 - 8.5 g/dL   Albumin 4.6 3.5 - 5.5 g/dL   Globulin, Total 2.1 1.5 -  4.5 g/dL   Albumin/Globulin Ratio 2.2 1.2 - 2.2   Bilirubin Total 0.6 0.0 - 1.2 mg/dL   Alkaline Phosphatase 49 39 - 117 IU/L   AST 19 0 - 40 IU/L   ALT 24 0 - 32 IU/L  Hemoglobin A1c  Result Value Ref Range   Hgb A1c MFr Bld 5.2 4.8 - 5.6 %   Est. average glucose Bld gHb Est-mCnc 103 mg/dL  Lipid panel  Result Value Ref Range   Cholesterol, Total 259 (H) 100 - 199 mg/dL   Triglycerides 105 0 - 149 mg/dL   HDL 62 >39 mg/dL   VLDL Cholesterol Cal 21 5 - 40 mg/dL   LDL Calculated 176 (H) 0 - 99 mg/dL   Chol/HDL Ratio 4.2 0.0 - 4.4 ratio units  TSH  Result Value Ref Range   TSH 0.930 0.450 - 4.500 uIU/mL  VITAMIN D 25 Hydroxy (Vit-D Deficiency, Fractures)  Result Value Ref Range   Vit D, 25-Hydroxy 37.9 30.0 - 100.0 ng/mL  POCT urinalysis dipstick  Result Value Ref Range   Color, UA yellow    Clarity, UA clear    Glucose, UA neg    Bilirubin, UA neg    Ketones, UA neg    Spec Grav, UA <=1.005    Blood, UA neg    pH, UA 5.0    Protein, UA neg    Urobilinogen, UA 0.2    Nitrite, UA neg    Leukocytes, UA Negative Negative      Assessment & Plan:  1.  Restless leg syndrome  Not improving on gabepentin, benzodiazepines. Symptoms intolerable to patient. She is asking about requip, states she and Dr. Army Melia discussed this as a possibility. We can try very low dose one hour before bed. Counseled on adverse side effects, call back if experiencing these. D/C gabapentin. Try to skip Xanax while taking this in case of over sedation for first night. F/u 2 weeks with Dr. Army Melia for titration.  - rOPINIRole (REQUIP) 0.25 MG tablet; Take 1 tablet (0.25 mg total) by mouth at bedtime.  Dispense: 30 tablet; Refill: 0  Return in about 2 weeks (around 06/30/2017) for restless leg.  Carles Collet, PA-C [facility] 06/16/2017, 3:01 PM

## 2017-06-16 NOTE — Patient Instructions (Signed)

## 2017-07-23 ENCOUNTER — Ambulatory Visit (INDEPENDENT_AMBULATORY_CARE_PROVIDER_SITE_OTHER): Payer: 59 | Admitting: Internal Medicine

## 2017-07-23 ENCOUNTER — Encounter: Payer: Self-pay | Admitting: Internal Medicine

## 2017-07-23 VITALS — BP 120/62 | HR 87 | Temp 98.2°F | Ht 62.0 in | Wt 122.0 lb

## 2017-07-23 DIAGNOSIS — J40 Bronchitis, not specified as acute or chronic: Secondary | ICD-10-CM

## 2017-07-23 MED ORDER — LEVOFLOXACIN 500 MG PO TABS: 500.0000 mg | ORAL_TABLET | Freq: Every day | ORAL | 0 refills | Status: DC

## 2017-07-23 NOTE — Patient Instructions (Signed)

## 2017-07-23 NOTE — Progress Notes (Signed)
Date:  07/23/2017   Name:  Carla Green   DOB:  September 17, 1961   MRN:  [id number]   Chief Complaint: Chest Congestion (Started 6 days ago. Chest feels tight, weak on and off, and feels nauseous. No sinus drainage. States she took fathers levaquin medication (he is passed away) and helped but still thinks needs a little more. Coughing with no production. Coughing is mostly at night. ) Cough  This is a new problem. The current episode started in the past 7 days. The problem has been gradually worsening. The problem occurs every few minutes. The cough is non-productive. Associated symptoms include shortness of breath. Pertinent negatives include no chest pain, chills, fever, nasal congestion, rash, sore throat or wheezing. The symptoms are aggravated by lying down and exercise. She has tried OTC cough suppressant for the symptoms. The treatment provided moderate relief.  She took Levaquin 500 mg daily for the past 3 days from a previous prescription. She actually thinks she is feeling slightly better.    Review of Systems  Constitutional: Negative for chills and fever.  HENT: Negative for sore throat.   Respiratory: Positive for cough and shortness of breath. Negative for wheezing.   Cardiovascular: Negative for chest pain.  Skin: Negative for rash.    Patient Active Problem List   Diagnosis Date Noted  . Arthritis of left hip 12/09/2016  . Osteopenia determined by x-ray 12/09/2016  . Mixed hyperlipidemia 10/28/2016  . Neuropathic pain, leg, bilateral 07/14/2016  . Environmental and seasonal allergies 07/14/2016  . Heart palpitations 08/22/2015  . Anxiety 08/15/2014  . Essential hypertension 08/15/2014  . Irritable bowel syndrome with constipation 06/27/2014  . Cannot sleep 06/27/2014  . Neuritis or radiculitis due to rupture of lumbar intervertebral disc 06/27/2014  . Beat, premature ventricular 06/27/2014  . Phlebectasia 06/27/2014    Prior to Admission medications     Medication Sig Start Date End Date Taking? Authorizing Provider  ALPRAZolam Duanne Moron) 0.5 MG tablet TAKE 1 TABLET BY MOUTH NIGHTLY AT BEDTIME 06/02/17  Yes Glean Hess, MD  estradiol (CLIMARA - DOSED IN MG/24 HR) 0.05 mg/24hr patch  11/13/16  Yes [provider]  fluticasone (FLONASE) 50 MCG/ACT nasal spray Place 2 sprays into both nostrils daily. 07/14/16  Yes Glean Hess, MD  ibuprofen (ADVIL,MOTRIN) 800 MG tablet Take 1 tablet (800 mg total) by mouth every 8 (eight) hours as needed for moderate pain. Patient taking differently: Take 800 mg by mouth every 8 (eight) hours as needed for moderate pain. Takes 0.5 tabs as needed for pain 05/07/15  Yes Benjaman Kindler, MD  metoprolol succinate (TOPROL-XL) 50 MG 24 hr tablet Take 1 tablet (50 mg total) by mouth daily. 08/15/14  Yes Minna Merritts, MD  Omega-3 Fatty Acids (FISH OIL) 1000 MG CAPS Take by mouth.   Yes [provider]  gabapentin (NEURONTIN) 300 MG capsule Take 1-2 capsules (300-600 mg total) by mouth at bedtime. Patient not taking: Reported on 07/23/2017 06/02/17   Glean Hess, MD    Allergies  Allergen Reactions  . Codeine Sulfate Itching  . Duloxetine Nausea Only    Past Surgical History:  Procedure Laterality Date  . ABDOMINAL HYSTERECTOMY    . COLONOSCOPY    . COLONOSCOPY WITH PROPOFOL N/A 01/11/2016   Procedure: COLONOSCOPY WITH PROPOFOL;  Surgeon: Hulen Luster, MD;  Location: [facility] ENDOSCOPY;  Service: Gastroenterology;  Laterality: N/A;  . ESOPHAGOGASTRODUODENOSCOPY  2013   normal  . ESOPHAGOGASTRODUODENOSCOPY (EGD) WITH PROPOFOL N/A  01/11/2016   Procedure: ESOPHAGOGASTRODUODENOSCOPY (EGD) WITH PROPOFOL;  Surgeon: Hulen Luster, MD;  Location: [facility] ENDOSCOPY;  Service: Gastroenterology;  Laterality: N/A;  . eye brow surgery    . HERNIA REPAIR     femoral  . HYSTEROSCOPY W/D&C N/A 05/07/2015   Procedure: DILATATION AND CURETTAGE /HYSTEROSCOPY;  Surgeon: Benjaman Kindler, MD;  Location: [facility] ORS;   Service: Gynecology;  Laterality: N/A;  . LAPAROSCOPIC TOTAL HYSTERECTOMY    . leg vein    . TONSILLECTOMY    . VARICOSE VEIN SURGERY     laser    Social History  Substance Use Topics  . Smoking status: Never Smoker  . Smokeless tobacco: Never Used  . Alcohol use No     Medication list has been reviewed and updated.  PHQ 2/9 Scores 12/09/2016 03/25/2016  PHQ - 2 Score 0 0    Physical Exam  Constitutional: She is oriented to person, place, and time. She appears well-developed. No distress.  HENT:  Head: Normocephalic and atraumatic.  Right Ear: Tympanic membrane and ear canal normal.  Left Ear: Tympanic membrane and ear canal normal.  Nose: Right sinus exhibits no maxillary sinus tenderness. Left sinus exhibits no maxillary sinus tenderness.  Mouth/Throat: Posterior oropharyngeal erythema present. No posterior oropharyngeal edema.  Mild erythema c/w PND  Cardiovascular: Normal rate, regular rhythm and normal heart sounds.   Pulmonary/Chest: Effort normal and breath sounds normal. No respiratory distress. She has no wheezes.  Musculoskeletal: Normal range of motion.  Neurological: She is alert and oriented to person, place, and time.  Skin: Skin is warm and dry. No rash noted.  Psychiatric: She has a normal mood and affect. Her behavior is normal. Thought content normal.  Nursing note and vitals reviewed.   BP 120/62   Pulse 87   Temp 98.2 F (36.8 C)   Ht 5\' 2"  (1.575 m)   Wt 122 lb (55.3 kg)   LMP 04/05/2014   SpO2 97%   BMI 22.31 kg/m   Assessment and Plan: 1. Bronchitis Additional 7 days of Levaquin Continue Delsym otc - levofloxacin (LEVAQUIN) 500 MG tablet; Take 1 tablet (500 mg total) by mouth daily.  Dispense: 7 tablet; Refill: 0   Meds ordered this encounter  Medications  . levofloxacin (LEVAQUIN) 500 MG tablet    Sig: Take 1 tablet (500 mg total) by mouth daily.    Dispense:  7 tablet    Refill:  0    Partially dictated using Editor, commissioning. Any  errors are unintentional.  Halina Maidens, MD [facility]  07/23/2017

## 2017-07-27 DIAGNOSIS — M9905 Segmental and somatic dysfunction of pelvic region: Secondary | ICD-10-CM | POA: Diagnosis not present

## 2017-07-27 DIAGNOSIS — M9903 Segmental and somatic dysfunction of lumbar region: Secondary | ICD-10-CM | POA: Diagnosis not present

## 2017-07-27 DIAGNOSIS — M9904 Segmental and somatic dysfunction of sacral region: Secondary | ICD-10-CM | POA: Diagnosis not present

## 2017-07-27 DIAGNOSIS — M5413 Radiculopathy, cervicothoracic region: Secondary | ICD-10-CM | POA: Diagnosis not present

## 2017-07-27 DIAGNOSIS — M542 Cervicalgia: Secondary | ICD-10-CM | POA: Diagnosis not present

## 2017-07-27 DIAGNOSIS — M791 Myalgia: Secondary | ICD-10-CM | POA: Diagnosis not present

## 2017-07-27 DIAGNOSIS — M5441 Lumbago with sciatica, right side: Secondary | ICD-10-CM | POA: Diagnosis not present

## 2017-07-27 DIAGNOSIS — M9901 Segmental and somatic dysfunction of cervical region: Secondary | ICD-10-CM | POA: Diagnosis not present

## 2017-07-27 DIAGNOSIS — M5412 Radiculopathy, cervical region: Secondary | ICD-10-CM | POA: Diagnosis not present

## 2017-07-29 DIAGNOSIS — M5412 Radiculopathy, cervical region: Secondary | ICD-10-CM | POA: Diagnosis not present

## 2017-07-29 DIAGNOSIS — M9904 Segmental and somatic dysfunction of sacral region: Secondary | ICD-10-CM | POA: Diagnosis not present

## 2017-07-29 DIAGNOSIS — M9905 Segmental and somatic dysfunction of pelvic region: Secondary | ICD-10-CM | POA: Diagnosis not present

## 2017-07-29 DIAGNOSIS — M5441 Lumbago with sciatica, right side: Secondary | ICD-10-CM | POA: Diagnosis not present

## 2017-07-29 DIAGNOSIS — M791 Myalgia: Secondary | ICD-10-CM | POA: Diagnosis not present

## 2017-07-29 DIAGNOSIS — M5413 Radiculopathy, cervicothoracic region: Secondary | ICD-10-CM | POA: Diagnosis not present

## 2017-07-29 DIAGNOSIS — M9903 Segmental and somatic dysfunction of lumbar region: Secondary | ICD-10-CM | POA: Diagnosis not present

## 2017-07-29 DIAGNOSIS — M542 Cervicalgia: Secondary | ICD-10-CM | POA: Diagnosis not present

## 2017-07-29 DIAGNOSIS — M9901 Segmental and somatic dysfunction of cervical region: Secondary | ICD-10-CM | POA: Diagnosis not present

## 2017-07-31 DIAGNOSIS — M5412 Radiculopathy, cervical region: Secondary | ICD-10-CM | POA: Diagnosis not present

## 2017-07-31 DIAGNOSIS — M9901 Segmental and somatic dysfunction of cervical region: Secondary | ICD-10-CM | POA: Diagnosis not present

## 2017-07-31 DIAGNOSIS — M9904 Segmental and somatic dysfunction of sacral region: Secondary | ICD-10-CM | POA: Diagnosis not present

## 2017-07-31 DIAGNOSIS — M5441 Lumbago with sciatica, right side: Secondary | ICD-10-CM | POA: Diagnosis not present

## 2017-07-31 DIAGNOSIS — M542 Cervicalgia: Secondary | ICD-10-CM | POA: Diagnosis not present

## 2017-07-31 DIAGNOSIS — M9905 Segmental and somatic dysfunction of pelvic region: Secondary | ICD-10-CM | POA: Diagnosis not present

## 2017-07-31 DIAGNOSIS — M791 Myalgia: Secondary | ICD-10-CM | POA: Diagnosis not present

## 2017-07-31 DIAGNOSIS — M5413 Radiculopathy, cervicothoracic region: Secondary | ICD-10-CM | POA: Diagnosis not present

## 2017-07-31 DIAGNOSIS — M9903 Segmental and somatic dysfunction of lumbar region: Secondary | ICD-10-CM | POA: Diagnosis not present

## 2017-08-04 DIAGNOSIS — M9905 Segmental and somatic dysfunction of pelvic region: Secondary | ICD-10-CM | POA: Diagnosis not present

## 2017-08-04 DIAGNOSIS — M542 Cervicalgia: Secondary | ICD-10-CM | POA: Diagnosis not present

## 2017-08-04 DIAGNOSIS — M5412 Radiculopathy, cervical region: Secondary | ICD-10-CM | POA: Diagnosis not present

## 2017-08-04 DIAGNOSIS — M791 Myalgia: Secondary | ICD-10-CM | POA: Diagnosis not present

## 2017-08-04 DIAGNOSIS — M9903 Segmental and somatic dysfunction of lumbar region: Secondary | ICD-10-CM | POA: Diagnosis not present

## 2017-08-04 DIAGNOSIS — M9904 Segmental and somatic dysfunction of sacral region: Secondary | ICD-10-CM | POA: Diagnosis not present

## 2017-08-04 DIAGNOSIS — M5441 Lumbago with sciatica, right side: Secondary | ICD-10-CM | POA: Diagnosis not present

## 2017-08-04 DIAGNOSIS — M5413 Radiculopathy, cervicothoracic region: Secondary | ICD-10-CM | POA: Diagnosis not present

## 2017-08-04 DIAGNOSIS — M9901 Segmental and somatic dysfunction of cervical region: Secondary | ICD-10-CM | POA: Diagnosis not present

## 2017-08-24 DIAGNOSIS — M5413 Radiculopathy, cervicothoracic region: Secondary | ICD-10-CM | POA: Diagnosis not present

## 2017-08-24 DIAGNOSIS — M791 Myalgia: Secondary | ICD-10-CM | POA: Diagnosis not present

## 2017-08-24 DIAGNOSIS — M542 Cervicalgia: Secondary | ICD-10-CM | POA: Diagnosis not present

## 2017-08-24 DIAGNOSIS — M5412 Radiculopathy, cervical region: Secondary | ICD-10-CM | POA: Diagnosis not present

## 2017-08-24 DIAGNOSIS — M9903 Segmental and somatic dysfunction of lumbar region: Secondary | ICD-10-CM | POA: Diagnosis not present

## 2017-08-24 DIAGNOSIS — M9901 Segmental and somatic dysfunction of cervical region: Secondary | ICD-10-CM | POA: Diagnosis not present

## 2017-08-24 DIAGNOSIS — M5441 Lumbago with sciatica, right side: Secondary | ICD-10-CM | POA: Diagnosis not present

## 2017-08-24 DIAGNOSIS — M9905 Segmental and somatic dysfunction of pelvic region: Secondary | ICD-10-CM | POA: Diagnosis not present

## 2017-08-24 DIAGNOSIS — M9904 Segmental and somatic dysfunction of sacral region: Secondary | ICD-10-CM | POA: Diagnosis not present

## 2017-10-26 ENCOUNTER — Ambulatory Visit: Payer: 59 | Admitting: Cardiovascular Disease

## 2017-11-30 ENCOUNTER — Other Ambulatory Visit: Payer: Self-pay | Admitting: Internal Medicine

## 2017-11-30 DIAGNOSIS — F419 Anxiety disorder, unspecified: Secondary | ICD-10-CM

## 2018-01-23 DIAGNOSIS — J209 Acute bronchitis, unspecified: Secondary | ICD-10-CM | POA: Diagnosis not present

## 2018-01-23 DIAGNOSIS — J019 Acute sinusitis, unspecified: Secondary | ICD-10-CM | POA: Diagnosis not present

## 2018-01-24 NOTE — Progress Notes (Signed)
Cardiology Office Note  Date:  01/26/2018   ID:  Carla Green, DOB 26-Apr-1961, MRN [id number]  PCP:  Glean Hess, MD   Chief Complaint  Patient presents with  . OTHER    12 month f/u c/o chest pain. Meds reviewed verbally with pt.    HPI:  Carla Green is a very pleasant 57 year old woman with a history of Palpitations Anxiety issues s/p hysterectomy. 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 Still cant sleep  Previously on a statin, she is not taking this anymore  she is not on metoprolol feels her palpitations have gone away Husband moved out previously  EKG personally reviewed by myself on todays visit Shows normal sinus rhythm rate 78 bpm no significant ST or T wave changes  Other past medical history Previous problems with Insomnia. Palpitations more at nighttime. It was listening with a stethoscope Previous Lab work from primary care reviewed hemoglobin A1c 5.2, creatinine 0.73, hematocrit 39, TSH 1.0, total cholesterol 245, LDL 142 EKG from primary care August 2015 showing no ectopy otherwise normal EKG   long history of burning in her legs. Her symptoms seem to flareup more when she walks. She reports after a busy day at work, she is more symptomatic. She has tried Neurontin with no  benefit.  She does have significant stressors. Mother passed away in 12-21-13. She helps to take care of her elderly father. Also has in-laws that she helps to take care of. She has some marital issues, also other stress at home, possibly children. This has caused significant anxiety for her She has started working night shifts   Previous stress echo at [facility] March 2011 which was normal. She exercised for 9 minutes, peak heart rate 169 beats per minute, normal echo at rest and stress   PMH:   has a past medical history of Anxiety, Chickenpox, Complication of anesthesia, Depression, Dysrhythmia, History of kidney stones, IBS (irritable bowel syndrome), Insomnia, Menopausal state, Mixed hyperlipidemia, Neuropathy, Palpitations, PONV (postoperative nausea and vomiting), PVC (premature ventricular contraction), Uterine hyperplasia, and Varicose veins.  PSH:    Past Surgical History:  Procedure Laterality Date  . ABDOMINAL HYSTERECTOMY    . COLONOSCOPY    . COLONOSCOPY WITH PROPOFOL N/A 01/11/2016   Procedure: COLONOSCOPY WITH PROPOFOL;  Surgeon: Hulen Luster, MD;  Location: [facility] ENDOSCOPY;  Service: Gastroenterology;  Laterality: N/A;  . ESOPHAGOGASTRODUODENOSCOPY  2013   normal  . ESOPHAGOGASTRODUODENOSCOPY (EGD) WITH PROPOFOL N/A 01/11/2016   Procedure: ESOPHAGOGASTRODUODENOSCOPY (EGD) WITH PROPOFOL;  Surgeon: Hulen Luster, MD;  Location: [facility] ENDOSCOPY;  Service: Gastroenterology;  Laterality: N/A;  . eye brow surgery    . HERNIA REPAIR     femoral  . HYSTEROSCOPY W/D&C N/A 05/07/2015   Procedure: DILATATION AND CURETTAGE /HYSTEROSCOPY;  Surgeon: Benjaman Kindler, MD;  Location: [facility] ORS;  Service: Gynecology;  Laterality: N/A;  . LAPAROSCOPIC TOTAL HYSTERECTOMY    . leg vein    . TONSILLECTOMY    . VARICOSE VEIN SURGERY     laser    Current Outpatient Medications  Medication Sig Dispense Refill  . ALPRAZolam (XANAX) 0.5 MG tablet TAKE 1 TABLET BY MOUTH NIGHTLY AT BEDTIME 90  tablet 0  . fluticasone (FLONASE) 50 MCG/ACT nasal spray Place 2 sprays into both nostrils daily. 48 g 1  . ibuprofen (ADVIL,MOTRIN) 800 MG tablet Take 1 tablet (800 mg total) by mouth every 8 (eight) hours as needed for moderate pain. (Patient taking differently: Take 800 mg by mouth every 8 (eight) hours as needed for moderate pain. Takes 0.5 tabs as needed for pain) 30 tablet 0  . metoprolol succinate (TOPROL-XL) 50 MG 24 hr tablet Take 1 tablet (50 mg total) by mouth daily. (Patient taking differently: Take 50 mg by mouth daily as needed. ) 90 tablet 3  . Omega-3 Fatty Acids (FISH OIL) 1000 MG CAPS Take by mouth.     No current facility-administered medications for this visit.      Allergies:   Codeine sulfate and Duloxetine   Social History:  The patient  reports that  has never smoked. she has never used smokeless tobacco. She reports that she does not drink alcohol or use drugs.   Family History:   family history includes Colon cancer in her father; Heart attack in her father; Heart attack (age of onset: 50) in her paternal grandmother; Heart attack (age of onset: 21) in her paternal uncle; Heart disease in her father and paternal uncle.    Review of Systems: Review of Systems  Constitutional: Negative.   Respiratory: Negative.   Cardiovascular: Positive for chest pain.  Gastrointestinal: Negative.   Musculoskeletal: Negative.   Neurological: Negative.   Psychiatric/Behavioral: The patient is nervous/anxious.   All other systems reviewed and are negative.    PHYSICAL EXAM: VS:  BP 134/80 (BP Location: Left Arm, Patient Position: Sitting, Cuff Size: Normal)   Pulse 78   Ht 5\' 2"  (1.575 m)   Wt 123 lb 8 oz (56 kg)   LMP 04/05/2014   BMI 22.59 kg/m  , BMI Body mass index is 22.59 kg/m. Constitutional:  oriented to person, place, and time. No distress.  HENT:  Head: Normocephalic and atraumatic.  Eyes:  no discharge. No scleral icterus.  Neck: Normal range of motion.  Neck supple. No JVD present.  Cardiovascular: Normal rate, regular rhythm, normal heart sounds and intact distal pulses. Exam reveals no gallop and no friction rub. No edema No murmur heard. Pulmonary/Chest: Effort normal and breath sounds normal. No stridor. No respiratory distress.  no wheezes.  no rales.  no tenderness.  Abdominal: Soft.  no distension.  no tenderness.  Musculoskeletal: Normal range of motion.  no  tenderness or deformity.  Neurological:  normal muscle tone. Coordination normal. No atrophy Skin: Skin is warm and dry. No rash noted. not diaphoretic.  Psychiatric:  normal mood and affect. behavior is normal. Thought content normal.       Recent Labs: No results found for requested labs within last 8760 hours.    Lipid Panel Lab Results  Component Value Date   CHOL 259 (H) 12/09/2016   HDL 62 12/09/2016   LDLCALC 176 (H) 12/09/2016   TRIG 105 12/09/2016      Wt Readings from Last 3 Encounters:  01/26/18 123 lb 8 oz (56 kg)  07/23/17 122 lb (55.3 kg)  06/16/17 121 lb (54.9 kg)       ASSESSMENT AND PLAN:  Essential hypertension - Plan: EKG 12-Lead Blood pressure is well controlled on today's visit. No changes made to the medications. 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 Father died 1 year ago, still with some adjustment disorder   Hyperlipidemia Long discussion concerning her elevated numbers, total cholesterol 245 We previously prescribed Crestor but she is not taking this CT coronary calcium score images pulled up in the  office and reviewed with her in detail Minimal minimal descending aorta plaque but very insignificant Otherwise no coronary calcification or aortic plaque No strong indication for statins if she does not want to take it   Total encounter time more than 25 minutes  Greater than 50% was spent in counseling and coordination of care with the patient   Disposition:   F/U  12 months as needed   Orders Placed This Encounter  Procedures  . EKG 12-Lead     Signed, Esmond Plants, M.D., Ph.D. 01/26/2018  [place], [place]

## 2018-01-26 ENCOUNTER — Encounter: Payer: Self-pay | Admitting: Cardiovascular Disease

## 2018-01-26 ENCOUNTER — Ambulatory Visit (INDEPENDENT_AMBULATORY_CARE_PROVIDER_SITE_OTHER): Payer: 59 | Admitting: Cardiovascular Disease

## 2018-01-26 VITALS — BP 134/80 | HR 78 | Ht 62.0 in | Wt 123.5 lb

## 2018-01-26 DIAGNOSIS — E782 Mixed hyperlipidemia: Secondary | ICD-10-CM

## 2018-01-26 DIAGNOSIS — G4700 Insomnia, unspecified: Secondary | ICD-10-CM

## 2018-01-26 DIAGNOSIS — I10 Essential (primary) hypertension: Secondary | ICD-10-CM | POA: Diagnosis not present

## 2018-01-26 DIAGNOSIS — I493 Ventricular premature depolarization: Secondary | ICD-10-CM

## 2018-01-26 DIAGNOSIS — F419 Anxiety disorder, unspecified: Secondary | ICD-10-CM | POA: Diagnosis not present

## 2018-01-26 NOTE — Patient Instructions (Addendum)
Medication Instructions:   No medication changes made  Labwork:  No new labs needed  Testing/Procedures:  No further testing at this time   Follow-Up: It was a pleasure seeing you in the office today. Please call us if you have new issues that need to be addressed before your next appt.  [phone]  Your physician wants you to follow-up in: as needed You will receive a reminder letter in the mail two months in advance. If you don't receive a letter, please call our office to schedule the follow-up appointment.  If you need a refill on your cardiac medications before your next appointment, please call your pharmacy.  For educational health videos Log in to : [email or web address] Or : [email or web address], password : [email or web address]  

## 2018-02-04 DIAGNOSIS — H1789 Other corneal scars and opacities: Secondary | ICD-10-CM | POA: Diagnosis not present

## 2018-02-04 DIAGNOSIS — H524 Presbyopia: Secondary | ICD-10-CM | POA: Diagnosis not present

## 2018-02-04 DIAGNOSIS — H5203 Hypermetropia, bilateral: Secondary | ICD-10-CM | POA: Diagnosis not present

## 2018-02-04 DIAGNOSIS — H52223 Regular astigmatism, bilateral: Secondary | ICD-10-CM | POA: Diagnosis not present

## 2018-02-04 DIAGNOSIS — H04123 Dry eye syndrome of bilateral lacrimal glands: Secondary | ICD-10-CM | POA: Diagnosis not present

## 2018-02-05 ENCOUNTER — Ambulatory Visit
Admission: RE | Admit: 2018-02-05 | Discharge: 2018-02-05 | Disposition: A | Discharge status: Home / Self Care | Payer: 59 | Source: Ambulatory Visit | Attending: Internal Medicine | Admitting: Internal Medicine

## 2018-02-05 ENCOUNTER — Ambulatory Visit (INDEPENDENT_AMBULATORY_CARE_PROVIDER_SITE_OTHER): Payer: 59 | Admitting: Internal Medicine

## 2018-02-05 ENCOUNTER — Encounter: Payer: Self-pay | Admitting: Internal Medicine

## 2018-02-05 VITALS — BP 122/60 | HR 80 | Ht 62.0 in | Wt 125.0 lb

## 2018-02-05 DIAGNOSIS — M25552 Pain in left hip: Secondary | ICD-10-CM | POA: Diagnosis not present

## 2018-02-05 MED ORDER — PREDNISONE 10 MG PO TABS: ORAL_TABLET | ORAL | 0 refills | Status: DC

## 2018-02-05 NOTE — Progress Notes (Signed)
Date:  02/05/2018   Name:  Carla Green   DOB:  [birth year]   MRN:  [id number]   Chief Complaint: Hip Pain (Left Hip- Been here before for her hip. Started back up 3 weeks ago. Feels like does not want to get better. Radiating down into leg and foot. Throbbing pain.)  Hip Pain   There was no injury mechanism. The pain is present in the left hip. The pain is mild. The pain has been fluctuating since onset. Pertinent negatives include no inability to bear weight, loss of sensation, numbness or tingling. She reports no foreign bodies present. Nothing aggravates the symptoms. She has tried ice, heat and NSAIDs for the symptoms. The treatment provided mild relief.     Review of Systems  Constitutional: Negative for chills, fatigue and fever.  Respiratory: Negative for chest tightness and shortness of breath.   Cardiovascular: Negative for chest pain.  Musculoskeletal: Positive for arthralgias and myalgias. Negative for back pain, gait problem and joint swelling.  Neurological: Negative for tingling and numbness.    Patient Active Problem List   Diagnosis Date Noted  . Arthritis of left hip 12/09/2016  . Osteopenia determined by x-ray 12/09/2016  . Mixed hyperlipidemia 10/28/2016  . Neuropathic pain, leg, bilateral 07/14/2016  . Environmental and seasonal allergies 07/14/2016  . Heart palpitations 08/22/2015  . Anxiety 08/15/2014  . Essential hypertension 08/15/2014  . Irritable bowel syndrome with constipation 06/27/2014  . Cannot sleep 06/27/2014  . Neuritis or radiculitis due to rupture of lumbar intervertebral disc 06/27/2014  . Beat, premature ventricular 06/27/2014  . 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 Authorizing Provider  ALPRAZolam Duanne Moron) 0.5 MG tablet TAKE 1 TABLET BY MOUTH NIGHTLY AT BEDTIME 11/30/17  Yes Glean Hess, MD  fluticasone [facility]) 50 MCG/ACT nasal spray Place 2 sprays into both  nostrils daily. 07/14/16  Yes Glean Hess, MD  ibuprofen (ADVIL,MOTRIN) 800 MG tablet Take 1 tablet (800 mg total) by mouth every 8 (eight) hours as needed for moderate pain. Patient taking differently: Take 800 mg by mouth every 8 (eight) hours as needed for moderate pain. Takes 0.5 tabs as needed for pain 05/07/15  Yes Benjaman Kindler, MD  Omega-3 Fatty Acids (FISH OIL) 1000 MG CAPS Take by mouth.   Yes [provider]  metoprolol succinate (TOPROL-XL) 50 MG 24 hr tablet Take 1 tablet (50 mg total) by mouth daily. Patient not taking: Reported on 02/05/2018 08/15/14   Minna Merritts, MD    Allergies  Allergen Reactions  . Codeine Sulfate Itching  . Duloxetine Nausea Only    Past Surgical History:  Procedure Laterality Date  . ABDOMINAL HYSTERECTOMY    . COLONOSCOPY    . COLONOSCOPY WITH PROPOFOL N/A 01/11/2016   Procedure: COLONOSCOPY WITH PROPOFOL;  Surgeon: Hulen Luster, MD;  Location: [facility] ENDOSCOPY;  Service: Gastroenterology;  Laterality: N/A;  . ESOPHAGOGASTRODUODENOSCOPY  2013   normal  . ESOPHAGOGASTRODUODENOSCOPY (EGD) WITH PROPOFOL N/A 01/11/2016   Procedure: ESOPHAGOGASTRODUODENOSCOPY (EGD) WITH PROPOFOL;  Surgeon: Hulen Luster, MD;  Location: [facility] ENDOSCOPY;  Service: Gastroenterology;  Laterality: N/A;  . eye brow surgery    . HERNIA REPAIR     femoral  . HYSTEROSCOPY W/D&C N/A 05/07/2015   Procedure: DILATATION AND CURETTAGE /HYSTEROSCOPY;  Surgeon: Benjaman Kindler, MD;  Location: [facility] ORS;  Service: Gynecology;  Laterality: N/A;  . LAPAROSCOPIC TOTAL HYSTERECTOMY    . leg vein    .  TONSILLECTOMY    . VARICOSE VEIN SURGERY     laser    Social History   Tobacco Use  . Smoking status: Never Smoker  . Smokeless tobacco: Never Used  Substance Use Topics  . Alcohol use: No    Alcohol/week: 0.0 oz  . Drug use: No     Medication list has been reviewed and updated.  PHQ 2/9 Scores 12/09/2016 03/25/2016  PHQ - 2 Score 0 0    Physical Exam    Constitutional: She is oriented to person, place, and time. She appears well-developed. No distress.  HENT:  Head: Normocephalic and atraumatic.  Pulmonary/Chest: Effort normal. No respiratory distress.  Musculoskeletal: Normal range of motion.       Right hip: Normal.       Left hip: She exhibits tenderness (lateral hip). She exhibits normal range of motion, normal strength and no deformity.  Neurological: She is alert and oriented to person, place, and time.  Skin: Skin is warm and dry. No rash noted.  Psychiatric: She has a normal mood and affect. Her behavior is normal. Thought content normal.  Nursing note and vitals reviewed.   BP 122/60   Pulse 80   Ht 5\' 2"  (1.575 m)   Wt 125 lb (56.7 kg)   LMP 04/05/2014   SpO2 100%   BMI 22.86 kg/m   Assessment and Plan: 1. Pain of left hip joint Suspect bursitis or piriformis syndrome Continue tylenol, hold advil/aleve while on steroids - DG HIP UNILAT WITH PELVIS 2-3 VIEWS LEFT; Future - predniSONE (DELTASONE) 10 MG tablet; Take 6 on day 1, 5 on day 2, 4 on day 3, 3 on day 4, 2 on day 5 and 1 on day 1 then stop.  Dispense: 21 tablet; Refill: 0   Meds ordered this encounter  Medications  . predniSONE (DELTASONE) 10 MG tablet    Sig: Take 6 on day 1, 5 on day 2, 4 on day 3, 3 on day 4, 2 on day 5 and 1 on day 1 then stop.    Dispense:  21 tablet    Refill:  0    Partially dictated using Editor, commissioning. Any errors are unintentional.  Halina Maidens, MD [facility]  02/05/2018

## 2018-02-11 ENCOUNTER — Encounter: Payer: Self-pay | Admitting: Internal Medicine

## 2018-02-11 ENCOUNTER — Ambulatory Visit (INDEPENDENT_AMBULATORY_CARE_PROVIDER_SITE_OTHER): Payer: 59 | Admitting: Internal Medicine

## 2018-02-11 VITALS — BP 124/64 | HR 72 | Ht 62.0 in | Wt 124.0 lb

## 2018-02-11 DIAGNOSIS — D649 Anemia, unspecified: Secondary | ICD-10-CM | POA: Insufficient documentation

## 2018-02-11 DIAGNOSIS — D508 Other iron deficiency anemias: Secondary | ICD-10-CM

## 2018-02-11 DIAGNOSIS — Z0000 Encounter for general adult medical examination without abnormal findings: Secondary | ICD-10-CM

## 2018-02-11 DIAGNOSIS — E782 Mixed hyperlipidemia: Secondary | ICD-10-CM | POA: Diagnosis not present

## 2018-02-11 DIAGNOSIS — I493 Ventricular premature depolarization: Secondary | ICD-10-CM | POA: Diagnosis not present

## 2018-02-11 DIAGNOSIS — I10 Essential (primary) hypertension: Secondary | ICD-10-CM | POA: Diagnosis not present

## 2018-02-11 DIAGNOSIS — Z0001 Encounter for general adult medical examination with abnormal findings: Secondary | ICD-10-CM | POA: Diagnosis not present

## 2018-02-11 DIAGNOSIS — M25552 Pain in left hip: Secondary | ICD-10-CM

## 2018-02-11 DIAGNOSIS — M8580 Other specified disorders of bone density and structure, unspecified site: Secondary | ICD-10-CM

## 2018-02-11 DIAGNOSIS — F419 Anxiety disorder, unspecified: Secondary | ICD-10-CM | POA: Diagnosis not present

## 2018-02-11 LAB — POCT URINALYSIS DIPSTICK
Bilirubin, UA: NEGATIVE
Glucose, UA: NEGATIVE
Ketones, UA: NEGATIVE
LEUKOCYTES UA: NEGATIVE
NITRITE UA: NEGATIVE
PROTEIN UA: NEGATIVE
RBC UA: NEGATIVE
SPEC GRAV UA: 1.01 (ref 1.010–1.025)
Urobilinogen, UA: 0.2 E.U./dL
pH, UA: 6.5 (ref 5.0–8.0)

## 2018-02-11 NOTE — Progress Notes (Signed)
Date:  02/11/2018   Name:  Carla Green   DOB:  [birth year]   MRN:  [id number]   Chief Complaint: Annual Exam (Breast Exam. ) Carla Green is a 57 y.o. female who presents today for her Complete Annual Exam. She feels fairly well. She reports exercising some. She reports she is sleeping poorly due to hip pain.   Left hip - xrays were normal.  Prednisone taper did not help.  She has seen ortho in the past.  Does not feel that she is getting better but does not want to go to PT.  Hypertension  This is a chronic problem. The problem is controlled. Associated symptoms include chest pain and palpitations (controlled with PRN beta blocker). Pertinent negatives include no headaches or shortness of breath. Compliance problems include diet.   Hyperlipidemia  This is a chronic problem. Recent lipid tests were reviewed and are variable. Associated symptoms include chest pain. Pertinent negatives include no shortness of breath. Current antihyperlipidemic treatment includes herbal therapy (Omega-3 FA).   Osteopenia - not taking calcium or vitamin D.   Vitamin D has been low in the past.   Anemia - has been low in the past.  Unsure about iron levels.  B12 has been normal.  Review of Systems  Constitutional: Negative for chills, fatigue and fever.  HENT: Negative for congestion, hearing loss, tinnitus, trouble swallowing and voice change.   Eyes: Negative for visual disturbance.  Respiratory: Negative for cough, chest tightness, shortness of breath and wheezing.   Cardiovascular: Positive for chest pain, palpitations (controlled with PRN beta blocker) and leg swelling.  Gastrointestinal: Negative for abdominal pain, constipation, diarrhea and vomiting.  Endocrine: Negative for polydipsia and polyuria.  Genitourinary: Negative for dysuria, frequency, genital sores, vaginal bleeding and vaginal discharge.  Musculoskeletal: Positive for arthralgias. Negative for gait problem and joint swelling.  Skin:  Negative for color change and rash.  Neurological: Negative for dizziness, tremors, light-headedness and headaches.  Hematological: Negative for adenopathy. Does not bruise/bleed easily.  Psychiatric/Behavioral: Negative for dysphoric mood and sleep disturbance. The patient is not nervous/anxious.     Patient Active Problem List   Diagnosis Date Noted  . Arthritis of left hip 12/09/2016  . Osteopenia determined by x-ray 12/09/2016  . Mixed hyperlipidemia 10/28/2016  . Neuropathic pain, leg, bilateral 07/14/2016  . Environmental and seasonal allergies 07/14/2016  . Heart palpitations 08/22/2015  . Anxiety 08/15/2014  . Essential hypertension 08/15/2014  . Irritable bowel syndrome with constipation 06/27/2014  . Cannot sleep 06/27/2014  . Neuritis or radiculitis due to rupture of lumbar intervertebral disc 06/27/2014  . Beat, premature ventricular 06/27/2014  . 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 Authorizing Provider  ALPRAZolam Duanne Moron) 0.5 MG tablet TAKE 1 TABLET BY MOUTH NIGHTLY AT BEDTIME 11/30/17  Yes Glean Hess, MD  fluticasone [facility]) 50 MCG/ACT nasal spray Place 2 sprays into both nostrils daily. 07/14/16  Yes Glean Hess, MD  ibuprofen (ADVIL,MOTRIN) 800 MG tablet Take 1 tablet (800 mg total) by mouth every 8 (eight) hours as needed for moderate pain. Patient taking differently: Take 800 mg by mouth every 8 (eight) hours as needed for moderate pain. Takes 0.5 tabs as needed for pain 05/07/15  Yes Benjaman Kindler, MD  Omega-3 Fatty Acids (FISH OIL) 1000 MG CAPS Take by mouth.   Yes [provider]  metoprolol succinate (TOPROL-XL) 50 MG 24 hr tablet Take 1 tablet (  50 mg total) by mouth daily. Patient not taking: Reported on 02/11/2018 08/15/14   Minna Merritts, MD    Allergies  Allergen Reactions  . Codeine Sulfate Itching  . Duloxetine Nausea Only    Past Surgical History:    Procedure Laterality Date  . ABDOMINAL HYSTERECTOMY    . COLONOSCOPY    . COLONOSCOPY WITH PROPOFOL N/A 01/11/2016   Procedure: COLONOSCOPY WITH PROPOFOL;  Surgeon: Hulen Luster, MD;  Location: [facility] ENDOSCOPY;  Service: Gastroenterology;  Laterality: N/A;  . ESOPHAGOGASTRODUODENOSCOPY  2013   normal  . ESOPHAGOGASTRODUODENOSCOPY (EGD) WITH PROPOFOL N/A 01/11/2016   Procedure: ESOPHAGOGASTRODUODENOSCOPY (EGD) WITH PROPOFOL;  Surgeon: Hulen Luster, MD;  Location: [facility] ENDOSCOPY;  Service: Gastroenterology;  Laterality: N/A;  . eye brow surgery    . HERNIA REPAIR     femoral  . HYSTEROSCOPY W/D&C N/A 05/07/2015   Procedure: DILATATION AND CURETTAGE /HYSTEROSCOPY;  Surgeon: Benjaman Kindler, MD;  Location: [facility] ORS;  Service: Gynecology;  Laterality: N/A;  . LAPAROSCOPIC TOTAL HYSTERECTOMY    . leg vein    . TONSILLECTOMY    . VARICOSE VEIN SURGERY     laser    Social History   Tobacco Use  . Smoking status: Never Smoker  . Smokeless tobacco: Never Used  Substance Use Topics  . Alcohol use: No    Alcohol/week: 0.0 oz  . Drug use: No     Medication list has been reviewed and updated.  PHQ 2/9 Scores 12/09/2016 03/25/2016  PHQ - 2 Score 0 0    Physical Exam  Constitutional: She is oriented to person, place, and time. She appears well-developed and well-nourished. No distress.  HENT:  Head: Normocephalic and atraumatic.  Right Ear: Tympanic membrane and ear canal normal.  Left Ear: Tympanic membrane and ear canal normal.  Nose: Right sinus exhibits no maxillary sinus tenderness. Left sinus exhibits no maxillary sinus tenderness.  Mouth/Throat: Uvula is midline and oropharynx is clear and moist.  Eyes: Conjunctivae and EOM are normal. Right eye exhibits no discharge. Left eye exhibits no discharge. No scleral icterus.  Neck: Normal range of motion. Carotid bruit is not present. No erythema present. No thyromegaly present.  Cardiovascular: Normal rate, regular rhythm and normal pulses.  Exam reveals no gallop and no friction rub.  No murmur heard. Pulmonary/Chest: Effort normal. No respiratory distress. She has no wheezes. Right breast exhibits no mass, no nipple discharge, no skin change and no tenderness. Left breast exhibits no mass, no nipple discharge, no skin change and no tenderness.  Abdominal: Soft. Bowel sounds are normal. There is no hepatosplenomegaly. There is no tenderness. There is no CVA tenderness.  Musculoskeletal: Normal range of motion. She exhibits tenderness (left lateral hip).  Lymphadenopathy:    She has no cervical adenopathy.    She has no axillary adenopathy.  Neurological: She is alert and oriented to person, place, and time. She has normal reflexes. No cranial nerve deficit or sensory deficit.  Skin: Skin is warm, dry and intact. No rash noted.  Psychiatric: She has a normal mood and affect. Her speech is normal and behavior is normal. Thought content normal.  Nursing note and vitals reviewed.   BP 124/64   Pulse 72   Ht 5\' 2"  (1.575 m)   Wt 124 lb (56.2 kg)   LMP 04/05/2014   SpO2 97%   BMI 22.68 kg/m   Assessment and Plan: 1. Annual physical exam Pt declines mammogram Will continue self breast exams - POCT urinalysis  dipstick - Hemoglobin A1c  2. Essential hypertension controlled - CBC with Differential/Platelet - Comprehensive metabolic panel  3. Beat, premature ventricular - TSH  4. Anxiety intermittent  5. Mixed hyperlipidemia Continue Omega-3 - Lipid panel  6. Osteopenia determined by x-ray Check vitamin D Recommend Calcium supplements - VITAMIN D 25 Hydroxy (Vit-D Deficiency, Fractures)  7. Other iron deficiency anemia - Fe+TIBC+Fer  8. Pain of left hip joint Consider Ortho evaluation - pt can schedule her own appt   No orders of the defined types were placed in this encounter.   Partially dictated using Editor, commissioning. Any errors are unintentional.  Halina Maidens, MD [facility]  02/11/2018

## 2018-02-11 NOTE — Patient Instructions (Signed)

## 2018-02-12 LAB — CBC WITH DIFFERENTIAL/PLATELET
BASOS ABS: 0 10*3/uL (ref 0.0–0.2)
Basos: 1 %
EOS (ABSOLUTE): 0.3 10*3/uL (ref 0.0–0.4)
Eos: 7 %
Hematocrit: 37.4 % (ref 34.0–46.6)
Hemoglobin: 12.5 g/dL (ref 11.1–15.9)
Immature Grans (Abs): 0 10*3/uL (ref 0.0–0.1)
Immature Granulocytes: 0 %
LYMPHS ABS: 1.1 10*3/uL (ref 0.7–3.1)
Lymphs: 27 %
MCH: 31.1 pg (ref 26.6–33.0)
MCHC: 33.4 g/dL (ref 31.5–35.7)
MCV: 93 fL (ref 79–97)
Monocytes Absolute: 0.3 10*3/uL (ref 0.1–0.9)
Monocytes: 7 %
NEUTROS ABS: 2.3 10*3/uL (ref 1.4–7.0)
Neutrophils: 58 %
Platelets: 185 10*3/uL (ref 150–379)
RBC: 4.02 x10E6/uL (ref 3.77–5.28)
RDW: 13.5 % (ref 12.3–15.4)
WBC: 4 10*3/uL (ref 3.4–10.8)

## 2018-02-12 LAB — IRON,TIBC AND FERRITIN PANEL
FERRITIN: 72 ng/mL (ref 15–150)
Iron Saturation: 49 % (ref 15–55)
Iron: 141 ug/dL (ref 27–159)
Total Iron Binding Capacity: 290 ug/dL (ref 250–450)
UIBC: 149 ug/dL (ref 131–425)

## 2018-02-12 LAB — COMPREHENSIVE METABOLIC PANEL
ALBUMIN: 4.5 g/dL (ref 3.5–5.5)
ALK PHOS: 50 IU/L (ref 39–117)
ALT: 25 IU/L (ref 0–32)
AST: 16 IU/L (ref 0–40)
Albumin/Globulin Ratio: 2 (ref 1.2–2.2)
BILIRUBIN TOTAL: 0.6 mg/dL (ref 0.0–1.2)
BUN / CREAT RATIO: 21 (ref 9–23)
BUN: 15 mg/dL (ref 6–24)
CHLORIDE: 104 mmol/L (ref 96–106)
CO2: 24 mmol/L (ref 20–29)
CREATININE: 0.7 mg/dL (ref 0.57–1.00)
Calcium: 9.2 mg/dL (ref 8.7–10.2)
GFR calc Af Amer: 111 mL/min/{1.73_m2} (ref >59)
GFR calc non Af Amer: 96 mL/min/{1.73_m2} (ref >59)
GLOBULIN, TOTAL: 2.3 g/dL (ref 1.5–4.5)
GLUCOSE: 83 mg/dL (ref 65–99)
Potassium: 4.1 mmol/L (ref 3.5–5.2)
SODIUM: 143 mmol/L (ref 134–144)
Total Protein: 6.8 g/dL (ref 6.0–8.5)

## 2018-02-12 LAB — LIPID PANEL
CHOLESTEROL TOTAL: 256 mg/dL — AB (ref 100–199)
Chol/HDL Ratio: 4 ratio (ref 0.0–4.4)
HDL: 64 mg/dL (ref >39)
LDL CALC: 174 mg/dL — AB (ref 0–99)
TRIGLYCERIDES: 91 mg/dL (ref 0–149)
VLDL Cholesterol Cal: 18 mg/dL (ref 5–40)

## 2018-02-12 LAB — VITAMIN D 25 HYDROXY (VIT D DEFICIENCY, FRACTURES): Vit D, 25-Hydroxy: 29.8 ng/mL — ABNORMAL LOW (ref 30.0–100.0)

## 2018-02-12 LAB — TSH: TSH: 0.96 u[IU]/mL (ref 0.450–4.500)

## 2018-02-12 LAB — HEMOGLOBIN A1C
ESTIMATED AVERAGE GLUCOSE: 105 mg/dL
HEMOGLOBIN A1C: 5.3 % (ref 4.8–5.6)

## 2018-03-01 ENCOUNTER — Other Ambulatory Visit: Payer: Self-pay | Admitting: Internal Medicine

## 2018-03-01 DIAGNOSIS — F419 Anxiety disorder, unspecified: Secondary | ICD-10-CM

## 2018-03-26 DIAGNOSIS — L739 Follicular disorder, unspecified: Secondary | ICD-10-CM | POA: Diagnosis not present

## 2018-03-26 DIAGNOSIS — L718 Other rosacea: Secondary | ICD-10-CM | POA: Diagnosis not present

## 2018-03-26 DIAGNOSIS — L309 Dermatitis, unspecified: Secondary | ICD-10-CM | POA: Diagnosis not present

## 2018-04-07 DIAGNOSIS — M5432 Sciatica, left side: Secondary | ICD-10-CM | POA: Diagnosis not present

## 2018-06-08 DIAGNOSIS — H43812 Vitreous degeneration, left eye: Secondary | ICD-10-CM | POA: Diagnosis not present

## 2018-07-09 ENCOUNTER — Other Ambulatory Visit: Payer: Self-pay | Admitting: Physical Medicine and Rehabilitation

## 2018-07-09 DIAGNOSIS — M25552 Pain in left hip: Secondary | ICD-10-CM | POA: Diagnosis not present

## 2018-07-09 DIAGNOSIS — M5416 Radiculopathy, lumbar region: Secondary | ICD-10-CM | POA: Diagnosis not present

## 2018-07-12 ENCOUNTER — Other Ambulatory Visit: Payer: Self-pay | Admitting: Physical Medicine and Rehabilitation

## 2018-07-12 DIAGNOSIS — M25552 Pain in left hip: Secondary | ICD-10-CM

## 2018-07-26 ENCOUNTER — Ambulatory Visit: Payer: 59

## 2018-07-30 ENCOUNTER — Other Ambulatory Visit: Payer: Self-pay | Admitting: Physical Medicine and Rehabilitation

## 2018-07-30 ENCOUNTER — Ambulatory Visit
Admission: RE | Admit: 2018-07-30 | Discharge: 2018-07-30 | Disposition: A | Discharge status: Home / Self Care | Payer: 59 | Source: Ambulatory Visit | Attending: Physical Medicine and Rehabilitation | Admitting: Physical Medicine and Rehabilitation

## 2018-07-30 DIAGNOSIS — M25552 Pain in left hip: Secondary | ICD-10-CM

## 2018-08-09 ENCOUNTER — Ambulatory Visit: Payer: 59 | Admitting: Internal Medicine

## 2018-08-09 ENCOUNTER — Encounter: Payer: Self-pay | Admitting: Internal Medicine

## 2018-08-09 VITALS — BP 140/72 | HR 79 | Temp 98.1°F | Ht 62.0 in | Wt 122.0 lb

## 2018-08-09 DIAGNOSIS — J0100 Acute maxillary sinusitis, unspecified: Secondary | ICD-10-CM

## 2018-08-09 DIAGNOSIS — I10 Essential (primary) hypertension: Secondary | ICD-10-CM

## 2018-08-09 DIAGNOSIS — F419 Anxiety disorder, unspecified: Secondary | ICD-10-CM

## 2018-08-09 MED ORDER — DOXYCYCLINE HYCLATE 100 MG PO TABS: 100.0000 mg | ORAL_TABLET | Freq: Two times a day (BID) | ORAL | 0 refills | Status: AC

## 2018-08-09 MED ORDER — SERTRALINE HCL 50 MG PO TABS: 50.0000 mg | ORAL_TABLET | Freq: Every day | ORAL | 2 refills | Status: DC

## 2018-08-09 NOTE — Progress Notes (Signed)
Date:  08/09/2018   Name:  Carla Green   DOB:  [birth year]   MRN:  [id number]   Chief Complaint: Anxiety (New- started after father passed away 1.5 years ago. Progressively getting worse. GAD7- 21. PHQ9- 0 ) and Ear Pain (Left ear pain and sore throat. Started week ago. No drainage. Pain in the inside of ear. No fever or chills. )  Anxiety  Presents for follow-up visit. Symptoms include chest pain, feeling of choking, muscle tension and nervous/anxious behavior. Patient reports no dizziness or shortness of breath. Symptoms occur constantly. The quality of sleep is fair.    Otalgia   There is pain in the left ear. This is a new problem. The current episode started in the past 7 days. The problem occurs constantly. The problem has been unchanged. There has been no fever. The pain is mild. Pertinent negatives include no abdominal pain, coughing or headaches.  Hypertension  This is a chronic problem. The problem is controlled. Associated symptoms include anxiety and chest pain. Pertinent negatives include no headaches or shortness of breath. Past treatments include beta blockers. The current treatment provides significant improvement.   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  Review of Systems  Constitutional: Negative for chills, fatigue and fever.  HENT: Positive for congestion, ear pain and sinus pressure.   Respiratory: Negative for cough, chest tightness, shortness of breath and wheezing.   Cardiovascular: Positive for chest pain.  Gastrointestinal: Negative for abdominal pain.  Neurological: Negative for dizziness and headaches.  Hematological: Negative for adenopathy.  Psychiatric/Behavioral: Positive for sleep disturbance. Negative for dysphoric mood.  The patient is nervous/anxious.     Patient Active Problem List   Diagnosis Date Noted  . Anemia 02/11/2018  . Pain of left hip joint 02/11/2018  . Arthritis of left hip 12/09/2016  . Osteopenia determined by x-ray 12/09/2016  . Mixed hyperlipidemia 10/28/2016  . Neuropathic pain, leg, bilateral 07/14/2016  . Environmental and seasonal allergies 07/14/2016  . Heart palpitations 08/22/2015  . Anxiety 08/15/2014  . Essential hypertension 08/15/2014  . Irritable bowel syndrome with constipation 06/27/2014  . Cannot sleep 06/27/2014  . Neuritis or radiculitis due to rupture of lumbar intervertebral disc 06/27/2014  . Beat, premature ventricular 06/27/2014  . Phlebectasia 06/27/2014    Allergies  Allergen Reactions  . Codeine Sulfate Itching  . Duloxetine Nausea Only    Past Surgical History:  Procedure Laterality Date  . ABDOMINAL HYSTERECTOMY    . COLONOSCOPY    . COLONOSCOPY WITH PROPOFOL N/A 01/11/2016   Procedure: COLONOSCOPY WITH PROPOFOL;  Surgeon: Hulen Luster, MD;  Location: [facility] ENDOSCOPY;  Service: Gastroenterology;  Laterality: N/A;  . ESOPHAGOGASTRODUODENOSCOPY  2013   normal  . ESOPHAGOGASTRODUODENOSCOPY (EGD) WITH PROPOFOL N/A 01/11/2016   Procedure: ESOPHAGOGASTRODUODENOSCOPY (EGD) WITH PROPOFOL;  Surgeon: Hulen Luster, MD;  Location: [facility] ENDOSCOPY;  Service: Gastroenterology;  Laterality: N/A;  . eye brow surgery    . HERNIA REPAIR     femoral  . HYSTEROSCOPY W/D&C N/A 05/07/2015   Procedure: DILATATION AND CURETTAGE /HYSTEROSCOPY;  Surgeon: Benjaman Kindler, MD;  Location: [facility] ORS;  Service: Gynecology;  Laterality: N/A;  . LAPAROSCOPIC TOTAL HYSTERECTOMY    . leg vein    . TONSILLECTOMY    .  VARICOSE VEIN SURGERY     laser    Social History   Tobacco Use  . Smoking status: Never Smoker  . Smokeless tobacco: Never Used  Substance Use Topics  . Alcohol use: No    Alcohol/week: 0.0 standard drinks  . Drug use: No     Medication list has been reviewed  and updated.  Current Meds  Medication Sig  . ALPRAZolam (XANAX) 0.5 MG tablet TAKE ONE TABLET BY MOUTH EACH NIGHT AT BEDTIME  . estradiol (VIVELLE-DOT) 0.05 MG/24HR patch Place 1 patch onto the skin once a week.  . fluticasone (FLONASE) 50 MCG/ACT nasal spray Place 2 sprays into both nostrils daily.  Marland Kitchen ibuprofen (ADVIL,MOTRIN) 800 MG tablet Take 1 tablet (800 mg total) by mouth every 8 (eight) hours as needed for moderate pain. (Patient taking differently: Take 800 mg by mouth every 8 (eight) hours as needed for moderate pain. Takes 0.5 tabs as needed for pain)  . metoprolol succinate (TOPROL-XL) 50 MG 24 hr tablet Take 1 tablet (50 mg total) by mouth daily.  . Omega-3 Fatty Acids (FISH OIL) 1000 MG CAPS Take by mouth.    PHQ 2/9 Scores 08/09/2018 12/09/2016 03/25/2016  PHQ - 2 Score 0 0 0    Physical Exam  Constitutional: She is oriented to person, place, and time. She appears well-developed and well-nourished.  HENT:  Right Ear: External ear and ear canal normal. Tympanic membrane is not erythematous and not retracted.  Left Ear: External ear and ear canal normal. Tympanic membrane is not erythematous and not retracted.  Nose: Right sinus exhibits no maxillary sinus tenderness and no frontal sinus tenderness. Left sinus exhibits no maxillary sinus tenderness and no frontal sinus tenderness.  Mouth/Throat: Uvula is midline and mucous membranes are normal. No oral lesions. Posterior oropharyngeal erythema present. No oropharyngeal exudate.  Neck: Normal range of motion. Neck supple.  Cardiovascular: Normal rate, regular rhythm and normal heart sounds.  Pulmonary/Chest: Effort normal and breath sounds normal. She has no wheezes. She has no rales.  Lymphadenopathy:    She has no cervical adenopathy.  Neurological: She is alert and oriented to person, place, and time.    BP 140/72 (BP Location: Right Arm, Patient Position: Sitting, Cuff Size: Normal)   Pulse 79   Temp 98.1 F (36.7 C)  (Oral)   Ht 5\' 2"  (1.575 m)   Wt 122 lb (55.3 kg)   LMP 04/05/2014   SpO2 99%   BMI 22.31 kg/m   Assessment and Plan: 1. Acute non-recurrent maxillary sinusitis Continue sudafed - doxycycline (VIBRA-TABS) 100 MG tablet; Take 1 tablet (100 mg total) by mouth 2 (two) times daily for 10 days.  Dispense: 20 tablet; Refill: 0  2. Anxiety Continue alprazolam for sleep Begin zoloft 25 mg daily but increase to 50 mg if no side effects after 1-2 weeks - sertraline (ZOLOFT) 50 MG tablet; Take 1 tablet (50 mg total) by mouth daily.  Dispense: 30 tablet; Refill: 2  3. Essential hypertension controlled   Partially dictated using Editor, commissioning. Any errors are unintentional.  Halina Maidens, MD [facility]  08/09/2018

## 2018-08-12 ENCOUNTER — Ambulatory Visit
Admission: RE | Admit: 2018-08-12 | Discharge: 2018-08-12 | Disposition: A | Discharge status: Home / Self Care | Payer: 59 | Source: Ambulatory Visit | Attending: Physical Medicine and Rehabilitation | Admitting: Physical Medicine and Rehabilitation

## 2018-08-12 DIAGNOSIS — M5416 Radiculopathy, lumbar region: Secondary | ICD-10-CM | POA: Insufficient documentation

## 2018-08-12 DIAGNOSIS — M7062 Trochanteric bursitis, left hip: Secondary | ICD-10-CM | POA: Insufficient documentation

## 2018-08-12 DIAGNOSIS — M160 Bilateral primary osteoarthritis of hip: Secondary | ICD-10-CM | POA: Diagnosis not present

## 2018-08-12 DIAGNOSIS — M7061 Trochanteric bursitis, right hip: Secondary | ICD-10-CM | POA: Insufficient documentation

## 2018-08-12 DIAGNOSIS — M25552 Pain in left hip: Secondary | ICD-10-CM | POA: Insufficient documentation

## 2018-08-26 ENCOUNTER — Other Ambulatory Visit: Payer: Self-pay | Admitting: Internal Medicine

## 2018-08-26 DIAGNOSIS — F419 Anxiety disorder, unspecified: Secondary | ICD-10-CM

## 2018-09-06 DIAGNOSIS — M1612 Unilateral primary osteoarthritis, left hip: Secondary | ICD-10-CM | POA: Diagnosis not present

## 2018-09-10 ENCOUNTER — Other Ambulatory Visit: Payer: Self-pay | Admitting: Internal Medicine

## 2018-09-10 ENCOUNTER — Telehealth: Payer: Self-pay

## 2018-09-10 MED ORDER — ESTRADIOL 0.05 MG/24HR TD PTTW: 1.0000 | MEDICATED_PATCH | TRANSDERMAL | 5 refills | Status: DC

## 2018-09-10 NOTE — Telephone Encounter (Signed)
Patient requesting to get Estradiol patches sent in. [facility]  Thank you.

## 2018-09-23 ENCOUNTER — Ambulatory Visit: Payer: 59 | Admitting: Internal Medicine

## 2018-09-23 ENCOUNTER — Encounter: Payer: Self-pay | Admitting: Internal Medicine

## 2018-09-23 VITALS — BP 117/78 | HR 90 | Resp 16 | Ht 62.0 in | Wt 119.6 lb

## 2018-09-23 DIAGNOSIS — F411 Generalized anxiety disorder: Secondary | ICD-10-CM | POA: Diagnosis not present

## 2018-09-23 DIAGNOSIS — R0981 Nasal congestion: Secondary | ICD-10-CM | POA: Diagnosis not present

## 2018-09-23 MED ORDER — DOXYCYCLINE HYCLATE 100 MG PO TABS: 100.0000 mg | ORAL_TABLET | Freq: Two times a day (BID) | ORAL | 0 refills | Status: AC

## 2018-09-23 NOTE — Patient Instructions (Signed)
Continue Flonase nasal spray and Claritin 10 mg

## 2018-09-23 NOTE — Progress Notes (Signed)
Date:  09/23/2018   Name:  Carla Green   DOB:  02/24/1961   MRN:  [id number]   Chief Complaint: Anxiety (Could not do 50 mg of sertaline caused cramps and went down to 25 and seems to help. ) and post nasal drip (Wants to try OTC med or Rx has had scratchy throat and drainmage last day or so )  Anxiety  Presents for follow-up visit. Patient reports no decreased concentration, depressed mood, dizziness, nervous/anxious behavior, obsessions, palpitations, shortness of breath or suicidal ideas. Symptoms occur occasionally. The quality of sleep is good.   Compliance with medications is 76-100% (doing much better on zoloft).  Sinus Problem  This is a new problem. The current episode started in the past 7 days. The problem is unchanged. There has been no fever. The pain is mild. Associated symptoms include congestion and sinus pressure. Pertinent negatives include no chills, coughing, headaches, shortness of breath or sore throat. (And thick drainage ) Past treatments include spray decongestants. The treatment provided moderate relief.    Review of Systems  Constitutional: Positive for appetite change and unexpected weight change. Negative for chills, fatigue and fever (has lost 4 lbs since last visit).  HENT: Positive for congestion, postnasal drip and sinus pressure. Negative for sore throat and trouble swallowing.   Respiratory: Negative for cough, chest tightness and shortness of breath.   Cardiovascular: Negative for palpitations.  Gastrointestinal: Negative for abdominal pain, constipation and diarrhea.  Neurological: Negative for dizziness and headaches.  Psychiatric/Behavioral: Negative for decreased concentration, sleep disturbance and suicidal ideas. The patient is not nervous/anxious.     Patient Active Problem List   Diagnosis Date Noted  . Anemia 02/11/2018  . Pain of left hip joint 02/11/2018  . Arthritis of left hip 12/09/2016  . Osteopenia determined by x-ray 12/09/2016    . Mixed hyperlipidemia 10/28/2016  . Neuropathic pain, leg, bilateral 07/14/2016  . Environmental and seasonal allergies 07/14/2016  . Heart palpitations 08/22/2015  . Anxiety 08/15/2014  . Essential hypertension 08/15/2014  . Irritable bowel syndrome with constipation 06/27/2014  . Cannot sleep 06/27/2014  . Neuritis or radiculitis due to rupture of lumbar intervertebral disc 06/27/2014  . Beat, premature ventricular 06/27/2014  . Phlebectasia 06/27/2014    Allergies  Allergen Reactions  . Codeine Sulfate Itching  . Duloxetine Nausea Only    Past Surgical History:  Procedure Laterality Date  . ABDOMINAL HYSTERECTOMY    . COLONOSCOPY    . COLONOSCOPY WITH PROPOFOL N/A 01/11/2016   Procedure: COLONOSCOPY WITH PROPOFOL;  Surgeon: Hulen Luster, MD;  Location: [facility] ENDOSCOPY;  Service: Gastroenterology;  Laterality: N/A;  . ESOPHAGOGASTRODUODENOSCOPY  2013   normal  . ESOPHAGOGASTRODUODENOSCOPY (EGD) WITH PROPOFOL N/A 01/11/2016   Procedure: ESOPHAGOGASTRODUODENOSCOPY (EGD) WITH PROPOFOL;  Surgeon: Hulen Luster, MD;  Location: [facility] ENDOSCOPY;  Service: Gastroenterology;  Laterality: N/A;  . eye brow surgery    . HERNIA REPAIR     femoral  . HYSTEROSCOPY W/D&C N/A 05/07/2015   Procedure: DILATATION AND CURETTAGE /HYSTEROSCOPY;  Surgeon: Benjaman Kindler, MD;  Location: [facility] ORS;  Service: Gynecology;  Laterality: N/A;  . LAPAROSCOPIC TOTAL HYSTERECTOMY    . leg vein    . TONSILLECTOMY    . VARICOSE VEIN SURGERY     laser    Social History   Tobacco Use  . Smoking status: Never Smoker  . Smokeless tobacco: Never Used  Substance Use Topics  . Alcohol use: No    Alcohol/week: 0.0 standard drinks  .  Drug use: No     Medication list has been reviewed and updated.  Current Meds  Medication Sig  . ALPRAZolam (XANAX) 0.5 MG tablet TAKE 1 TABLET BY MOUTH ONCE DAILY AT BEDTIME  . estradiol (VIVELLE-DOT) 0.05 MG/24HR patch Place 1 patch (0.05 mg total) onto the skin once a week.  .  fluticasone (FLONASE) 50 MCG/ACT nasal spray Place 2 sprays into both nostrils daily.  Marland Kitchen ibuprofen (ADVIL,MOTRIN) 800 MG tablet Take 1 tablet (800 mg total) by mouth every 8 (eight) hours as needed for moderate pain. (Patient taking differently: Take 800 mg by mouth every 8 (eight) hours as needed for moderate pain. Takes 0.5 tabs as needed for pain)  . metoprolol succinate (TOPROL-XL) 50 MG 24 hr tablet Take 1 tablet (50 mg total) by mouth daily.  . Omega-3 Fatty Acids (FISH OIL) 1000 MG CAPS Take by mouth.  . sertraline (ZOLOFT) 50 MG tablet Take 1 tablet (50 mg total) by mouth daily. (Patient taking differently: Take 25 mg by mouth daily. )    PHQ 2/9 Scores 09/23/2018 08/09/2018 12/09/2016 03/25/2016  PHQ - 2 Score 0 0 0 0  PHQ- 9 Score 0 - - -   GAD 7 : Generalized Anxiety Score 09/23/2018 08/09/2018  Nervous, Anxious, on Edge 0 3  Control/stop worrying 0 3  Worry too much - different things 0 3  Trouble relaxing 0 3  Restless 0 3  Easily annoyed or irritable 0 3  Afraid - awful might happen 0 3  Total GAD 7 Score 0 21  Anxiety Difficulty - Extremely difficult     Physical Exam  Constitutional: She is oriented to person, place, and time. She appears well-developed. No distress.  HENT:  Head: Normocephalic and atraumatic.  Neck: Normal range of motion. Neck supple.  Cardiovascular: Normal rate, regular rhythm and normal heart sounds.  Pulmonary/Chest: Effort normal and breath sounds normal. No respiratory distress.  Abdominal: Soft.  Musculoskeletal: Normal range of motion.  Neurological: She is alert and oriented to person, place, and time.  Skin: Skin is warm and dry. No rash noted.  Psychiatric: She has a normal mood and affect. Her speech is normal and behavior is normal. Thought content normal.  Nursing note and vitals reviewed.   BP 117/78   Pulse 90   Resp 16   Ht 5\' 2"  (1.575 m)   Wt 119 lb 9.6 oz (54.3 kg)   LMP 04/05/2014   SpO2 99%   BMI 21.88 kg/m    Assessment and Plan: 1. Generalized anxiety disorder Much improved with Zoloft - continue 25 mg Monitor weight loss - try to eat healthy, high protein foods  2. Sinus congestion Continue flonase, add claritin and increase fluid intake Take antibiotics if s/s of infection - doxycycline (VIBRA-TABS) 100 MG tablet; Take 1 tablet (100 mg total) by mouth 2 (two) times daily for 10 days.  Dispense: 20 tablet; Refill: 0   Partially dictated using Editor, commissioning. Any errors are unintentional.  Halina Maidens, MD [facility]  09/23/2018

## 2018-10-26 ENCOUNTER — Ambulatory Visit: Payer: 59 | Admitting: Internal Medicine

## 2018-10-26 ENCOUNTER — Encounter: Payer: Self-pay | Admitting: Internal Medicine

## 2018-10-26 VITALS — BP 126/60 | HR 76 | Temp 98.0°F | Ht 62.0 in | Wt 123.0 lb

## 2018-10-26 DIAGNOSIS — H1033 Unspecified acute conjunctivitis, bilateral: Secondary | ICD-10-CM | POA: Diagnosis not present

## 2018-10-26 MED ORDER — CIPROFLOXACIN HCL 0.3 % OP SOLN: 2.0000 [drp] | OPHTHALMIC | 0 refills | Status: DC

## 2018-10-26 NOTE — Progress Notes (Signed)
Date:  10/26/2018   Name:  Carla Green   DOB:  Jul 03, 1961   MRN:  [id number]   Chief Complaint: Conjunctivitis (Patient on Sat had pink eye. Started with left but now right is irritated. Itching and crust around eyes. )  Conjunctivitis   The current episode started today. The onset was sudden. The problem has been unchanged. The problem is mild. Associated symptoms include eye itching and eye redness. Pertinent negatives include no cough.    Review of Systems  Constitutional: Negative for chills and fatigue.  Eyes: Positive for redness and itching.  Respiratory: Negative for cough.   Cardiovascular: Negative for chest pain and palpitations.  Allergic/Immunologic: Negative for environmental allergies.    Patient Active Problem List   Diagnosis Date Noted  . Generalized anxiety disorder 09/23/2018  . Anemia 02/11/2018  . Pain of left hip joint 02/11/2018  . Arthritis of left hip 12/09/2016  . Osteopenia determined by x-ray 12/09/2016  . Mixed hyperlipidemia 10/28/2016  . Neuropathic pain, leg, bilateral 07/14/2016  . Environmental and seasonal allergies 07/14/2016  . Heart palpitations 08/22/2015  . Essential hypertension 08/15/2014  . Irritable bowel syndrome with constipation 06/27/2014  . Cannot sleep 06/27/2014  . Neuritis or radiculitis due to rupture of lumbar intervertebral disc 06/27/2014  . Beat, premature ventricular 06/27/2014  . Phlebectasia 06/27/2014    Allergies  Allergen Reactions  . Codeine Sulfate Itching  . Duloxetine Nausea Only    Past Surgical History:  Procedure Laterality Date  . ABDOMINAL HYSTERECTOMY    . COLONOSCOPY    . COLONOSCOPY WITH PROPOFOL N/A 01/11/2016   Procedure: COLONOSCOPY WITH PROPOFOL;  Surgeon: Hulen Luster, MD;  Location: [facility] ENDOSCOPY;  Service: Gastroenterology;  Laterality: N/A;  . ESOPHAGOGASTRODUODENOSCOPY  2013   normal  . ESOPHAGOGASTRODUODENOSCOPY (EGD) WITH PROPOFOL N/A 01/11/2016   Procedure:  ESOPHAGOGASTRODUODENOSCOPY (EGD) WITH PROPOFOL;  Surgeon: Hulen Luster, MD;  Location: [facility] ENDOSCOPY;  Service: Gastroenterology;  Laterality: N/A;  . eye brow surgery    . HERNIA REPAIR     femoral  . HYSTEROSCOPY W/D&C N/A 05/07/2015   Procedure: DILATATION AND CURETTAGE /HYSTEROSCOPY;  Surgeon: Benjaman Kindler, MD;  Location: [facility] ORS;  Service: Gynecology;  Laterality: N/A;  . LAPAROSCOPIC TOTAL HYSTERECTOMY    . leg vein    . TONSILLECTOMY    . VARICOSE VEIN SURGERY     laser    Social History   Tobacco Use  . Smoking status: Never Smoker  . Smokeless tobacco: Never Used  Substance Use Topics  . Alcohol use: No    Alcohol/week: 0.0 standard drinks  . Drug use: No     Medication list has been reviewed and updated.  Current Meds  Medication Sig  . ALPRAZolam (XANAX) 0.5 MG tablet TAKE 1 TABLET BY MOUTH ONCE DAILY AT BEDTIME  . estradiol (VIVELLE-DOT) 0.05 MG/24HR patch Place 1 patch (0.05 mg total) onto the skin once a week.  . fluticasone (FLONASE) 50 MCG/ACT nasal spray Place 2 sprays into both nostrils daily.  Marland Kitchen ibuprofen (ADVIL,MOTRIN) 800 MG tablet Take 1 tablet (800 mg total) by mouth every 8 (eight) hours as needed for moderate pain. (Patient taking differently: Take 800 mg by mouth every 8 (eight) hours as needed for moderate pain. Takes 0.5 tabs as needed for pain)  . metoprolol succinate (TOPROL-XL) 50 MG 24 hr tablet Take 1 tablet (50 mg total) by mouth daily.  . Omega-3 Fatty Acids (FISH OIL) 1000 MG CAPS Take by mouth.  Marland Kitchen  sertraline (ZOLOFT) 50 MG tablet Take 1 tablet (50 mg total) by mouth daily. (Patient taking differently: Take 25 mg by mouth daily. )    PHQ 2/9 Scores 09/23/2018 08/09/2018 12/09/2016 03/25/2016  PHQ - 2 Score 0 0 0 0  PHQ- 9 Score 0 - - -    Physical Exam  Constitutional: She is oriented to person, place, and time. She appears well-developed. No distress.  HENT:  Head: Normocephalic and atraumatic.  Eyes: EOM are normal. Right conjunctiva is  injected. Left conjunctiva is injected.  Pulmonary/Chest: Effort normal. No respiratory distress.  Musculoskeletal: Normal range of motion.  Neurological: She is alert and oriented to person, place, and time.  Skin: Skin is warm and dry. No rash noted.  Psychiatric: She has a normal mood and affect. Her behavior is normal. Thought content normal.  Nursing note and vitals reviewed.   BP 126/60 (BP Location: Right Arm, Patient Position: Sitting, Cuff Size: Normal)   Pulse 76   Temp 98 F (36.7 C) (Oral)   Ht 5\' 2"  (1.575 m)   Wt 123 lb (55.8 kg)   LMP 04/05/2014   SpO2 99%   BMI 22.50 kg/m   Assessment and Plan: 1. Acute bacterial conjunctivitis of both eyes Avoid eye makeup and practice good hand hygiene - ciprofloxacin (CILOXAN) 0.3 % ophthalmic solution; Place 2 drops into both eyes every 4 (four) hours while awake.  Dispense: 5 mL; Refill: 0   Partially dictated using Editor, commissioning. Any errors are unintentional.  Halina Maidens, MD [facility]  10/26/2018

## 2018-11-19 ENCOUNTER — Ambulatory Visit: Payer: 59 | Admitting: Internal Medicine

## 2018-11-19 ENCOUNTER — Encounter: Payer: Self-pay | Admitting: Internal Medicine

## 2018-11-19 VITALS — BP 98/68 | HR 72 | Resp 16 | Ht 62.0 in | Wt 123.0 lb

## 2018-11-19 DIAGNOSIS — F419 Anxiety disorder, unspecified: Secondary | ICD-10-CM | POA: Diagnosis not present

## 2018-11-19 DIAGNOSIS — M8580 Other specified disorders of bone density and structure, unspecified site: Secondary | ICD-10-CM | POA: Diagnosis not present

## 2018-11-19 DIAGNOSIS — Z1231 Encounter for screening mammogram for malignant neoplasm of breast: Secondary | ICD-10-CM

## 2018-11-19 MED ORDER — SERTRALINE HCL 50 MG PO TABS: 50.0000 mg | ORAL_TABLET | Freq: Every day | ORAL | 1 refills | Status: DC

## 2018-11-19 NOTE — Progress Notes (Signed)
Date:  11/19/2018   Name:  Carla Green   DOB:  Dec 20, 1960   MRN:  [id number]   Chief Complaint: Rash (R Breast - Noticed Monday- Redness and itching as well as heat and burning. )  Rash  This is a new problem. The current episode started in the past 7 days. The problem is unchanged. The affected locations include the torso. The rash is characterized by redness and itchiness. Pertinent negatives include no fatigue, fever or shortness of breath.  She does not have any breast mass but her breasts do itch intermittently. She developed an eczematous rash on her right breast, outer lower region that she has been treating with a steroid cream prescribed by her Dermatologist.  The rash has faded significantly.  Review of Systems  Constitutional: Negative for chills, fatigue and fever.  Respiratory: Negative for chest tightness and shortness of breath.   Cardiovascular: Negative for chest pain and palpitations.  Skin: Positive for rash.  Psychiatric/Behavioral: Negative for dysphoric mood and sleep disturbance.    Patient Active Problem List   Diagnosis Date Noted  . Generalized anxiety disorder 09/23/2018  . Anemia 02/11/2018  . Pain of left hip joint 02/11/2018  . Arthritis of left hip 12/09/2016  . Osteopenia determined by x-ray 12/09/2016  . Mixed hyperlipidemia 10/28/2016  . Neuropathic pain, leg, bilateral 07/14/2016  . Environmental and seasonal allergies 07/14/2016  . Heart palpitations 08/22/2015  . Essential hypertension 08/15/2014  . Irritable bowel syndrome with constipation 06/27/2014  . Cannot sleep 06/27/2014  . Neuritis or radiculitis due to rupture of lumbar intervertebral disc 06/27/2014  . Beat, premature ventricular 06/27/2014  . Phlebectasia 06/27/2014    Allergies  Allergen Reactions  . Codeine Sulfate Itching  . Duloxetine Nausea Only    Past Surgical History:  Procedure Laterality Date  . ABDOMINAL HYSTERECTOMY    . COLONOSCOPY    . COLONOSCOPY WITH  PROPOFOL N/A 01/11/2016   Procedure: COLONOSCOPY WITH PROPOFOL;  Surgeon: Hulen Luster, MD;  Location: [facility] ENDOSCOPY;  Service: Gastroenterology;  Laterality: N/A;  . ESOPHAGOGASTRODUODENOSCOPY  2013   normal  . ESOPHAGOGASTRODUODENOSCOPY (EGD) WITH PROPOFOL N/A 01/11/2016   Procedure: ESOPHAGOGASTRODUODENOSCOPY (EGD) WITH PROPOFOL;  Surgeon: Hulen Luster, MD;  Location: [facility] ENDOSCOPY;  Service: Gastroenterology;  Laterality: N/A;  . eye brow surgery    . HERNIA REPAIR     femoral  . HYSTEROSCOPY W/D&C N/A 05/07/2015   Procedure: DILATATION AND CURETTAGE /HYSTEROSCOPY;  Surgeon: Benjaman Kindler, MD;  Location: [facility] ORS;  Service: Gynecology;  Laterality: N/A;  . LAPAROSCOPIC TOTAL HYSTERECTOMY    . leg vein    . TONSILLECTOMY    . VARICOSE VEIN SURGERY     laser    Social History   Tobacco Use  . Smoking status: Never Smoker  . Smokeless tobacco: Never Used  Substance Use Topics  . Alcohol use: No    Alcohol/week: 0.0 standard drinks  . Drug use: No     Medication list has been reviewed and updated.  Current Meds  Medication Sig  . ALPRAZolam (XANAX) 0.5 MG tablet TAKE 1 TABLET BY MOUTH ONCE DAILY AT BEDTIME  . ciprofloxacin (CILOXAN) 0.3 % ophthalmic solution Place 2 drops into both eyes every 4 (four) hours while awake.  . estradiol (VIVELLE-DOT) 0.05 MG/24HR patch Place 1 patch (0.05 mg total) onto the skin once a week.  . fluticasone (FLONASE) 50 MCG/ACT nasal spray Place 2 sprays into both nostrils daily.  Marland Kitchen ibuprofen (ADVIL,MOTRIN) 800 MG  tablet Take 1 tablet (800 mg total) by mouth every 8 (eight) hours as needed for moderate pain. (Patient taking differently: Take 800 mg by mouth every 8 (eight) hours as needed for moderate pain. Takes 0.5 tabs as needed for pain)  . metoprolol succinate (TOPROL-XL) 50 MG 24 hr tablet Take 1 tablet (50 mg total) by mouth daily.  . Omega-3 Fatty Acids (FISH OIL) 1000 MG CAPS Take by mouth.  . sertraline (ZOLOFT) 50 MG tablet Take 1 tablet (50  mg total) by mouth daily. (Patient taking differently: Take 25 mg by mouth daily. )    PHQ 2/9 Scores 09/23/2018 08/09/2018 12/09/2016 03/25/2016  PHQ - 2 Score 0 0 0 0  PHQ- 9 Score 0 - - -    Physical Exam Vitals signs and nursing note reviewed.  Constitutional:      General: She is not in acute distress.    Appearance: She is well-developed.  HENT:     Head: Normocephalic and atraumatic.  Neck:     Musculoskeletal: Normal range of motion and neck supple.  Pulmonary:     Effort: Pulmonary effort is normal. No respiratory distress.  Chest:     Breasts: Breasts are symmetrical.        Right: Normal. No inverted nipple, mass, nipple discharge or tenderness.        Left: Normal. No inverted nipple, mass, nipple discharge or tenderness.    Musculoskeletal: Normal range of motion.  Lymphadenopathy:     Cervical: No cervical adenopathy.     Upper Body:     Right upper body: No supraclavicular, axillary or pectoral adenopathy.     Left upper body: No supraclavicular, axillary or pectoral adenopathy.  Skin:    General: Skin is warm and dry.     Findings: No rash.  Neurological:     Mental Status: She is alert and oriented to person, place, and time.  Psychiatric:        Behavior: Behavior normal.        Thought Content: Thought content normal.     BP 98/68   Pulse 72   Resp 16   Ht 5\' 2"  (1.575 m)   Wt 123 lb (55.8 kg)   LMP 04/05/2014   SpO2 98%   BMI 22.50 kg/m   Assessment and Plan: 1. Encounter for screening mammogram for breast cancer No evidence of breast cancer Continue topical steroid for eczematous rash - MM 3D SCREEN BREAST BILATERAL; Future  2. Osteopenia determined by x-ray repeat - DG Bone Density; Future  3. Anxiety Continue sertratine - sertraline (ZOLOFT) 50 MG tablet; Take 1 tablet (50 mg total) by mouth daily.  Dispense: 90 tablet; Refill: 1   Partially dictated using Editor, commissioning. Any errors are unintentional.  Halina Maidens, MD [facility]  11/19/2018

## 2018-11-26 DIAGNOSIS — I788 Other diseases of capillaries: Secondary | ICD-10-CM | POA: Diagnosis not present

## 2018-11-26 DIAGNOSIS — B078 Other viral warts: Secondary | ICD-10-CM | POA: Diagnosis not present

## 2018-12-13 ENCOUNTER — Ambulatory Visit: Payer: 59 | Admitting: Internal Medicine

## 2018-12-13 ENCOUNTER — Encounter: Payer: Self-pay | Admitting: Internal Medicine

## 2018-12-13 VITALS — BP 134/80 | HR 104 | Temp 98.2°F | Ht 62.0 in | Wt 123.0 lb

## 2018-12-13 DIAGNOSIS — H6690 Otitis media, unspecified, unspecified ear: Secondary | ICD-10-CM

## 2018-12-13 MED ORDER — AMOXICILLIN-POT CLAVULANATE 875-125 MG PO TABS: 1.0000 | ORAL_TABLET | Freq: Two times a day (BID) | ORAL | 0 refills | Status: AC

## 2018-12-13 NOTE — Progress Notes (Signed)
Date:  12/13/2018   Name:  Carla Green   DOB:  Jun 30, 1961   MRN:  [id number]   Chief Complaint: Sinusitis (Fatigue, L) ear pain, and sinus pressure Since Thursday. Lastnight felt like may have fever. )  Sinusitis  This is a new problem. The current episode started in the past 7 days. The problem is unchanged. There has been no fever. The pain is mild. Associated symptoms include chills, congestion, ear pain, sinus pressure and a sore throat. Pertinent negatives include no coughing, headaches or shortness of breath. Past treatments include spray decongestants. The treatment provided mild relief.    Review of Systems  Constitutional: Positive for chills.  HENT: Positive for congestion, ear pain, sinus pressure and sore throat. Negative for trouble swallowing.   Respiratory: Negative for cough, chest tightness, shortness of breath and wheezing.   Cardiovascular: Negative for chest pain, palpitations and leg swelling.  Neurological: Negative for dizziness, light-headedness and headaches.    Patient Active Problem List   Diagnosis Date Noted  . Generalized anxiety disorder 09/23/2018  . Anemia 02/11/2018  . Pain of left hip joint 02/11/2018  . Arthritis of left hip 12/09/2016  . Osteopenia determined by x-ray 12/09/2016  . Mixed hyperlipidemia 10/28/2016  . Neuropathic pain, leg, bilateral 07/14/2016  . Environmental and seasonal allergies 07/14/2016  . Heart palpitations 08/22/2015  . Essential hypertension 08/15/2014  . Irritable bowel syndrome with constipation 06/27/2014  . Cannot sleep 06/27/2014  . Neuritis or radiculitis due to rupture of lumbar intervertebral disc 06/27/2014  . Beat, premature ventricular 06/27/2014  . Phlebectasia 06/27/2014    Allergies  Allergen Reactions  . Codeine Sulfate Itching  . Duloxetine Nausea Only    Past Surgical History:  Procedure Laterality Date  . ABDOMINAL HYSTERECTOMY    . COLONOSCOPY    . COLONOSCOPY WITH PROPOFOL N/A  01/11/2016   Procedure: COLONOSCOPY WITH PROPOFOL;  Surgeon: Hulen Luster, MD;  Location: [facility] ENDOSCOPY;  Service: Gastroenterology;  Laterality: N/A;  . ESOPHAGOGASTRODUODENOSCOPY  2013   normal  . ESOPHAGOGASTRODUODENOSCOPY (EGD) WITH PROPOFOL N/A 01/11/2016   Procedure: ESOPHAGOGASTRODUODENOSCOPY (EGD) WITH PROPOFOL;  Surgeon: Hulen Luster, MD;  Location: [facility] ENDOSCOPY;  Service: Gastroenterology;  Laterality: N/A;  . eye brow surgery    . HERNIA REPAIR     femoral  . HYSTEROSCOPY W/D&C N/A 05/07/2015   Procedure: DILATATION AND CURETTAGE /HYSTEROSCOPY;  Surgeon: Benjaman Kindler, MD;  Location: [facility] ORS;  Service: Gynecology;  Laterality: N/A;  . LAPAROSCOPIC TOTAL HYSTERECTOMY    . leg vein    . TONSILLECTOMY    . VARICOSE VEIN SURGERY     laser    Social History   Tobacco Use  . Smoking status: Never Smoker  . Smokeless tobacco: Never Used  Substance Use Topics  . Alcohol use: No    Alcohol/week: 0.0 standard drinks  . Drug use: No     Medication list has been reviewed and updated.  Current Meds  Medication Sig  . ALPRAZolam (XANAX) 0.5 MG tablet TAKE 1 TABLET BY MOUTH ONCE DAILY AT BEDTIME  . estradiol (VIVELLE-DOT) 0.05 MG/24HR patch Place 1 patch (0.05 mg total) onto the skin once a week.  . fluticasone (FLONASE) 50 MCG/ACT nasal spray Place 2 sprays into both nostrils daily.  Marland Kitchen ibuprofen (ADVIL,MOTRIN) 800 MG tablet Take 1 tablet (800 mg total) by mouth every 8 (eight) hours as needed for moderate pain. (Patient taking differently: Take 800 mg by mouth every 8 (eight) hours as needed  for moderate pain. Takes 0.5 tabs as needed for pain)  . metoprolol succinate (TOPROL-XL) 50 MG 24 hr tablet Take 1 tablet (50 mg total) by mouth daily.  . Omega-3 Fatty Acids (FISH OIL) 1000 MG CAPS Take by mouth.  . sertraline (ZOLOFT) 50 MG tablet Take 1 tablet (50 mg total) by mouth daily. (Patient taking differently: Take 25 mg by mouth daily. )    PHQ 2/9 Scores 12/13/2018 09/23/2018  08/09/2018 12/09/2016  PHQ - 2 Score 0 0 0 0  PHQ- 9 Score - 0 - -    Physical Exam Vitals signs and nursing note reviewed.  Constitutional:      General: She is not in acute distress.    Appearance: She is well-developed.  HENT:     Head: Normocephalic and atraumatic.     Right Ear: Ear canal and external ear normal. Tympanic membrane is not erythematous or retracted.     Left Ear: Ear canal and external ear normal. Tympanic membrane is not erythematous or retracted.     Nose:     Right Sinus: Maxillary sinus tenderness and frontal sinus tenderness present.     Left Sinus: Maxillary sinus tenderness and frontal sinus tenderness present.     Mouth/Throat:     Mouth: No oral lesions.     Pharynx: Uvula midline. Posterior oropharyngeal erythema present. No oropharyngeal exudate.  Neck:     Musculoskeletal: Normal range of motion.  Cardiovascular:     Rate and Rhythm: Normal rate and regular rhythm.     Heart sounds: Normal heart sounds.  Pulmonary:     Effort: Pulmonary effort is normal. No respiratory distress.     Breath sounds: Normal breath sounds. No wheezing or rales.  Musculoskeletal: Normal range of motion.  Lymphadenopathy:     Cervical: No cervical adenopathy.  Skin:    General: Skin is warm and dry.     Findings: No rash.  Neurological:     Mental Status: She is alert and oriented to person, place, and time.  Psychiatric:        Behavior: Behavior normal.        Thought Content: Thought content normal.     BP 134/80   Pulse (!) 104   Temp 98.2 F (36.8 C) (Oral)   Ht 5\' 2"  (1.575 m)   Wt 123 lb (55.8 kg)   LMP 04/05/2014   SpO2 97%   BMI 22.50 kg/m   Assessment and Plan: 1. Acute otitis media, unspecified otitis media type Continue Flonase spray - amoxicillin-clavulanate (AUGMENTIN) 875-125 MG tablet; Take 1 tablet by mouth 2 (two) times daily for 10 days.  Dispense: 20 tablet; Refill: 0   Partially dictated using Editor, commissioning. Any errors are  unintentional.  Halina Maidens, MD [facility]  12/13/2018

## 2019-01-04 DIAGNOSIS — G8929 Other chronic pain: Secondary | ICD-10-CM | POA: Diagnosis not present

## 2019-01-04 DIAGNOSIS — R1032 Left lower quadrant pain: Secondary | ICD-10-CM | POA: Diagnosis not present

## 2019-01-04 DIAGNOSIS — R102 Pelvic and perineal pain: Secondary | ICD-10-CM | POA: Diagnosis not present

## 2019-01-06 DIAGNOSIS — H9120 Sudden idiopathic hearing loss, unspecified ear: Secondary | ICD-10-CM | POA: Diagnosis not present

## 2019-01-06 DIAGNOSIS — H6982 Other specified disorders of Eustachian tube, left ear: Secondary | ICD-10-CM | POA: Diagnosis not present

## 2019-01-07 DIAGNOSIS — M25552 Pain in left hip: Secondary | ICD-10-CM | POA: Diagnosis not present

## 2019-01-07 DIAGNOSIS — M1612 Unilateral primary osteoarthritis, left hip: Secondary | ICD-10-CM | POA: Diagnosis not present

## 2019-01-13 ENCOUNTER — Ambulatory Visit
Admission: RE | Admit: 2019-01-13 | Discharge: 2019-01-13 | Disposition: A | Discharge status: Home / Self Care | Payer: 59 | Source: Ambulatory Visit | Attending: Internal Medicine | Admitting: Internal Medicine

## 2019-01-13 DIAGNOSIS — Z1231 Encounter for screening mammogram for malignant neoplasm of breast: Secondary | ICD-10-CM | POA: Diagnosis not present

## 2019-01-13 DIAGNOSIS — M8580 Other specified disorders of bone density and structure, unspecified site: Secondary | ICD-10-CM | POA: Insufficient documentation

## 2019-01-13 DIAGNOSIS — M8588 Other specified disorders of bone density and structure, other site: Secondary | ICD-10-CM | POA: Diagnosis not present

## 2019-01-14 NOTE — Progress Notes (Signed)
Called and spoke with pt. Informed of both Bone Density results and Negative Mammo.

## 2019-01-17 NOTE — Progress Notes (Signed)
Cardiology Office Note  Date:  01/19/2019   ID:  Carla Green, DOB Nov 23, 1960, MRN [id number]  PCP:  Carla Hess, MD   Chief Complaint  Patient presents with  . other    1 year F/U. Medications reviewed verbally with patient.    HPI:  Carla Green is a very pleasant 58 year old woman with a history of Palpitations Anxiety issues s/p hysterectomy. problems with constipation  hormone therapy, on a patch Off zoloft Recent CT coronary calcium score of 0 Presents for routine follow-up of palpitations, chest pain  Goes to the gym, treadmill, walks and runs, > 1 mild  weights Reports that she can get her heart rate up to 160 bpm on the treadmill with no symptoms of shortness of breath or chest pain  She continues to have atypical chest pain, when laying down Does not know if it is stress or other etiology Denies GERD symptoms  Blood pressure at home running high per her report typically 798 systolic Used to run low Blood pressure measurement in the computer from January 9211 systolic pressure was 98  Significant stressors at home Lost mom 5 years ago Lost father two year ago, Still having adjustment disorder Also reports that she has a son with drug abuse issues  In the past, problems with insomnia, takes xanax,melatonin, tylenol,. Still cant sleep  Previously on a statin, she is not taking this anymore  Husband moved out previously She has a new boyfriend  EKG personally reviewed by myself on todays visit Shows normal sinus rhythm rate 67 bpm no significant ST or T wave changes  Other past medical history Previous problems with Insomnia. Palpitations more at nighttime. It was listening with a stethoscope Previous Lab work from primary care reviewed hemoglobin A1c 5.2, creatinine 0.73, hematocrit 39, TSH 1.0, total cholesterol 245, LDL 142 EKG from primary care August 2015 showing no ectopy otherwise normal EKG   long history of burning in her legs. Her symptoms seem  to flareup more when she walks. She reports after a busy day at work, she is more symptomatic. She has tried Neurontin with no benefit.  She does have significant stressors. Mother passed away in December 18, 2013. She helps to take care of her elderly father. Also has in-laws that she helps to take care of. She has some marital issues, also other stress at home, possibly children. This has caused significant anxiety for her She has started working night shifts   Previous stress echo at [facility] March 2011 which was normal. She exercised for 9 minutes, peak heart rate 169 beats per minute, normal echo at rest and stress   PMH:   has a past medical history of Anxiety, Chickenpox, Complication of anesthesia, Depression, Dysrhythmia, History of kidney stones, IBS (irritable bowel syndrome), Insomnia, Menopausal state, Mixed hyperlipidemia, Neuropathy, Palpitations, PONV (postoperative nausea and vomiting), PVC (premature ventricular contraction), Uterine hyperplasia, and Varicose veins.  PSH:    Past Surgical History:  Procedure Laterality Date  . ABDOMINAL HYSTERECTOMY    . COLONOSCOPY    . COLONOSCOPY WITH PROPOFOL N/A 01/11/2016   Procedure: COLONOSCOPY WITH PROPOFOL;  Surgeon: Carla Luster, MD;  Location: [facility] ENDOSCOPY;  Service: Gastroenterology;  Laterality: N/A;  . ESOPHAGOGASTRODUODENOSCOPY  2013   normal  . ESOPHAGOGASTRODUODENOSCOPY (EGD) WITH PROPOFOL N/A 01/11/2016   Procedure: ESOPHAGOGASTRODUODENOSCOPY (EGD) WITH PROPOFOL;  Surgeon: Carla Luster, MD;  Location: [facility] ENDOSCOPY;  Service: Gastroenterology;  Laterality: N/A;  . eye brow surgery    . HERNIA  REPAIR     femoral  . HYSTEROSCOPY W/D&C N/A 05/07/2015   Procedure: DILATATION AND CURETTAGE /HYSTEROSCOPY;  Surgeon: Carla Kindler, MD;  Location: [facility] ORS;  Service: Gynecology;  Laterality: N/A;  . LAPAROSCOPIC TOTAL HYSTERECTOMY    . leg vein    . TONSILLECTOMY    . VARICOSE VEIN SURGERY     laser    Current Outpatient  Medications  Medication Sig Dispense Refill  . ALPRAZolam (XANAX) 0.5 MG tablet TAKE 1 TABLET BY MOUTH ONCE DAILY AT BEDTIME 90 tablet 1  . estradiol (VIVELLE-DOT) 0.05 MG/24HR patch Place 1 patch (0.05 mg total) onto the skin once a week. 8 patch 5  . fluticasone (FLONASE) 50 MCG/ACT nasal spray Place 2 sprays into both nostrils daily as needed for allergies or rhinitis.    Marland Kitchen ibuprofen (ADVIL,MOTRIN) 200 MG tablet Take 600 mg by mouth every 6 (six) hours as needed.    . metoprolol succinate (TOPROL-XL) 50 MG 24 hr tablet Take 50 mg by mouth daily as needed. Take with or immediately following a meal.    . Omega-3 Fatty Acids (FISH OIL) 1000 MG CAPS Take by mouth daily.     . sertraline (ZOLOFT) 25 MG tablet Take 25 mg by mouth daily.     No current facility-administered medications for this visit.      Allergies:   Codeine sulfate and Duloxetine   Social History:  The patient  reports that she has never smoked. She has never used smokeless tobacco. She reports that she does not drink alcohol or use drugs.   Family History:   family history includes Colon cancer in her father; Heart attack in her father; Heart attack (age of onset: 12) in her paternal grandmother; Heart attack (age of onset: 62) in her paternal uncle; Heart disease in her father and paternal uncle.    Review of Systems: Review of Systems  Constitutional: Negative.   Respiratory: Negative.   Cardiovascular: Positive for chest pain.  Gastrointestinal: Negative.   Musculoskeletal: Negative.   Neurological: Negative.   Psychiatric/Behavioral: The patient is nervous/anxious.   All other systems reviewed and are negative.    PHYSICAL EXAM: VS:  BP (!) 142/62 (BP Location: Left Arm, Patient Position: Sitting, Cuff Size: Normal)   Pulse 94   Ht 5\' 2"  (1.575 m)   Wt 123 lb (55.8 kg)   LMP 04/05/2014   SpO2 98%   BMI 22.50 kg/m  , BMI Body mass index is 22.5 kg/m. Constitutional:  oriented to person, place, and time.  No distress.  HENT:  Head: Grossly normal Eyes:  no discharge. No scleral icterus.  Neck: No JVD, no carotid bruits  Cardiovascular: Regular rate and rhythm, no murmurs appreciated Pulmonary/Chest: Clear to auscultation bilaterally, no wheezes or rails Abdominal: Soft.  no distension.  no tenderness.  Musculoskeletal: Normal range of motion Neurological:  normal muscle tone. Coordination normal. No atrophy Skin: Skin warm and dry Psychiatric: normal affect, pleasant  Recent Labs: 02/11/2018: ALT 25; BUN 15; Creatinine, Ser 0.70; Hemoglobin 12.5; Platelets 185; Potassium 4.1; Sodium 143; TSH 0.960    Lipid Panel Lab Results  Component Value Date   CHOL 256 (H) 02/11/2018   HDL 64 02/11/2018   LDLCALC 174 (H) 02/11/2018   TRIG 91 02/11/2018      Wt Readings from Last 3 Encounters:  01/19/19 123 lb (55.8 kg)  12/13/18 123 lb (55.8 kg)  11/19/18 123 lb (55.8 kg)       ASSESSMENT AND PLAN:  Essential hypertension - Plan: EKG 12-Lead She reports blood pressure running high, Recommend she continue to monitor at home and if this continues to run high could potentially take metoprolol 25 daily She prefers to take this as needed  Beat, premature ventricular - Plan: EKG 12-Lead Metoprolol as needed Likely exacerbated by anxiety and stress  Anxiety Previously on Zoloft Previously with insomnia Symptoms somewhat improved by doing regular exercise  Insomnia, unspecified type Previously taking Xanax, melatonin, other over-the-counter medications for sleep. Loss of both parents in the past several years  Hyperlipidemia Cholesterol is elevated, CT coronary calcium score 0, also reviewed abdominal CT from 2018 showing no aortic atherosclerosis extending through the iliacs   Total encounter time more than 25 minutes  Greater than 50% was spent in counseling and coordination of care with the patient   Disposition:   F/U as needed   Orders Placed This Encounter   Procedures  . EKG 12-Lead     Signed, Esmond Plants, M.D., Ph.D. 01/19/2019  [place], [place]

## 2019-01-19 ENCOUNTER — Encounter: Payer: Self-pay | Admitting: Cardiovascular Disease

## 2019-01-19 ENCOUNTER — Ambulatory Visit (INDEPENDENT_AMBULATORY_CARE_PROVIDER_SITE_OTHER): Payer: 59 | Admitting: Cardiovascular Disease

## 2019-01-19 VITALS — BP 142/62 | HR 94 | Ht 62.0 in | Wt 123.0 lb

## 2019-01-19 DIAGNOSIS — I10 Essential (primary) hypertension: Secondary | ICD-10-CM | POA: Diagnosis not present

## 2019-01-19 DIAGNOSIS — G4700 Insomnia, unspecified: Secondary | ICD-10-CM | POA: Diagnosis not present

## 2019-01-19 DIAGNOSIS — F419 Anxiety disorder, unspecified: Secondary | ICD-10-CM | POA: Diagnosis not present

## 2019-01-19 DIAGNOSIS — E782 Mixed hyperlipidemia: Secondary | ICD-10-CM

## 2019-01-19 DIAGNOSIS — I493 Ventricular premature depolarization: Secondary | ICD-10-CM

## 2019-01-19 MED ORDER — METOPROLOL SUCCINATE ER 25 MG PO TB24: 25.0000 mg | ORAL_TABLET | Freq: Every day | ORAL | 6 refills | Status: DC | PRN

## 2019-01-19 NOTE — Patient Instructions (Addendum)
Medication Instructions:  Take metoprolol 25 mg as needed for high blood pressure or palpitations  If you need a refill on your cardiac medications before your next appointment, please call your pharmacy.    Lab work: No new labs needed   If you have labs (blood work) drawn today and your tests are completely normal, you will receive your results only by: Marland Kitchen MyChart Message (if you have MyChart) OR . A paper copy in the mail If you have any lab test that is abnormal or we need to change your treatment, we will call you to review the results.   Testing/Procedures: No new testing needed   Follow-Up: At [facility], you and your health needs are our priority.  As part of our continuing mission to provide you with exceptional heart care, we have created designated Provider Care Teams.  These Care Teams include your primary Cardiologist (physician) and Advanced Practice Providers (APPs -  Physician Assistants and Nurse Practitioners) who all work together to provide you with the care you need, when you need it.  . You will need a follow up appointment as needed  . Providers on your designated Care Team:   . Murray Hodgkins, NP . Christell Faith, PA-C . Marrianne Mood, PA-C  Any Other Special Instructions Will Be Listed Below (If Applicable).  For educational health videos Log in to : [email or web address] Or : [email or web address], password : [email or web address]

## 2019-01-20 DIAGNOSIS — N941 Unspecified dyspareunia: Secondary | ICD-10-CM | POA: Diagnosis not present

## 2019-01-20 DIAGNOSIS — N9489 Other specified conditions associated with female genital organs and menstrual cycle: Secondary | ICD-10-CM | POA: Diagnosis not present

## 2019-01-20 DIAGNOSIS — G8929 Other chronic pain: Secondary | ICD-10-CM | POA: Diagnosis not present

## 2019-01-20 DIAGNOSIS — R102 Pelvic and perineal pain: Secondary | ICD-10-CM | POA: Diagnosis not present

## 2019-02-08 DIAGNOSIS — L820 Inflamed seborrheic keratosis: Secondary | ICD-10-CM | POA: Diagnosis not present

## 2019-02-08 DIAGNOSIS — L739 Follicular disorder, unspecified: Secondary | ICD-10-CM | POA: Diagnosis not present

## 2019-02-08 DIAGNOSIS — B078 Other viral warts: Secondary | ICD-10-CM | POA: Diagnosis not present

## 2019-02-11 ENCOUNTER — Other Ambulatory Visit: Payer: Self-pay | Admitting: Internal Medicine

## 2019-02-11 ENCOUNTER — Telehealth: Payer: Self-pay

## 2019-02-11 DIAGNOSIS — N951 Menopausal and female climacteric states: Secondary | ICD-10-CM

## 2019-02-11 DIAGNOSIS — F419 Anxiety disorder, unspecified: Secondary | ICD-10-CM

## 2019-02-11 MED ORDER — ALPRAZOLAM 0.5 MG PO TABS: 0.5000 mg | ORAL_TABLET | Freq: Every day | ORAL | 1 refills | Status: DC

## 2019-02-11 MED ORDER — ESTRADIOL 0.05 MG/24HR TD PTTW
1.0000 | MEDICATED_PATCH | TRANSDERMAL | 5 refills | Status: DC
Start: 2019-02-11 — End: 2020-02-14

## 2019-02-11 NOTE — Telephone Encounter (Signed)
Wants refill sent to [facility]. Xanax  Estradiol patch

## 2019-02-15 ENCOUNTER — Encounter: Payer: Self-pay | Admitting: Internal Medicine

## 2019-02-15 ENCOUNTER — Ambulatory Visit: Payer: 59 | Admitting: Internal Medicine

## 2019-02-15 ENCOUNTER — Other Ambulatory Visit: Payer: Self-pay

## 2019-02-15 VITALS — BP 132/84 | HR 94 | Temp 98.0°F | Resp 16 | Ht 62.0 in | Wt 124.0 lb

## 2019-02-15 DIAGNOSIS — H9313 Tinnitus, bilateral: Secondary | ICD-10-CM | POA: Insufficient documentation

## 2019-02-15 DIAGNOSIS — B009 Herpesviral infection, unspecified: Secondary | ICD-10-CM | POA: Diagnosis not present

## 2019-02-15 DIAGNOSIS — H6993 Unspecified Eustachian tube disorder, bilateral: Secondary | ICD-10-CM

## 2019-02-15 MED ORDER — VALACYCLOVIR HCL 1 G PO TABS: 1000.0000 mg | ORAL_TABLET | Freq: Two times a day (BID) | ORAL | 0 refills | Status: AC

## 2019-02-15 NOTE — Progress Notes (Signed)
Date:  02/15/2019   Name:  Carla Green   DOB:  1961-08-23   MRN:  [id number]   Chief Complaint: Ear Pain (L Ear pain x 2 weeks. Ringing in Ears R and L. )  Otalgia   There is pain in the left ear. This is a recurrent problem. The current episode started 1 to 4 weeks ago. Episode frequency: most days. There has been no fever. The pain is mild (and fullness). Associated symptoms include hearing loss (on testing but she does not notice it). Pertinent negatives include no coughing, diarrhea, ear discharge, headaches, rash or sore throat. Treatments tried: steroid taper. The treatment provided no relief.  Tinnitus - started 4 weeks ago.  Seen by ENT - has mild hearing loss on the left and was given prednisone.  It has not helped.  She is now having some ringing on the right as well.  She is scheduled for ENT follow up next week.  Fever blisters - pt requests refill on Valtrex.  She has not had a recent episode of HSV but wants to keep medication on hand.  She does note that ENT mentioned that her sx could be from zoster but did not offer treatment.   Review of Systems  Constitutional: Negative for chills, fatigue and fever.  HENT: Positive for ear pain, hearing loss (on testing but she does not notice it) and tinnitus. Negative for ear discharge, postnasal drip, sinus pressure, sinus pain, sore throat and trouble swallowing.   Respiratory: Negative for cough and wheezing.   Cardiovascular: Negative for chest pain.  Gastrointestinal: Negative for diarrhea.  Skin: Negative for rash.  Allergic/Immunologic: Positive for environmental allergies.  Neurological: Negative for dizziness, light-headedness and headaches.    Patient Active Problem List   Diagnosis Date Noted  . Generalized anxiety disorder 09/23/2018  . Anemia 02/11/2018  . Pain of left hip joint 02/11/2018  . Arthritis of left hip 12/09/2016  . Osteopenia determined by x-ray 12/09/2016  . Mixed hyperlipidemia 10/28/2016  .  Neuropathic pain, leg, bilateral 07/14/2016  . Environmental and seasonal allergies 07/14/2016  . Heart palpitations 08/22/2015  . Essential hypertension 08/15/2014  . Irritable bowel syndrome with constipation 06/27/2014  . Cannot sleep 06/27/2014  . Neuritis or radiculitis due to rupture of lumbar intervertebral disc 06/27/2014  . Beat, premature ventricular 06/27/2014  . Phlebectasia 06/27/2014    Allergies  Allergen Reactions  . Codeine Sulfate Itching  . Duloxetine Nausea Only    Past Surgical History:  Procedure Laterality Date  . ABDOMINAL HYSTERECTOMY    . COLONOSCOPY    . COLONOSCOPY WITH PROPOFOL N/A 01/11/2016   Procedure: COLONOSCOPY WITH PROPOFOL;  Surgeon: Hulen Luster, MD;  Location: [facility] ENDOSCOPY;  Service: Gastroenterology;  Laterality: N/A;  . ESOPHAGOGASTRODUODENOSCOPY  2013   normal  . ESOPHAGOGASTRODUODENOSCOPY (EGD) WITH PROPOFOL N/A 01/11/2016   Procedure: ESOPHAGOGASTRODUODENOSCOPY (EGD) WITH PROPOFOL;  Surgeon: Hulen Luster, MD;  Location: [facility] ENDOSCOPY;  Service: Gastroenterology;  Laterality: N/A;  . eye brow surgery    . HERNIA REPAIR     femoral  . HYSTEROSCOPY W/D&C N/A 05/07/2015   Procedure: DILATATION AND CURETTAGE /HYSTEROSCOPY;  Surgeon: Benjaman Kindler, MD;  Location: [facility] ORS;  Service: Gynecology;  Laterality: N/A;  . LAPAROSCOPIC TOTAL HYSTERECTOMY    . leg vein    . TONSILLECTOMY    . VARICOSE VEIN SURGERY     laser    Social History   Tobacco Use  . Smoking status: Never Smoker  .  Smokeless tobacco: Never Used  Substance Use Topics  . Alcohol use: No    Alcohol/week: 0.0 standard drinks  . Drug use: No     Medication list has been reviewed and updated.  Current Meds  Medication Sig  . [START ON 02/24/2019] ALPRAZolam (XANAX) 0.5 MG tablet Take 1 tablet (0.5 mg total) by mouth at bedtime.  Marland Kitchen estradiol (VIVELLE-DOT) 0.05 MG/24HR patch Place 1 patch (0.05 mg total) onto the skin once a week.  . fluticasone (FLONASE) 50 MCG/ACT  nasal spray Place 2 sprays into both nostrils daily as needed for allergies or rhinitis.  Marland Kitchen ibuprofen (ADVIL,MOTRIN) 200 MG tablet Take 600 mg by mouth every 6 (six) hours as needed.  . metoprolol succinate (TOPROL-XL) 25 MG 24 hr tablet Take 1 tablet (25 mg total) by mouth daily as needed. Take with or immediately following a meal.  . Omega-3 Fatty Acids (FISH OIL) 1000 MG CAPS Take by mouth daily.   . [DISCONTINUED] sertraline (ZOLOFT) 25 MG tablet Take 25 mg by mouth daily.    PHQ 2/9 Scores 02/15/2019 12/13/2018 09/23/2018 08/09/2018  PHQ - 2 Score 1 0 0 0  PHQ- 9 Score 1 - 0 -    BP Readings from Last 3 Encounters:  02/15/19 132/84  01/19/19 (!) 142/62  12/13/18 134/80    Physical Exam Vitals signs and nursing note reviewed.  Constitutional:      General: She is not in acute distress.    Appearance: She is well-developed.  HENT:     Head: Normocephalic and atraumatic.     Right Ear: Tympanic membrane normal. Tympanic membrane is not perforated, erythematous or retracted.     Left Ear: Tympanic membrane is bulging. Tympanic membrane is not perforated or erythematous.     Nose:     Right Sinus: No maxillary sinus tenderness or frontal sinus tenderness.     Left Sinus: No maxillary sinus tenderness or frontal sinus tenderness.     Mouth/Throat:     Mouth: Mucous membranes are moist.     Pharynx: No posterior oropharyngeal erythema or uvula swelling.  Neck:     Musculoskeletal: Normal range of motion and neck supple.  Cardiovascular:     Rate and Rhythm: Normal rate and regular rhythm.     Heart sounds: Normal heart sounds.  Pulmonary:     Effort: Pulmonary effort is normal. No respiratory distress.     Breath sounds: Normal breath sounds. No decreased breath sounds or wheezing.  Musculoskeletal: Normal range of motion.  Skin:    General: Skin is warm and dry.     Findings: No rash.  Neurological:     Mental Status: She is alert and oriented to person, place, and time.   Psychiatric:        Attention and Perception: Attention normal.        Mood and Affect: Mood normal.        Behavior: Behavior normal.        Thought Content: Thought content normal.     Wt Readings from Last 3 Encounters:  02/15/19 124 lb (56.2 kg)  01/19/19 123 lb (55.8 kg)  12/13/18 123 lb (55.8 kg)    BP 132/84   Pulse 94   Temp 98 F (36.7 C) (Oral)   Resp 16   Ht 5\' 2"  (1.575 m)   Wt 124 lb (56.2 kg)   LMP 04/05/2014   SpO2 98%   BMI 22.68 kg/m   Assessment and Plan: 1. Eustachian tube  disorder, bilateral Continue Flonase Use sudafed three times per day No evidence of sinus/ear/throat infection Follow up with ENT [id number], hearing loss and ear sx  2. HSV-1 infection Hx of fever blisters - ENT suggested that her ear sx may be due to Varicella Will try valtrex 1 gm bid x 1 week The remainder of the Rx she can use PRN fever blisters - valACYclovir (VALTREX) 1000 MG tablet; Take 1 tablet (1,000 mg total) by mouth 2 (two) times daily for 30 days.  Dispense: 60 tablet; Refill: 0  3. Tinnitus aurium, bilateral May need further work up  Partially dictated using Editor, commissioning. Any errors are unintentional.  Halina Maidens, MD [facility]  02/15/2019

## 2019-02-15 NOTE — Patient Instructions (Signed)
Take the Valtrex 1 gm twice a day for one week  Use Sudafed up to three times a day for ear congestion

## 2019-03-21 ENCOUNTER — Telehealth: Payer: Self-pay | Admitting: Cardiovascular Disease

## 2019-03-21 NOTE — Telephone Encounter (Signed)
Atypical chest pain Prior history of anxiety, insomnia Probably recommend she talk with primary care about this Lots of home stressors  If she feels it is helping with take metoprolol 25 twice daily If blood pressure after taking medication continues to run high We can always add additional medication but primary care may want to add anxiety medication which would help anxiety and pressure

## 2019-03-21 NOTE — Telephone Encounter (Signed)
It sounds as if she has been having these symptoms well before starting her metoprolol. On review of previous primary cardiologist documentation, it appears she has a history of atypical chest pain when laying down.   Increased stress could certainly be causing all of her symptoms, especially if she is not sleeping well. Has her fatigue increased since starting the metoprolol?    Did she want to stop the metoprolol, or does she feel as if it is not helping her?   Does she have any BP readings from after she takes the medication? The note stated she feels her BP is improved with the medications - has she taken her BP after the medication to confirm this? It would be helpful to have BP times and readings 1-2 hours after her medication.   I will defer any medication changes to her primary cardiologist and forward this message to him in case of further recommendations.

## 2019-03-21 NOTE — Telephone Encounter (Signed)
Call to patient, she reports hypertension, intermittent chest pain, fatigue, nausea and ringing in ears over the past 4-5 weeks.   Had OV w Gollan on 3/4. He started her on Metoprolol 25 1 x daily.   On Friday she began taking 25 BID and pressures have improved. She is still having some ringing in ears and fatigue.   She also reports intermittent R sided anterior chest pain while laying in bed that feels like cramping. Lasts around 5 min. Last episode on sat. She did not take anything for pain. Resolves spontaneously.   Pt does indorse increase in stress related to her son.   Included blood pressures were taken in am prior to metoprolol. She feels blood pressure has improved since she started taking BID but sx of tinnitus, nausea and fatigue persist.   Routed to provider to review.

## 2019-03-21 NOTE — Telephone Encounter (Signed)
Pt c/o BP issue: STAT if pt c/o blurred vision, one-sided weakness or slurred speech  1. What are your last 5 BP readings?  4/29-189/92 4/30-152/84 5/1-198/118 5/2-154/86 5/3-156/86 5/4-144/84 2. Are you having any other symptoms (ex. Dizziness, headache, blurred vision, passed out)?  Ears ringing "like crazy", states she has been "feeling real bad lately"  3. What is your BP issue? Elevated Pt started taking Metoprolol 5 days ago

## 2019-03-22 MED ORDER — METOPROLOL SUCCINATE ER 25 MG PO TB24: 25.0000 mg | ORAL_TABLET | Freq: Two times a day (BID) | ORAL | 3 refills | Status: DC

## 2019-03-22 NOTE — Telephone Encounter (Signed)
Returned call to patient with advice from Dr. Rockey Situ. New script sent into patient preferred pharmacy.   Advised pt to call for any further questions or concerns.

## 2019-03-28 DIAGNOSIS — M7062 Trochanteric bursitis, left hip: Secondary | ICD-10-CM | POA: Diagnosis not present

## 2019-03-28 DIAGNOSIS — M5416 Radiculopathy, lumbar region: Secondary | ICD-10-CM | POA: Diagnosis not present

## 2019-03-28 DIAGNOSIS — M1612 Unilateral primary osteoarthritis, left hip: Secondary | ICD-10-CM | POA: Diagnosis not present

## 2019-03-29 ENCOUNTER — Encounter: Payer: Self-pay | Admitting: Internal Medicine

## 2019-03-29 ENCOUNTER — Ambulatory Visit (INDEPENDENT_AMBULATORY_CARE_PROVIDER_SITE_OTHER): Payer: 59 | Admitting: Internal Medicine

## 2019-03-29 ENCOUNTER — Other Ambulatory Visit: Payer: Self-pay

## 2019-03-29 VITALS — BP 104/64 | HR 72 | Ht 62.0 in | Wt 124.0 lb

## 2019-03-29 DIAGNOSIS — L820 Inflamed seborrheic keratosis: Secondary | ICD-10-CM | POA: Diagnosis not present

## 2019-03-29 DIAGNOSIS — G5793 Unspecified mononeuropathy of bilateral lower limbs: Secondary | ICD-10-CM

## 2019-03-29 DIAGNOSIS — Z1159 Encounter for screening for other viral diseases: Secondary | ICD-10-CM | POA: Diagnosis not present

## 2019-03-29 DIAGNOSIS — F411 Generalized anxiety disorder: Secondary | ICD-10-CM | POA: Diagnosis not present

## 2019-03-29 DIAGNOSIS — Z0000 Encounter for general adult medical examination without abnormal findings: Secondary | ICD-10-CM | POA: Diagnosis not present

## 2019-03-29 DIAGNOSIS — E559 Vitamin D deficiency, unspecified: Secondary | ICD-10-CM | POA: Diagnosis not present

## 2019-03-29 DIAGNOSIS — I10 Essential (primary) hypertension: Secondary | ICD-10-CM | POA: Diagnosis not present

## 2019-03-29 DIAGNOSIS — D508 Other iron deficiency anemias: Secondary | ICD-10-CM

## 2019-03-29 DIAGNOSIS — D485 Neoplasm of uncertain behavior of skin: Secondary | ICD-10-CM | POA: Diagnosis not present

## 2019-03-29 DIAGNOSIS — D239 Other benign neoplasm of skin, unspecified: Secondary | ICD-10-CM | POA: Diagnosis not present

## 2019-03-29 DIAGNOSIS — G4700 Insomnia, unspecified: Secondary | ICD-10-CM | POA: Diagnosis not present

## 2019-03-29 LAB — POCT URINALYSIS DIPSTICK
Bilirubin, UA: NEGATIVE
Blood, UA: NEGATIVE
Glucose, UA: NEGATIVE
Ketones, UA: NEGATIVE
Leukocytes, UA: NEGATIVE
Nitrite, UA: NEGATIVE
Protein, UA: NEGATIVE
Spec Grav, UA: 1.01 (ref 1.010–1.025)
Urobilinogen, UA: 0.2 E.U./dL
pH, UA: 6 (ref 5.0–8.0)

## 2019-03-29 MED ORDER — TRAMADOL HCL 50 MG PO TABS: 50.0000 mg | ORAL_TABLET | Freq: Four times a day (QID) | ORAL | 0 refills | Status: DC | PRN

## 2019-03-29 NOTE — Progress Notes (Signed)
Date:  03/29/2019   Name:  Carla Green   DOB:  07-20-1961   MRN:  [id number]   Chief Complaint: Annual Exam (Breast Exam, no pap- hysterectomy. ) Carla Green is a 58 y.o. female who presents today for her Complete Annual Exam. She feels fairly well. She reports exercising some - walking. She reports she is sleeping fairly well using xanax.  Mammogram cone 12/2018 Colonoscopy 12/2015 DEXA 12/2018 Hep C due  Hypertension  This is a chronic problem. The problem has been gradually improving since onset. The problem is controlled. Associated symptoms include palpitations (occasional). Pertinent negatives include no chest pain, headaches or shortness of breath. Past treatments include beta blockers (now on metoprolol bid). The current treatment provides significant improvement.  Insomnia  Primary symptoms: sleep disturbance, difficulty falling asleep.  The problem occurs nightly. The problem is unchanged. Treatments tried: xanax.    Review of Systems  Constitutional: Positive for fatigue (since increasing metoprolol). Negative for chills and fever.  HENT: Negative for congestion, hearing loss, tinnitus, trouble swallowing and voice change.   Eyes: Negative for visual disturbance.  Respiratory: Negative for cough, chest tightness, shortness of breath and wheezing.   Cardiovascular: Positive for palpitations (occasional). Negative for chest pain and leg swelling.  Gastrointestinal: Negative for abdominal pain, constipation, diarrhea and vomiting.  Endocrine: Negative for polydipsia and polyuria.  Genitourinary: Negative for dysuria, frequency, genital sores, vaginal bleeding and vaginal discharge.  Musculoskeletal: Positive for arthralgias (bursitis in hips). Negative for gait problem and joint swelling.  Skin: Negative for color change and rash.  Allergic/Immunologic: Positive for environmental allergies.  Neurological: Negative for dizziness, tremors, light-headedness and headaches.   Hematological: Negative for adenopathy. Does not bruise/bleed easily.  Psychiatric/Behavioral: Positive for sleep disturbance. Negative for dysphoric mood. The patient has insomnia. The patient is not nervous/anxious.     Patient Active Problem List   Diagnosis Date Noted  . Tinnitus aurium, bilateral 02/15/2019  . HSV-1 infection 02/15/2019  . Eustachian tube disorder, bilateral 02/15/2019  . Generalized anxiety disorder 09/23/2018  . Anemia 02/11/2018  . Pain of left hip joint 02/11/2018  . Arthritis of left hip 12/09/2016  . Osteopenia determined by x-ray 12/09/2016  . Mixed hyperlipidemia 10/28/2016  . Neuropathic pain, leg, bilateral 07/14/2016  . Environmental and seasonal allergies 07/14/2016  . Heart palpitations 08/22/2015  . Essential hypertension 08/15/2014  . Irritable bowel syndrome with constipation 06/27/2014  . Cannot sleep 06/27/2014  . Neuritis or radiculitis due to rupture of lumbar intervertebral disc 06/27/2014  . Beat, premature ventricular 06/27/2014  . Phlebectasia 06/27/2014    Allergies  Allergen Reactions  . Codeine Sulfate Itching  . Duloxetine Nausea Only    Past Surgical History:  Procedure Laterality Date  . ABDOMINAL HYSTERECTOMY    . COLONOSCOPY    . COLONOSCOPY WITH PROPOFOL N/A 01/11/2016   Procedure: COLONOSCOPY WITH PROPOFOL;  Surgeon: Hulen Luster, MD;  Location: [facility] ENDOSCOPY;  Service: Gastroenterology;  Laterality: N/A;  . ESOPHAGOGASTRODUODENOSCOPY  2013   normal  . ESOPHAGOGASTRODUODENOSCOPY (EGD) WITH PROPOFOL N/A 01/11/2016   Procedure: ESOPHAGOGASTRODUODENOSCOPY (EGD) WITH PROPOFOL;  Surgeon: Hulen Luster, MD;  Location: [facility] ENDOSCOPY;  Service: Gastroenterology;  Laterality: N/A;  . eye brow surgery    . HERNIA REPAIR     femoral  . HYSTEROSCOPY W/D&C N/A 05/07/2015   Procedure: DILATATION AND CURETTAGE /HYSTEROSCOPY;  Surgeon: Benjaman Kindler, MD;  Location: [facility] ORS;  Service: Gynecology;  Laterality: N/A;  . LAPAROSCOPIC TOTAL  HYSTERECTOMY    .  leg vein    . TONSILLECTOMY    . VARICOSE VEIN SURGERY     laser    Social History   Tobacco Use  . Smoking status: Never Smoker  . Smokeless tobacco: Never Used  Substance Use Topics  . Alcohol use: No    Alcohol/week: 0.0 standard drinks  . Drug use: No     Medication list has been reviewed and updated.  Current Meds  Medication Sig  . ALPRAZolam (XANAX) 0.5 MG tablet Take 1 tablet (0.5 mg total) by mouth at bedtime.  Marland Kitchen estradiol (VIVELLE-DOT) 0.05 MG/24HR patch Place 1 patch (0.05 mg total) onto the skin once a week.  . fluticasone (FLONASE) 50 MCG/ACT nasal spray Place 2 sprays into both nostrils daily as needed for allergies or rhinitis.  Marland Kitchen ibuprofen (ADVIL,MOTRIN) 200 MG tablet Take 600 mg by mouth every 6 (six) hours as needed.  . metoprolol succinate (TOPROL-XL) 25 MG 24 hr tablet Take 1 tablet (25 mg total) by mouth 2 (two) times a day. Take with or immediately following a meal.  . Omega-3 Fatty Acids (FISH OIL) 1000 MG CAPS Take by mouth daily.     PHQ 2/9 Scores 03/29/2019 02/15/2019 12/13/2018 09/23/2018  PHQ - 2 Score 0 1 0 0  PHQ- 9 Score 4 1 - 0    BP Readings from Last 3 Encounters:  03/29/19 104/64  02/15/19 132/84  01/19/19 (!) 142/62    Physical Exam Vitals signs and nursing note reviewed.  Constitutional:      General: She is not in acute distress.    Appearance: She is well-developed.  HENT:     Head: Normocephalic and atraumatic.     Right Ear: Tympanic membrane and ear canal normal.     Left Ear: Tympanic membrane and ear canal normal.     Nose:     Right Sinus: No maxillary sinus tenderness.     Left Sinus: No maxillary sinus tenderness.     Mouth/Throat:     Pharynx: Uvula midline.  Eyes:     General: No scleral icterus.       Right eye: No discharge.        Left eye: No discharge.     Conjunctiva/sclera: Conjunctivae normal.  Neck:     Musculoskeletal: Normal range of motion. No erythema.     Thyroid: No  thyromegaly.     Vascular: No carotid bruit.  Cardiovascular:     Rate and Rhythm: Normal rate and regular rhythm.     Pulses: Normal pulses.     Heart sounds: Normal heart sounds.  Pulmonary:     Effort: Pulmonary effort is normal. No respiratory distress.     Breath sounds: No wheezing.  Chest:     Breasts:        Right: No mass, nipple discharge, skin change or tenderness.        Left: No mass, nipple discharge, skin change or tenderness.  Abdominal:     General: Bowel sounds are normal.     Palpations: Abdomen is soft.     Tenderness: There is no abdominal tenderness.  Musculoskeletal: Normal range of motion.        General: Tenderness (over both lateral hip joints) present.     Right lower leg: No edema.     Left lower leg: No edema.  Lymphadenopathy:     Cervical: No cervical adenopathy.  Skin:    General: Skin is warm and dry.     Findings: No  rash.  Neurological:     Mental Status: She is alert and oriented to person, place, and time.     Cranial Nerves: No cranial nerve deficit.     Sensory: No sensory deficit.     Deep Tendon Reflexes: Reflexes are normal and symmetric.  Psychiatric:        Speech: Speech normal.        Behavior: Behavior normal.        Thought Content: Thought content normal.     Wt Readings from Last 3 Encounters:  03/29/19 124 lb (56.2 kg)  02/15/19 124 lb (56.2 kg)  01/19/19 123 lb (55.8 kg)    BP 104/64   Pulse 72   Ht 5\' 2"  (1.575 m)   Wt 124 lb (56.2 kg)   LMP 04/05/2014   SpO2 100%   BMI 22.68 kg/m   Assessment and Plan: 1. Annual physical exam Normal exam - Hemoglobin A1c - Lipid panel - POCT urinalysis dipstick  2. Essential hypertension controlled - CBC with Differential/Platelet - Comprehensive metabolic panel  3. Generalized anxiety disorder Check labs, continue xanax prn - TSH + free T4  4. Need for hepatitis C screening test - Hepatitis C antibody  5. Other iron deficiency anemia - CBC with  Differential/Platelet - Iron, TIBC and Ferritin Panel  6. Insomnia, unspecified type Sample Belsomra 15 mg given  7. Neuropathic pain, leg, bilateral - Vitamin B12 - traMADol (ULTRAM) 50 MG tablet; Take 1 tablet (50 mg total) by mouth every 6 (six) hours as needed.  Dispense: 15 tablet; Refill: 0  8. Vitamin D deficiency - VITAMIN D 25 Hydroxy (Vit-D Deficiency, Fractures)   Partially dictated using Editor, commissioning. Any errors are unintentional.  Halina Maidens, MD [facility]  03/29/2019

## 2019-03-29 NOTE — Patient Instructions (Signed)
Take Belsomra just as you are going to bed to sleep.  Do not take Xanax if you take Belsomra.   Breast Self-Awareness Breast self-awareness means being familiar with how your breasts look and feel. It involves checking your breasts regularly and reporting any changes to your health care provider. Practicing breast self-awareness is important. A change in your breasts can be a sign of a serious medical problem. Being familiar with how your breasts look and feel allows you to find any problems early, when treatment is more likely to be successful. All women should practice breast self-awareness, including women who have had breast implants. How to do a breast self-exam One way to learn what is normal for your breasts and whether your breasts are changing is to do a breast self-exam. To do a breast self-exam: Look for Changes  1. Remove all the clothing above your waist. 2. Stand in front of a mirror in a room with good lighting. 3. Put your hands on your hips. 4. Push your hands firmly downward. 5. Compare your breasts in the mirror. Look for differences between them (asymmetry), such as: ? Differences in shape. ? Differences in size. ? Puckers, dips, and bumps in one breast and not the other. 6. Look at each breast for changes in your skin, such as: ? Redness. ? Scaly areas. 7. Look for changes in your nipples, such as: ? Discharge. ? Bleeding. ? Dimpling. ? Redness. ? A change in position. Feel for Changes Carefully feel your breasts for lumps and changes. It is best to do this while lying on your back on the floor and again while sitting or standing in the shower or tub with soapy water on your skin. Feel each breast in the following way:  Place the arm on the side of the breast you are examining above your head.  Feel your breast with the other hand.  Start in the nipple area and make  inch (2 cm) overlapping circles to feel your breast. Use the pads of your three middle fingers  to do this. Apply light pressure, then medium pressure, then firm pressure. The light pressure will allow you to feel the tissue closest to the skin. The medium pressure will allow you to feel the tissue that is a little deeper. The firm pressure will allow you to feel the tissue close to the ribs.  Continue the overlapping circles, moving downward over the breast until you feel your ribs below your breast.  Move one finger-width toward the center of the body. Continue to use the  inch (2 cm) overlapping circles to feel your breast as you move slowly up toward your collarbone.  Continue the up and down exam using all three pressures until you reach your armpit.  Write Down What You Find  Write down what is normal for each breast and any changes that you find. Keep a written record with breast changes or normal findings for each breast. By writing this information down, you do not need to depend only on memory for size, tenderness, or location. Write down where you are in your menstrual cycle, if you are still menstruating. If you are having trouble noticing differences in your breasts, do not get discouraged. With time you will become more familiar with the variations in your breasts and more comfortable with the exam. How often should I examine my breasts? Examine your breasts every month. If you are breastfeeding, the best time to examine your breasts is after a feeding  or after using a breast pump. If you menstruate, the best time to examine your breasts is 5-7 days after your period is over. During your period, your breasts are lumpier, and it may be more difficult to notice changes. When should I see my health care provider? See your health care provider if you notice:  A change in shape or size of your breasts or nipples.  A change in the skin of your breast or nipples, such as a reddened or scaly area.  Unusual discharge from your nipples.  A lump or thick area that was not there  before.  Pain in your breasts.  Anything that concerns you. This information is not intended to replace advice given to you by your health care provider. Make sure you discuss any questions you have with your health care provider. Document Released: 11/03/2005 Document Revised: 04/10/2016 Document Reviewed: 09/23/2015 Elsevier Interactive Patient Education  2019 [employer].

## 2019-03-30 ENCOUNTER — Encounter: Payer: Self-pay | Admitting: Internal Medicine

## 2019-03-30 DIAGNOSIS — E559 Vitamin D deficiency, unspecified: Secondary | ICD-10-CM | POA: Insufficient documentation

## 2019-03-30 LAB — COMPREHENSIVE METABOLIC PANEL
ALT: 30 IU/L (ref 0–32)
AST: 22 IU/L (ref 0–40)
Albumin/Globulin Ratio: 2.4 — ABNORMAL HIGH (ref 1.2–2.2)
Albumin: 4.8 g/dL (ref 3.8–4.9)
Alkaline Phosphatase: 48 IU/L (ref 39–117)
BUN/Creatinine Ratio: 21 (ref 9–23)
BUN: 14 mg/dL (ref 6–24)
Bilirubin Total: 0.4 mg/dL (ref 0.0–1.2)
CO2: 22 mmol/L (ref 20–29)
Calcium: 9.6 mg/dL (ref 8.7–10.2)
Chloride: 103 mmol/L (ref 96–106)
Creatinine, Ser: 0.66 mg/dL (ref 0.57–1.00)
GFR calc Af Amer: 113 mL/min/{1.73_m2} (ref >59)
GFR calc non Af Amer: 98 mL/min/{1.73_m2} (ref >59)
Globulin, Total: 2 g/dL (ref 1.5–4.5)
Glucose: 91 mg/dL (ref 65–99)
Potassium: 4 mmol/L (ref 3.5–5.2)
Sodium: 139 mmol/L (ref 134–144)
Total Protein: 6.8 g/dL (ref 6.0–8.5)

## 2019-03-30 LAB — CBC WITH DIFFERENTIAL/PLATELET
Basophils Absolute: 0 10*3/uL (ref 0.0–0.2)
Basos: 0 %
EOS (ABSOLUTE): 0 10*3/uL (ref 0.0–0.4)
Eos: 0 %
Hematocrit: 37.8 % (ref 34.0–46.6)
Hemoglobin: 13.2 g/dL (ref 11.1–15.9)
Immature Grans (Abs): 0 10*3/uL (ref 0.0–0.1)
Immature Granulocytes: 0 %
Lymphocytes Absolute: 1 10*3/uL (ref 0.7–3.1)
Lymphs: 8 %
MCH: 32 pg (ref 26.6–33.0)
MCHC: 34.9 g/dL (ref 31.5–35.7)
MCV: 92 fL (ref 79–97)
Monocytes Absolute: 0.5 10*3/uL (ref 0.1–0.9)
Monocytes: 4 %
Neutrophils Absolute: 12.3 10*3/uL — ABNORMAL HIGH (ref 1.4–7.0)
Neutrophils: 88 %
Platelets: 218 10*3/uL (ref 150–450)
RBC: 4.13 x10E6/uL (ref 3.77–5.28)
RDW: 12.6 % (ref 11.7–15.4)
WBC: 13.9 10*3/uL — ABNORMAL HIGH (ref 3.4–10.8)

## 2019-03-30 LAB — IRON,TIBC AND FERRITIN PANEL
Ferritin: 60 ng/mL (ref 15–150)
Iron Saturation: 37 % (ref 15–55)
Iron: 118 ug/dL (ref 27–159)
Total Iron Binding Capacity: 321 ug/dL (ref 250–450)
UIBC: 203 ug/dL (ref 131–425)

## 2019-03-30 LAB — LIPID PANEL
Chol/HDL Ratio: 4.1 ratio (ref 0.0–4.4)
Cholesterol, Total: 288 mg/dL — ABNORMAL HIGH (ref 100–199)
HDL: 70 mg/dL (ref >39)
LDL Calculated: 201 mg/dL — ABNORMAL HIGH (ref 0–99)
Triglycerides: 85 mg/dL (ref 0–149)
VLDL Cholesterol Cal: 17 mg/dL (ref 5–40)

## 2019-03-30 LAB — VITAMIN D 25 HYDROXY (VIT D DEFICIENCY, FRACTURES): Vit D, 25-Hydroxy: 29.8 ng/mL — ABNORMAL LOW (ref 30.0–100.0)

## 2019-03-30 LAB — VITAMIN B12: Vitamin B-12: 716 pg/mL (ref 232–1245)

## 2019-03-30 LAB — HEPATITIS C ANTIBODY: Hep C Virus Ab: <0.1 s/co ratio (ref 0.0–0.9)

## 2019-03-30 LAB — TSH+FREE T4
Free T4: 0.94 ng/dL (ref 0.82–1.77)
TSH: 0.318 u[IU]/mL — ABNORMAL LOW (ref 0.450–4.500)

## 2019-03-30 LAB — HEMOGLOBIN A1C
Est. average glucose Bld gHb Est-mCnc: 108 mg/dL
Hgb A1c MFr Bld: 5.4 % (ref 4.8–5.6)

## 2019-04-12 DIAGNOSIS — M1612 Unilateral primary osteoarthritis, left hip: Secondary | ICD-10-CM | POA: Diagnosis not present

## 2019-05-25 ENCOUNTER — Encounter: Payer: Self-pay | Admitting: Internal Medicine

## 2019-05-25 ENCOUNTER — Telehealth: Payer: Self-pay | Admitting: Internal Medicine

## 2019-05-25 ENCOUNTER — Other Ambulatory Visit: Payer: Self-pay

## 2019-05-25 ENCOUNTER — Ambulatory Visit (INDEPENDENT_AMBULATORY_CARE_PROVIDER_SITE_OTHER): Payer: 59 | Admitting: Internal Medicine

## 2019-05-25 VITALS — BP 130/76 | Temp 98.1°F | Ht 62.0 in | Wt 124.0 lb

## 2019-05-25 DIAGNOSIS — J40 Bronchitis, not specified as acute or chronic: Secondary | ICD-10-CM

## 2019-05-25 MED ORDER — ALBUTEROL SULFATE HFA 108 (90 BASE) MCG/ACT IN AERS: 2.0000 | INHALATION_SPRAY | RESPIRATORY_TRACT | 0 refills | Status: DC | PRN

## 2019-05-25 MED ORDER — LEVOFLOXACIN 500 MG PO TABS: 500.0000 mg | ORAL_TABLET | Freq: Every day | ORAL | 0 refills | Status: AC

## 2019-05-25 NOTE — Progress Notes (Signed)
Date:  05/25/2019   Name:  Carla Green   DOB:  1961-10-16   MRN:  [id number]  I connected with this patient, Carla Green, by telephone at the patient's home.  I verified that I am speaking with the correct person using two identifiers. This visit was conducted via telephone due to the Covid-19 outbreak from my office at [facility] in [place], [place]. I discussed the limitations, risks, security and privacy concerns of performing an evaluation and management service by telephone. I also discussed with the patient that there may be a patient responsible charge related to this service. The patient expressed understanding and agreed to proceed.  Chief Complaint: Headache (Congestion, headache. X 1 week ago. Sleeping in front of air conditioning unit. Stuck in chest. Temp: 98.1 )  Headache  Pertinent negatives include no coughing, dizziness, ear pain, fever, sinus pressure, sore throat or vomiting.  Cough This is a new problem. The current episode started in the past 7 days. The cough is non-productive (chest congestion and rattling). Associated symptoms include chills, headaches and wheezing. Pertinent negatives include no chest pain, ear pain, fever, sore throat or shortness of breath. She has tried OTC cough suppressant (Delsym and mucinex) for the symptoms. The treatment provided no relief.    Review of Systems  Constitutional: Positive for chills and fatigue. Negative for fever.  HENT: Positive for congestion. Negative for ear pain, sinus pressure, sore throat and trouble swallowing.   Respiratory: Positive for chest tightness and wheezing. Negative for cough and shortness of breath.   Cardiovascular: Negative for chest pain and palpitations.  Gastrointestinal: Negative for constipation, diarrhea and vomiting.  Neurological: Positive for headaches. Negative for dizziness.  Psychiatric/Behavioral: Negative for sleep disturbance.    Patient Active Problem List   Diagnosis Date Noted   . Vitamin D deficiency 03/30/2019  . Tinnitus aurium, bilateral 02/15/2019  . HSV-1 infection 02/15/2019  . Eustachian tube disorder, bilateral 02/15/2019  . Generalized anxiety disorder 09/23/2018  . Anemia 02/11/2018  . Arthritis of left hip 12/09/2016  . Osteopenia determined by x-ray 12/09/2016  . Mixed hyperlipidemia 10/28/2016  . Neuropathic pain, leg, bilateral 07/14/2016  . Environmental and seasonal allergies 07/14/2016  . Essential hypertension 08/15/2014  . Irritable bowel syndrome with constipation 06/27/2014  . Cannot sleep 06/27/2014  . Neuritis or radiculitis due to rupture of lumbar intervertebral disc 06/27/2014  . Beat, premature ventricular 06/27/2014  . Phlebectasia 06/27/2014    Allergies  Allergen Reactions  . Codeine Sulfate Itching  . Duloxetine Nausea Only    Past Surgical History:  Procedure Laterality Date  . ABDOMINAL HYSTERECTOMY    . COLONOSCOPY    . COLONOSCOPY WITH PROPOFOL N/A 01/11/2016   Procedure: COLONOSCOPY WITH PROPOFOL;  Surgeon: Hulen Luster, MD;  Location: [facility] ENDOSCOPY;  Service: Gastroenterology;  Laterality: N/A;  . ESOPHAGOGASTRODUODENOSCOPY  2013   normal  . ESOPHAGOGASTRODUODENOSCOPY (EGD) WITH PROPOFOL N/A 01/11/2016   Procedure: ESOPHAGOGASTRODUODENOSCOPY (EGD) WITH PROPOFOL;  Surgeon: Hulen Luster, MD;  Location: [facility] ENDOSCOPY;  Service: Gastroenterology;  Laterality: N/A;  . eye brow surgery    . HERNIA REPAIR     femoral  . HYSTEROSCOPY W/D&C N/A 05/07/2015   Procedure: DILATATION AND CURETTAGE /HYSTEROSCOPY;  Surgeon: Benjaman Kindler, MD;  Location: [facility] ORS;  Service: Gynecology;  Laterality: N/A;  . LAPAROSCOPIC TOTAL HYSTERECTOMY    . leg vein    . TONSILLECTOMY    . VARICOSE VEIN SURGERY     laser  Social History   Tobacco Use  . Smoking status: Never Smoker  . Smokeless tobacco: Never Used  Substance Use Topics  . Alcohol use: No    Alcohol/week: 0.0 standard drinks  . Drug use: No     Medication list  has been reviewed and updated.  Current Meds  Medication Sig  . ALPRAZolam (XANAX) 0.5 MG tablet Take 1 tablet (0.5 mg total) by mouth at bedtime.  Marland Kitchen estradiol (VIVELLE-DOT) 0.05 MG/24HR patch Place 1 patch (0.05 mg total) onto the skin once a week.  . fluticasone (FLONASE) 50 MCG/ACT nasal spray Place 2 sprays into both nostrils daily as needed for allergies or rhinitis.  Marland Kitchen ibuprofen (ADVIL,MOTRIN) 200 MG tablet Take 600 mg by mouth every 6 (six) hours as needed.  . metoprolol succinate (TOPROL-XL) 25 MG 24 hr tablet Take 1 tablet (25 mg total) by mouth 2 (two) times a day. Take with or immediately following a meal.  . Omega-3 Fatty Acids (FISH OIL) 1000 MG CAPS Take by mouth daily.   . traMADol (ULTRAM) 50 MG tablet Take 1 tablet (50 mg total) by mouth every 6 (six) hours as needed.    PHQ 2/9 Scores 05/25/2019 03/29/2019 02/15/2019 12/13/2018  PHQ - 2 Score 0 0 1 0  PHQ- 9 Score 0 4 1 -    BP Readings from Last 3 Encounters:  05/25/19 130/76  03/29/19 104/64  02/15/19 132/84    Physical Exam Vitals signs reviewed.  Pulmonary:     Effort: Pulmonary effort is normal.     Comments: No cough, dyspnea or wheezing noted during the call  Neurological:     Mental Status: She is alert.  Psychiatric:        Attention and Perception: Attention normal.        Mood and Affect: Mood normal.     Wt Readings from Last 3 Encounters:  05/25/19 124 lb (56.2 kg)  03/29/19 124 lb (56.2 kg)  02/15/19 124 lb (56.2 kg)    BP 130/76   Temp 98.1 F (36.7 C) (Oral)   Ht 5\' 2"  (1.575 m)   Wt 124 lb (56.2 kg)   LMP 04/05/2014   BMI 22.68 kg/m   Assessment and Plan: 1. Bronchitis Continue Mucinex bid Fluids/rest - levofloxacin (LEVAQUIN) 500 MG tablet; Take 1 tablet (500 mg total) by mouth daily for 7 days.  Dispense: 7 tablet; Refill: 0 - albuterol (VENTOLIN HFA) 108 (90 Base) MCG/ACT inhaler; Inhale 2 puffs into the lungs every 4 (four) hours as needed for wheezing or shortness of breath.   Dispense: 18 g; Refill: 0  I spent 8 minutes on this encounter. Partially dictated using Editor, commissioning. Any errors are unintentional.  Halina Maidens, MD [facility]  05/25/2019

## 2019-05-25 NOTE — Telephone Encounter (Signed)
Congestion in chest, headache.

## 2019-05-25 NOTE — Telephone Encounter (Signed)
Please schedule her for a telephone visit at 240 or 3pm today. Confirm her telephone number we call. Ask her to take her tempeture, and BP before her visit time.  Thank you.

## 2019-07-20 DIAGNOSIS — H524 Presbyopia: Secondary | ICD-10-CM | POA: Diagnosis not present

## 2019-08-11 DIAGNOSIS — Z6823 Body mass index (BMI) 23.0-23.9, adult: Secondary | ICD-10-CM | POA: Diagnosis not present

## 2019-08-11 DIAGNOSIS — R300 Dysuria: Secondary | ICD-10-CM | POA: Diagnosis not present

## 2019-08-11 DIAGNOSIS — N3001 Acute cystitis with hematuria: Secondary | ICD-10-CM | POA: Diagnosis not present

## 2019-08-12 DIAGNOSIS — R300 Dysuria: Secondary | ICD-10-CM | POA: Diagnosis not present

## 2019-08-23 ENCOUNTER — Other Ambulatory Visit: Payer: Self-pay | Admitting: Internal Medicine

## 2019-08-23 DIAGNOSIS — F419 Anxiety disorder, unspecified: Secondary | ICD-10-CM

## 2019-08-23 DIAGNOSIS — L309 Dermatitis, unspecified: Secondary | ICD-10-CM | POA: Diagnosis not present

## 2019-08-26 ENCOUNTER — Telehealth: Payer: Self-pay

## 2019-08-26 DIAGNOSIS — R399 Unspecified symptoms and signs involving the genitourinary system: Secondary | ICD-10-CM | POA: Diagnosis not present

## 2019-08-26 DIAGNOSIS — R300 Dysuria: Secondary | ICD-10-CM | POA: Diagnosis not present

## 2019-08-26 DIAGNOSIS — L299 Pruritus, unspecified: Secondary | ICD-10-CM | POA: Diagnosis not present

## 2019-08-26 DIAGNOSIS — R5383 Other fatigue: Secondary | ICD-10-CM | POA: Diagnosis not present

## 2019-08-26 NOTE — Telephone Encounter (Signed)
Patient called saying that she just left Dr. Migdalia Dk clinic and she ordered a CMP, CBC, and TSH. She told the patient to call our office to call and add a Sed Rate to the lab orders due to a rash the pt is experiencing.  Called and informed pt we cannot add labs to another Dr's orders. Told her Dr. Army Melia recommends her seeing a dermatologist.   Pt said she will call Dr. Migdalia Dk office back and see if they will add a routine sed rate for her labs she needs drawn. Will call us back if she has any other questions.

## 2019-10-11 DIAGNOSIS — L209 Atopic dermatitis, unspecified: Secondary | ICD-10-CM | POA: Diagnosis not present

## 2019-12-14 ENCOUNTER — Ambulatory Visit: Payer: 59 | Admitting: Internal Medicine

## 2020-01-12 DIAGNOSIS — K5909 Other constipation: Secondary | ICD-10-CM | POA: Diagnosis not present

## 2020-01-12 DIAGNOSIS — K5900 Constipation, unspecified: Secondary | ICD-10-CM | POA: Diagnosis not present

## 2020-01-12 DIAGNOSIS — Z800 Family history of malignant neoplasm of digestive organs: Secondary | ICD-10-CM | POA: Diagnosis not present

## 2020-01-12 DIAGNOSIS — R1013 Epigastric pain: Secondary | ICD-10-CM | POA: Diagnosis not present

## 2020-01-24 DIAGNOSIS — L853 Xerosis cutis: Secondary | ICD-10-CM | POA: Diagnosis not present

## 2020-01-24 DIAGNOSIS — L209 Atopic dermatitis, unspecified: Secondary | ICD-10-CM | POA: Diagnosis not present

## 2020-02-06 ENCOUNTER — Other Ambulatory Visit
Admission: RE | Admit: 2020-02-06 | Discharge: 2020-02-06 | Disposition: A | Discharge status: Home / Self Care | Payer: 59 | Source: Ambulatory Visit | Attending: General Surgery | Admitting: General Surgery

## 2020-02-06 ENCOUNTER — Other Ambulatory Visit: Payer: Self-pay

## 2020-02-06 DIAGNOSIS — Z20822 Contact with and (suspected) exposure to covid-19: Secondary | ICD-10-CM | POA: Insufficient documentation

## 2020-02-06 DIAGNOSIS — Z01812 Encounter for preprocedural laboratory examination: Secondary | ICD-10-CM | POA: Insufficient documentation

## 2020-02-06 LAB — SARS CORONAVIRUS 2 (TAT 6-24 HRS): SARS Coronavirus 2: NEGATIVE

## 2020-02-08 ENCOUNTER — Ambulatory Visit: Admission: RE | Admit: 2020-02-08 | Payer: 59 | Source: Home / Self Care | Admitting: General Surgery

## 2020-02-08 ENCOUNTER — Encounter: Admission: RE | Payer: Self-pay | Source: Home / Self Care

## 2020-02-08 SURGERY — COLONOSCOPY WITH PROPOFOL
Anesthesia: General

## 2020-02-14 ENCOUNTER — Other Ambulatory Visit: Payer: Self-pay | Admitting: Internal Medicine

## 2020-02-14 DIAGNOSIS — F419 Anxiety disorder, unspecified: Secondary | ICD-10-CM

## 2020-02-14 DIAGNOSIS — N951 Menopausal and female climacteric states: Secondary | ICD-10-CM

## 2020-03-29 ENCOUNTER — Encounter: Payer: Self-pay | Admitting: Internal Medicine

## 2020-03-29 ENCOUNTER — Ambulatory Visit (INDEPENDENT_AMBULATORY_CARE_PROVIDER_SITE_OTHER): Payer: BLUE CROSS/BLUE SHIELD | Admitting: Internal Medicine

## 2020-03-29 ENCOUNTER — Other Ambulatory Visit: Payer: Self-pay

## 2020-03-29 VITALS — BP 124/66 | HR 78 | Temp 97.8°F | Ht 62.0 in | Wt 128.0 lb

## 2020-03-29 DIAGNOSIS — E559 Vitamin D deficiency, unspecified: Secondary | ICD-10-CM | POA: Diagnosis not present

## 2020-03-29 DIAGNOSIS — G5793 Unspecified mononeuropathy of bilateral lower limbs: Secondary | ICD-10-CM

## 2020-03-29 DIAGNOSIS — F5101 Primary insomnia: Secondary | ICD-10-CM

## 2020-03-29 DIAGNOSIS — E782 Mixed hyperlipidemia: Secondary | ICD-10-CM

## 2020-03-29 DIAGNOSIS — I10 Essential (primary) hypertension: Secondary | ICD-10-CM

## 2020-03-29 DIAGNOSIS — Z0000 Encounter for general adult medical examination without abnormal findings: Secondary | ICD-10-CM | POA: Diagnosis not present

## 2020-03-29 DIAGNOSIS — Z1231 Encounter for screening mammogram for malignant neoplasm of breast: Secondary | ICD-10-CM

## 2020-03-29 DIAGNOSIS — K5904 Chronic idiopathic constipation: Secondary | ICD-10-CM | POA: Insufficient documentation

## 2020-03-29 DIAGNOSIS — N951 Menopausal and female climacteric states: Secondary | ICD-10-CM

## 2020-03-29 DIAGNOSIS — Z23 Encounter for immunization: Secondary | ICD-10-CM

## 2020-03-29 LAB — POCT URINALYSIS DIPSTICK
Bilirubin, UA: NEGATIVE
Blood, UA: NEGATIVE
Glucose, UA: NEGATIVE
Ketones, UA: NEGATIVE
Leukocytes, UA: NEGATIVE
Nitrite, UA: NEGATIVE
Protein, UA: NEGATIVE
Spec Grav, UA: 1.01 (ref 1.010–1.025)
Urobilinogen, UA: 0.2 E.U./dL
pH, UA: 6 (ref 5.0–8.0)

## 2020-03-29 MED ORDER — SHINGRIX 50 MCG/0.5ML IM SUSR: 0.5000 mL | Freq: Once | INTRAMUSCULAR | 1 refills | Status: AC

## 2020-03-29 MED ORDER — TRAMADOL HCL 50 MG PO TABS: 50.0000 mg | ORAL_TABLET | Freq: Four times a day (QID) | ORAL | 0 refills | Status: DC | PRN

## 2020-03-29 MED ORDER — LINACLOTIDE 290 MCG PO CAPS: 290.0000 ug | ORAL_CAPSULE | Freq: Every day | ORAL | 0 refills | Status: DC

## 2020-03-29 NOTE — Progress Notes (Signed)
Date:  03/29/2020   Name:  Carla Green   DOB:  25-Mar-1961   MRN:  [id number]   Chief Complaint: Annual Exam (Breast Exam. No pap. Mammo. Pt aware her insurance is out of network with hers per front desk.) Carla Green is a 59 y.o. female who presents today for her Complete Annual Exam. She feels well. She reports exercising some. She reports she is sleeping fairly well using xanax. She denies breast issues.  Mammogram  12/2018 - she wants to postpone for now Pap - discontinued Colonoscopy  12/2015 Immunization History  Administered Date(s) Administered  . Influenza-Unspecified 09/18/2015, 07/27/2018  . Tdap 12/19/2011    Hypertension This is a chronic problem. The problem is controlled. Pertinent negatives include no chest pain, headaches, palpitations or shortness of breath. Past treatments include beta blockers. The current treatment provides significant improvement. There are no compliance problems.   Insomnia Primary symptoms: sleep disturbance, difficulty falling asleep.  The problem occurs nightly. Past treatments include medication (xanax). The treatment provided significant relief.  Constipation This is a chronic problem. The problem is unchanged. Her stool frequency is 1 time per week or less. She exercises regularly. There has been adequate water intake. Pertinent negatives include no abdominal pain, diarrhea, fever or vomiting. Treatments tried: miralax, colace, senekot-S. Improvement on treatment: only gets relief from senekot-S.    Lab Results  Component Value Date   CREATININE 0.66 03/29/2019   BUN 14 03/29/2019   NA 139 03/29/2019   K 4.0 03/29/2019   CL 103 03/29/2019   CO2 22 03/29/2019   Lab Results  Component Value Date   CHOL 288 (H) 03/29/2019   HDL 70 03/29/2019   LDLCALC 201 (H) 03/29/2019   TRIG 85 03/29/2019   CHOLHDL 4.1 03/29/2019   Lab Results  Component Value Date   TSH 0.318 (L) 03/29/2019   Lab Results  Component Value Date   HGBA1C  5.4 03/29/2019   Lab Results  Component Value Date   WBC 13.9 (H) 03/29/2019   HGB 13.2 03/29/2019   HCT 37.8 03/29/2019   MCV 92 03/29/2019   PLT 218 03/29/2019   Lab Results  Component Value Date   ALT 30 03/29/2019   AST 22 03/29/2019   ALKPHOS 48 03/29/2019   BILITOT 0.4 03/29/2019     Review of Systems  Constitutional: Negative for chills, fatigue and fever.  HENT: Negative for congestion, hearing loss, tinnitus, trouble swallowing and voice change.   Eyes: Negative for visual disturbance.  Respiratory: Negative for cough, chest tightness, shortness of breath and wheezing.   Cardiovascular: Negative for chest pain, palpitations and leg swelling.  Gastrointestinal: Positive for constipation (colonoscopy cancelled). Negative for abdominal pain, diarrhea and vomiting.  Endocrine: Negative for polydipsia and polyuria.  Genitourinary: Negative for dysuria, frequency, genital sores, vaginal bleeding and vaginal discharge.  Musculoskeletal: Negative for arthralgias, gait problem and joint swelling.  Skin: Negative for color change and rash.  Neurological: Negative for dizziness, tremors, light-headedness and headaches.  Hematological: Negative for adenopathy. Does not bruise/bleed easily.  Psychiatric/Behavioral: Positive for sleep disturbance. Negative for dysphoric mood. The patient has insomnia. The patient is not nervous/anxious.     Patient Active Problem List   Diagnosis Date Noted  . Vitamin D deficiency 03/30/2019  . Tinnitus aurium, bilateral 02/15/2019  . HSV-1 infection 02/15/2019  . Eustachian tube disorder, bilateral 02/15/2019  . Generalized anxiety disorder 09/23/2018  . Anemia 02/11/2018  . Arthritis of left hip 12/09/2016  .  Osteopenia determined by x-ray 12/09/2016  . Mixed hyperlipidemia 10/28/2016  . Neuropathic pain, leg, bilateral 07/14/2016  . Environmental and seasonal allergies 07/14/2016  . Essential hypertension 08/15/2014  . Irritable bowel  syndrome with constipation 06/27/2014  . Neuritis or radiculitis due to rupture of lumbar intervertebral disc 06/27/2014  . Beat, premature ventricular 06/27/2014  . Phlebectasia 06/27/2014    Allergies  Allergen Reactions  . Codeine Sulfate Itching  . Duloxetine Nausea Only    Past Surgical History:  Procedure Laterality Date  . ABDOMINAL HYSTERECTOMY    . COLONOSCOPY    . COLONOSCOPY WITH PROPOFOL N/A 01/11/2016   Procedure: COLONOSCOPY WITH PROPOFOL;  Surgeon: Hulen Luster, MD;  Location: [facility] ENDOSCOPY;  Service: Gastroenterology;  Laterality: N/A;  . ESOPHAGOGASTRODUODENOSCOPY  2013   normal  . ESOPHAGOGASTRODUODENOSCOPY (EGD) WITH PROPOFOL N/A 01/11/2016   Procedure: ESOPHAGOGASTRODUODENOSCOPY (EGD) WITH PROPOFOL;  Surgeon: Hulen Luster, MD;  Location: [facility] ENDOSCOPY;  Service: Gastroenterology;  Laterality: N/A;  . eye brow surgery    . HERNIA REPAIR     femoral  . HYSTEROSCOPY WITH D & C N/A 05/07/2015   Procedure: DILATATION AND CURETTAGE /HYSTEROSCOPY;  Surgeon: Benjaman Kindler, MD;  Location: [facility] ORS;  Service: Gynecology;  Laterality: N/A;  . LAPAROSCOPIC TOTAL HYSTERECTOMY    . leg vein    . TONSILLECTOMY    . VARICOSE VEIN SURGERY     laser    Social History   Tobacco Use  . Smoking status: Never Smoker  . Smokeless tobacco: Never Used  Substance Use Topics  . Alcohol use: No    Alcohol/week: 0.0 standard drinks  . Drug use: No     Medication list has been reviewed and updated.  Current Meds  Medication Sig  . ALPRAZolam (XANAX) 0.5 MG tablet TAKE 1 TABLET BY MOUTH AT BEDTIME  . estradiol (CLIMARA - DOSED IN MG/24 HR) 0.05 mg/24hr patch PLACE 1 PATCH ONTO THE SKIN ONCE A WEEK **PREFERS SANDOZ [phone]**  . fluticasone (FLONASE) 50 MCG/ACT nasal spray Place 2 sprays into both nostrils daily as needed for allergies or rhinitis.  Marland Kitchen ibuprofen (ADVIL,MOTRIN) 200 MG tablet Take 600 mg by mouth every 6 (six) hours as needed.  . metoprolol succinate  (TOPROL-XL) 25 MG 24 hr tablet Take 1 tablet (25 mg total) by mouth 2 (two) times a day. Take with or immediately following a meal.  . Omega-3 Fatty Acids (FISH OIL) 1000 MG CAPS Take by mouth daily.     PHQ 2/9 Scores 03/29/2020 05/25/2019 03/29/2019 02/15/2019  PHQ - 2 Score 0 0 0 1  PHQ- 9 Score 0 0 4 1    BP Readings from Last 3 Encounters:  03/29/20 124/66  05/25/19 130/76  03/29/19 104/64    Physical Exam Vitals and nursing note reviewed.  Constitutional:      General: She is not in acute distress.    Appearance: She is well-developed.  HENT:     Head: Normocephalic and atraumatic.     Right Ear: Tympanic membrane and ear canal normal.     Left Ear: Tympanic membrane and ear canal normal.     Nose:     Right Sinus: No maxillary sinus tenderness.     Left Sinus: No maxillary sinus tenderness.  Eyes:     General: No scleral icterus.       Right eye: No discharge.        Left eye: No discharge.     Conjunctiva/sclera: Conjunctivae normal.  Neck:  Thyroid: No thyromegaly.     Vascular: No carotid bruit.  Cardiovascular:     Rate and Rhythm: Normal rate and regular rhythm.     Pulses: Normal pulses.     Heart sounds: Normal heart sounds.  Pulmonary:     Effort: Pulmonary effort is normal. No respiratory distress.     Breath sounds: No wheezing.  Chest:     Breasts:        Right: No mass, nipple discharge, skin change or tenderness.        Left: No mass, nipple discharge, skin change or tenderness.  Abdominal:     General: Abdomen is flat. Bowel sounds are normal.     Palpations: Abdomen is soft.     Tenderness: There is no abdominal tenderness.  Musculoskeletal:        General: Normal range of motion.     Cervical back: Normal range of motion. No erythema.     Right lower leg: No edema.     Left lower leg: No edema.  Lymphadenopathy:     Cervical: No cervical adenopathy.  Skin:    General: Skin is warm and dry.     Capillary Refill: Capillary refill takes  less than 2 seconds.     Findings: No rash.  Neurological:     General: No focal deficit present.     Mental Status: She is alert and oriented to person, place, and time.     Cranial Nerves: No cranial nerve deficit.     Sensory: No sensory deficit.     Deep Tendon Reflexes: Reflexes are normal and symmetric.  Psychiatric:        Mood and Affect: Mood normal.        Speech: Speech normal.     Wt Readings from Last 3 Encounters:  03/29/20 128 lb (58.1 kg)  05/25/19 124 lb (56.2 kg)  03/29/19 124 lb (56.2 kg)    BP 124/66   Pulse 78   Temp 97.8 F (36.6 C) (Temporal)   Ht 5\' 2"  (1.575 m)   Wt 128 lb (58.1 kg)   LMP 04/05/2014   SpO2 98%   BMI 23.41 kg/m   Assessment and Plan: 1. Annual physical exam Normal exam Continue healthy diet, regular exercise - POCT urinalysis dipstick  2. Encounter for screening mammogram for breast cancer Pt wants to postpone for now No breast concerns and no worrisome findings on breast exam  3. Essential hypertension Clinically stable exam with well controlled BP on metoprolol. Tolerating medications without side effects at this time. Pt to continue current regimen and low sodium diet; benefits of regular exercise as able discussed. - CBC with Differential/Platelet - Comprehensive metabolic panel - TSH  4. Mixed hyperlipidemia Check labs - Lipid panel  5. Vitamin D deficiency Continue supplementation - VITAMIN D 25 Hydroxy (Vit-D Deficiency, Fractures)  6. Primary insomnia Symptoms are well controlled on low dose xanax nightly No evidence of misuse or abuse  7. Chronic idiopathic constipation Needs to reschedule the colonoscopy Samples of Linzess given - linaclotide (LINZESS) 290 MCG CAPS capsule; Take 1 capsule (290 mcg total) by mouth daily before breakfast.  Dispense: 30 capsule; Refill: 0  8. Menopausal and female climacteric states Doing well on HRT patches  9. Need for shingles vaccine - Zoster Vaccine Adjuvanted  [facility]) injection; Inject 0.5 mLs into the muscle once for 1 dose.  Dispense: 0.5 mL; Refill: 1  10. Neuropathic pain, leg, bilateral - traMADol (ULTRAM) 50 MG tablet; Take  1 tablet (50 mg total) by mouth every 6 (six) hours as needed.  Dispense: 15 tablet; Refill: 0   Partially dictated using Editor, commissioning. Any errors are unintentional.  Halina Maidens, MD [facility]  03/29/2020

## 2020-03-29 NOTE — Patient Instructions (Signed)
Linzess - one tablet daily before breakfast

## 2020-03-30 LAB — LIPID PANEL
Chol/HDL Ratio: 4.8 ratio — ABNORMAL HIGH (ref 0.0–4.4)
Cholesterol, Total: 281 mg/dL — ABNORMAL HIGH (ref 100–199)
HDL: 58 mg/dL (ref >39)
LDL Chol Calc (NIH): 197 mg/dL — ABNORMAL HIGH (ref 0–99)
Triglycerides: 143 mg/dL (ref 0–149)
VLDL Cholesterol Cal: 26 mg/dL (ref 5–40)

## 2020-03-30 LAB — CBC WITH DIFFERENTIAL/PLATELET
Basophils Absolute: 0 10*3/uL (ref 0.0–0.2)
Basos: 1 %
EOS (ABSOLUTE): 0.2 10*3/uL (ref 0.0–0.4)
Eos: 5 %
Hematocrit: 39.2 % (ref 34.0–46.6)
Hemoglobin: 13.5 g/dL (ref 11.1–15.9)
Immature Grans (Abs): 0 10*3/uL (ref 0.0–0.1)
Immature Granulocytes: 0 %
Lymphocytes Absolute: 1.3 10*3/uL (ref 0.7–3.1)
Lymphs: 26 %
MCH: 31.8 pg (ref 26.6–33.0)
MCHC: 34.4 g/dL (ref 31.5–35.7)
MCV: 92 fL (ref 79–97)
Monocytes Absolute: 0.3 10*3/uL (ref 0.1–0.9)
Monocytes: 6 %
Neutrophils Absolute: 3.1 10*3/uL (ref 1.4–7.0)
Neutrophils: 62 %
Platelets: 234 10*3/uL (ref 150–450)
RBC: 4.25 x10E6/uL (ref 3.77–5.28)
RDW: 12.3 % (ref 11.7–15.4)
WBC: 5 10*3/uL (ref 3.4–10.8)

## 2020-03-30 LAB — COMPREHENSIVE METABOLIC PANEL
ALT: 34 IU/L — ABNORMAL HIGH (ref 0–32)
AST: 25 IU/L (ref 0–40)
Albumin/Globulin Ratio: 2.1 (ref 1.2–2.2)
Albumin: 4.6 g/dL (ref 3.8–4.9)
Alkaline Phosphatase: 56 IU/L (ref 39–117)
BUN/Creatinine Ratio: 14 (ref 9–23)
BUN: 9 mg/dL (ref 6–24)
Bilirubin Total: 0.6 mg/dL (ref 0.0–1.2)
CO2: 25 mmol/L (ref 20–29)
Calcium: 9.4 mg/dL (ref 8.7–10.2)
Chloride: 104 mmol/L (ref 96–106)
Creatinine, Ser: 0.63 mg/dL (ref 0.57–1.00)
GFR calc Af Amer: 114 mL/min/{1.73_m2} (ref >59)
GFR calc non Af Amer: 98 mL/min/{1.73_m2} (ref >59)
Globulin, Total: 2.2 g/dL (ref 1.5–4.5)
Glucose: 88 mg/dL (ref 65–99)
Potassium: 4.4 mmol/L (ref 3.5–5.2)
Sodium: 143 mmol/L (ref 134–144)
Total Protein: 6.8 g/dL (ref 6.0–8.5)

## 2020-03-30 LAB — VITAMIN D 25 HYDROXY (VIT D DEFICIENCY, FRACTURES): Vit D, 25-Hydroxy: 29.7 ng/mL — ABNORMAL LOW (ref 30.0–100.0)

## 2020-03-30 LAB — TSH: TSH: 0.883 u[IU]/mL (ref 0.450–4.500)

## 2020-05-11 DIAGNOSIS — N3090 Cystitis, unspecified without hematuria: Secondary | ICD-10-CM | POA: Insufficient documentation

## 2020-07-06 ENCOUNTER — Telehealth: Payer: Self-pay | Admitting: Internal Medicine

## 2020-07-06 NOTE — Telephone Encounter (Signed)
Called pt could not leave VM phone was busy.  KP

## 2020-07-06 NOTE — Telephone Encounter (Signed)
Copied from [place] [phone]. Topic: General - Inquiry >> Jul 06, 2020  1:56 PM Gillis Ends D wrote: Reason for CRM: Patient request a call back from the nurse.

## 2020-07-31 ENCOUNTER — Other Ambulatory Visit: Payer: Self-pay

## 2020-07-31 ENCOUNTER — Ambulatory Visit: Payer: No Typology Code available for payment source | Admitting: Dermatology

## 2020-07-31 ENCOUNTER — Encounter: Payer: Self-pay | Admitting: Dermatology

## 2020-07-31 DIAGNOSIS — L988 Other specified disorders of the skin and subcutaneous tissue: Secondary | ICD-10-CM

## 2020-07-31 DIAGNOSIS — L719 Rosacea, unspecified: Secondary | ICD-10-CM

## 2020-07-31 NOTE — Progress Notes (Signed)
    Follow-Up Visit   Subjective  Carla Green is a 59 y.o. female who presents for the following: Facial Elastosis (patient is here today for Botox injections).  The following portions of the chart were reviewed this encounter and updated as appropriate:  Tobacco  Allergies  Meds  Problems  Med Hx  Surg Hx  Fam Hx     Review of Systems:  No other skin or systemic complaints except as noted in HPI or Assessment and Plan.  Objective  Well appearing patient in no apparent distress; mood and affect are within normal limits.  A focused examination was performed including the face. Relevant physical exam findings are noted in the Assessment and Plan.  Objective  Face: Rhytides and volume loss.   Images                Objective  Head - Anterior (Face): Mid face erythema with telangiectasias +/- scattered inflammatory papules.   Assessment & Plan  Elastosis of skin Face  Botox 31.5 units injected as marked: - Frown complex 20 units - Forehead 7.5 units  - L DAO 4 units   Patient interested in lifting her brows. Advised her that we should wait at least three weeks for Botox to take effect so we can see the total results before adding more.   Botox Injection - Face Location: See attached image  Informed consent: Discussed risks (infection, pain, bleeding, bruising, swelling, allergic reaction, paralysis of nearby muscles, eyelid droop, double vision, neck weakness, difficulty breathing, headache, undesirable cosmetic result, and need for additional treatment) and benefits of the procedure, as well as the alternatives.  Informed consent was obtained.  Preparation: The area was cleansed with alcohol.  Procedure Details:  Botox was injected into the dermis with a 30-gauge needle. Pressure applied to any bleeding. Ice packs offered for swelling.  Lot Number:  [id number] Expiration:  09/2022  Total Units Injected:  31.5  Plan: Patient was instructed to remain  upright for 4 hours. Patient was instructed to avoid massaging the face and avoid vigorous exercise for the rest of the day. Tylenol may be used for headache.  Allow 2 weeks before returning to clinic for additional dosing as needed. Patient will call for any problems.   Rosacea Head - Anterior (Face)  Discussed and recommend BBL (patient has had in the past). Advised patient that BBL is a cosmetic procedure which is not covered by health insurance.   Return in about 4 months (around 11/30/2020) for cosmetic - Botox; or Botox in 3 weeks for brow lifts.  Luther Redo, CMA, am acting as scribe for Sarina Ser, MD .  Documentation: I have reviewed the above documentation for accuracy and completeness, and I agree with the above.  Sarina Ser, MD

## 2020-08-21 ENCOUNTER — Other Ambulatory Visit: Payer: Self-pay | Admitting: Internal Medicine

## 2020-08-21 DIAGNOSIS — F419 Anxiety disorder, unspecified: Secondary | ICD-10-CM

## 2020-08-21 NOTE — Telephone Encounter (Signed)
Requested medication (s) are due for refill today: yes  Requested medication (s) are on the active medication list: yes  Last refill:  05/28/20  Future visit scheduled: yes  Notes to clinic:  not delegted    Requested Prescriptions  Pending Prescriptions Disp Refills   ALPRAZolam (XANAX) 0.5 MG tablet [Pharmacy Med Name: ALPRAZolam 0.5 MG TABS 0.5 Tablet] 90 tablet 1    Sig: TAKE 1 TABLET BY MOUTH AT BEDTIME      Not Delegated - Psychiatry:  Anxiolytics/Hypnotics Failed - 08/21/2020  9:06 AM      Failed - This refill cannot be delegated      Failed - Urine Drug Screen completed in last 360 days.      Passed - Valid encounter within last 6 months    Recent Outpatient Visits           4 months ago Annual physical exam   [facility] Glean Hess, MD   1 year ago [facility] Glean Hess, MD   1 year ago Annual physical exam   [facility] Glean Hess, MD   1 year ago Eustachian tube disorder, bilateral   [facility] Glean Hess, MD   1 year ago Acute otitis media, unspecified otitis media type   [facility] Glean Hess, MD       Future Appointments             In 7 months Army Melia Jesse Sans, MD [facility], [facility]

## 2020-10-08 ENCOUNTER — Other Ambulatory Visit: Payer: Self-pay | Admitting: Internal Medicine

## 2020-12-11 ENCOUNTER — Other Ambulatory Visit: Payer: Self-pay | Admitting: Internal Medicine

## 2020-12-11 DIAGNOSIS — K5904 Chronic idiopathic constipation: Secondary | ICD-10-CM

## 2020-12-11 MED ORDER — LINACLOTIDE 290 MCG PO CAPS: 290.0000 ug | ORAL_CAPSULE | Freq: Every day | ORAL | 0 refills | Status: DC

## 2020-12-11 NOTE — Telephone Encounter (Signed)
Medication Refill - Medication: linaclotide (LINZESS) 290 MCG CAPS capsule     Preferred Pharmacy (with phone number or street name):  [facility], [address] Phone:  [phone]  Fax:  [phone]       Agent: Please be advised that RX refills may take up to 3 business days. We ask that you follow-up with your pharmacy.

## 2020-12-25 ENCOUNTER — Ambulatory Visit (INDEPENDENT_AMBULATORY_CARE_PROVIDER_SITE_OTHER): Payer: Self-pay | Admitting: Dermatology

## 2020-12-25 ENCOUNTER — Other Ambulatory Visit: Payer: Self-pay

## 2020-12-25 DIAGNOSIS — L988 Other specified disorders of the skin and subcutaneous tissue: Secondary | ICD-10-CM

## 2020-12-25 NOTE — Progress Notes (Signed)
   Follow-Up Visit   Subjective  Carla Green is a 60 y.o. female who presents for the following: Facial Elastosis (Face, pt presents for Botox today, last treatment 07/31/20).  The following portions of the chart were reviewed this encounter and updated as appropriate:   Tobacco  Allergies  Meds  Problems  Med Hx  Surg Hx  Fam Hx     Review of Systems:  No other skin or systemic complaints except as noted in HPI or Assessment and Plan.  Objective  Well appearing patient in no apparent distress; mood and affect are within normal limits.  A focused examination was performed including face. Relevant physical exam findings are noted in the Assessment and Plan.  Objective  Head - Anterior (Face): Rhytides and volume loss.   Images     Assessment & Plan  Elastosis of skin Head - Anterior (Face)  Botox 20 units (charged for 19)injected today to: - Frown complex 10 units - Forehead 6 units (charged for 5) - L DAO 4 units  Botox Injection - Head - Anterior (Face) Location: Frown complex, forehead, L DAO  Informed consent: Discussed risks (infection, pain, bleeding, bruising, swelling, allergic reaction, paralysis of nearby muscles, eyelid droop, double vision, neck weakness, difficulty breathing, headache, undesirable cosmetic result, and need for additional treatment) and benefits of the procedure, as well as the alternatives.  Informed consent was obtained.  Preparation: The area was cleansed with alcohol.  Procedure Details:  Botox was injected into the dermis with a 30-gauge needle. Pressure applied to any bleeding. Ice packs offered for swelling.  Lot Number:  [id number] Expiration:  01/2023  Total Units Injected:  20  Plan: Patient was instructed to remain upright for 4 hours. Patient was instructed to avoid massaging the face and avoid vigorous exercise for the rest of the day. Tylenol may be used for headache.  Allow 2 weeks before returning to clinic for  additional dosing as needed. Patient will call for any problems.   Return in about 5 months (around 05/24/2021) for Botox.  I, Othelia Pulling, RMA, am acting as scribe for Sarina Ser, MD .  Documentation: I have reviewed the above documentation for accuracy and completeness, and I agree with the above.  Sarina Ser, MD

## 2020-12-29 ENCOUNTER — Encounter: Payer: Self-pay | Admitting: Dermatology

## 2021-02-24 ENCOUNTER — Other Ambulatory Visit: Payer: Self-pay | Admitting: Internal Medicine

## 2021-02-25 ENCOUNTER — Other Ambulatory Visit: Payer: Self-pay | Admitting: Internal Medicine

## 2021-02-25 ENCOUNTER — Other Ambulatory Visit: Payer: Self-pay

## 2021-02-25 DIAGNOSIS — N951 Menopausal and female climacteric states: Secondary | ICD-10-CM

## 2021-02-25 MED ORDER — ESTRADIOL 0.1 MG/24HR TD PTTW
1.0000 | MEDICATED_PATCH | TRANSDERMAL | 7 refills | Status: DC
  Filled 2021-02-25: qty 8, 56d supply, fill #0

## 2021-02-25 NOTE — Telephone Encounter (Signed)
Requested medication (s) are due for refill today: Yes  Requested medication (s) are on the active medication list: Yes  Last refill:  02/11/19  Future visit scheduled: Yes  Notes to clinic:  Prescription has expired.    Requested Prescriptions  Pending Prescriptions Disp Refills   estradiol (CLIMARA - DOSED IN MG/24 HR) 0.05 mg/24hr patch 8 patch 5    Sig: PLACE 1 PATCH ONTO THE SKIN ONCE A WEEK **PREFERS Delana Meyer [phone]**      OB/GYN:  Estrogens Failed - 02/25/2021  2:06 PM      Failed - Mammogram is up-to-date per Health Maintenance      Passed - Last BP in normal range    BP Readings from Last 1 Encounters:  03/29/20 124/66          Passed - Valid encounter within last 12 months    Recent Outpatient Visits           11 months ago Annual physical exam   [facility] Glean Hess, MD   1 year ago [facility] Glean Hess, MD   1 year ago Annual physical exam   [facility] Glean Hess, MD   2 years ago Eustachian tube disorder, bilateral   [facility] Glean Hess, MD   2 years ago Acute otitis media, unspecified otitis media type   [facility] Glean Hess, MD       Future Appointments             In 1 month Army Melia, Jesse Sans, MD [facility], [facility]

## 2021-02-25 NOTE — Telephone Encounter (Signed)
Requested medication (s) are due for refill today: no  Requested medication (s) are on the active medication list: yes  Last refill:  12/31/2020  Future visit scheduled: no  Notes to clinic:  due for appointment and mammogram    Requested Prescriptions  Pending Prescriptions Disp Refills   estradiol (CLIMARA - DOSED IN MG/24 HR) 0.05 mg/24hr patch 8 patch 5    Sig: PLACE 1 PATCH ONTO THE SKIN ONCE A WEEK **PREFERS Carla Green [phone]**      OB/GYN:  Estrogens Failed - 02/24/2021  9:08 PM      Failed - Mammogram is up-to-date per Health Maintenance      Failed - Last BP in normal range    BP Readings from Last 1 Encounters:  No data found for BP          Failed - Valid encounter within last 12 months    Recent Outpatient Visits   None

## 2021-02-26 ENCOUNTER — Other Ambulatory Visit: Payer: Self-pay

## 2021-04-01 ENCOUNTER — Encounter: Payer: BLUE CROSS/BLUE SHIELD | Admitting: Internal Medicine

## 2021-04-03 ENCOUNTER — Encounter: Payer: BLUE CROSS/BLUE SHIELD | Admitting: Internal Medicine

## 2021-06-11 ENCOUNTER — Other Ambulatory Visit: Payer: Self-pay

## 2021-06-11 ENCOUNTER — Ambulatory Visit (INDEPENDENT_AMBULATORY_CARE_PROVIDER_SITE_OTHER): Payer: Self-pay | Admitting: Dermatology

## 2021-06-11 DIAGNOSIS — L988 Other specified disorders of the skin and subcutaneous tissue: Secondary | ICD-10-CM

## 2021-06-11 DIAGNOSIS — L719 Rosacea, unspecified: Secondary | ICD-10-CM

## 2021-06-11 NOTE — Progress Notes (Signed)
   Follow-Up Visit   Subjective  Carla Green is a 60 y.o. female who presents for the following: Facial Elastosis (Botox today).  The following portions of the chart were reviewed this encounter and updated as appropriate:   Tobacco  Allergies  Meds  Problems  Med Hx  Surg Hx  Fam Hx     Review of Systems:  No other skin or systemic complaints except as noted in HPI or Assessment and Plan.  Objective  Well appearing patient in no apparent distress; mood and affect are within normal limits.  A focused examination was performed including face. Relevant physical exam findings are noted in the Assessment and Plan.  Head - Anterior (Face) Rhytides and volume loss.      Head - Anterior (Face) Pinkness and dilated blood vessels   Assessment & Plan  Elastosis of skin Head - Anterior (Face)  Botox today - 30 units today  Frown Complex - 10 units Forehead - 6 units Crow's Feet - 5 units each side Left DAO - 4 units  Filling material injection - Head - Anterior (Face) Location: See attached image  Informed consent: Discussed risks (infection, pain, bleeding, bruising, swelling, allergic reaction, paralysis of nearby muscles, eyelid droop, double vision, neck weakness, difficulty breathing, headache, undesirable cosmetic result, and need for additional treatment) and benefits of the procedure, as well as the alternatives.  Informed consent was obtained.  Preparation: The area was cleansed with alcohol.  Procedure Details:  Botox was injected into the dermis with a 30-gauge needle. Pressure applied to any bleeding. Ice packs offered for swelling.  Lot Number:  [id number] Expiration:  01/2023  Total Units Injected:  30   Plan: Patient was instructed to remain upright for 4 hours. Patient was instructed to avoid massaging the face and avoid vigorous exercise for the rest of the day. Tylenol may be used for headache.  Allow 2 weeks before returning to clinic for additional  dosing as needed. Patient will call for any problems.   Rosacea Head - Anterior (Face)  Discussed BBL/laser treatments.  Return for Botox in 3-4 months.  I, Ashok Cordia, CMA, am acting as scribe for Sarina Ser, MD .  Documentation: I have reviewed the above documentation for accuracy and completeness, and I agree with the above.  Sarina Ser, MD

## 2021-06-11 NOTE — Patient Instructions (Signed)

## 2021-06-12 ENCOUNTER — Encounter: Payer: Self-pay | Admitting: Dermatology

## 2021-07-01 ENCOUNTER — Telehealth: Payer: Self-pay

## 2021-07-01 NOTE — Telephone Encounter (Signed)
Called pt to schedule an appt to be seen. VM was not set up could not leave message Pt has not been seen since 03/2020. Pt needs a CPE and also an appt to discuss xray that was done at [facility]  PEC nurse may give results to patient if they return call to clinic, a CRM has been created.  KP

## 2021-07-12 NOTE — Progress Notes (Signed)
Called pt could not leave VM. VM was not set up.  PEC nurse may give results to patient if they return call to clinic, a CRM has been created.  KP

## 2021-07-15 ENCOUNTER — Telehealth: Payer: Self-pay

## 2021-07-15 NOTE — Telephone Encounter (Signed)
Called pt could not leave VM. Pt needs and appt for CPE and to discuss xray result. Pt has not been seen since 03/2020. Please give pt DG results. A CRM is created.  PEC nurse may give results to patient if they return call to clinic, a CRM has been created.  KP

## 2021-07-15 NOTE — Telephone Encounter (Signed)
Noted  KP 

## 2021-07-15 NOTE — Telephone Encounter (Signed)
Pt has actually moved and also has new insurance that pt stated is not accepted at the office, pt has bcbs healthy blue now.

## 2021-07-15 NOTE — Telephone Encounter (Signed)
Copied from [place] [phone]. Topic: General - Other >> Jul 12, 2021  4:12 PM Leward Quan A wrote: Reason for CRM: Patient called in to say that she had a missed call from the office but nothing noted Please advise

## 2021-10-22 ENCOUNTER — Ambulatory Visit: Payer: Self-pay | Admitting: Dermatology

## 2021-12-25 ENCOUNTER — Other Ambulatory Visit: Payer: Self-pay

## 2021-12-25 ENCOUNTER — Encounter: Payer: Self-pay | Admitting: Dermatology

## 2021-12-25 ENCOUNTER — Ambulatory Visit (INDEPENDENT_AMBULATORY_CARE_PROVIDER_SITE_OTHER): Payer: Self-pay | Admitting: Dermatology

## 2021-12-25 DIAGNOSIS — L988 Other specified disorders of the skin and subcutaneous tissue: Secondary | ICD-10-CM

## 2021-12-25 NOTE — Patient Instructions (Signed)

## 2021-12-25 NOTE — Progress Notes (Signed)
° °  Follow-Up Visit   Subjective  NAIOMI Green is a 61 y.o. female who presents for the following: Facial Elastosis (Here for Botox).  The following portions of the chart were reviewed this encounter and updated as appropriate:  Tobacco   Allergies   Meds   Problems   Med Hx   Surg Hx   Fam Hx       Review of Systems: No other skin or systemic complaints except as noted in HPI or Assessment and Plan.   Objective  Well appearing patient in no apparent distress; mood and affect are within normal limits.  A focused examination was performed including face. Relevant physical exam findings are noted in the Assessment and Plan.  face Rhytides and volume loss.           Assessment & Plan  Elastosis of skin face  Botox today - 29 units today injected as noted on diagram.   Frown Complex - 10 units Forehead - 6 units Crow's Feet - 5 units each side Left DAO - 2 units Right upper cutaneous lip - 1 unit  Botox Injection - face Location: See attached image  Informed consent: Discussed risks (infection, pain, bleeding, bruising, swelling, allergic reaction, paralysis of nearby muscles, eyelid droop, double vision, neck weakness, difficulty breathing, headache, undesirable cosmetic result, and need for additional treatment) and benefits of the procedure, as well as the alternatives.  Informed consent was obtained.  Preparation: The area was cleansed with alcohol.  Procedure Details:  Botox was injected into the dermis with a 30-gauge needle. Pressure applied to any bleeding. Ice packs offered for swelling.  Lot Number:  [id number] Expiration:  01/2024  Total Units Injected:  29  Plan: Patient was instructed to remain upright for 4 hours. Patient was instructed to avoid massaging the face and avoid vigorous exercise for the rest of the day. Tylenol may be used for headache.  Allow 2 weeks before returning to clinic for additional dosing as needed. Patient will call for any  problems.    Return for Fillers (perioral), Botox 3-4 months.  I, Carla Green, CMA, am acting as scribe for Forest Gleason, MD.  Documentation: I have reviewed the above documentation for accuracy and completeness, and I agree with the above.  Forest Gleason, MD

## 2022-01-01 ENCOUNTER — Encounter: Payer: Self-pay | Admitting: Dermatology

## 2022-04-04 ENCOUNTER — Other Ambulatory Visit: Payer: Self-pay | Admitting: Internal Medicine

## 2022-04-04 NOTE — Telephone Encounter (Signed)
Requested medications are due for refill today.  yes  Requested medications are on the active medications list.  yes  Last refill. 02/25/2021 #8 5 refills  Future visit scheduled.   no  Notes to clinic.  Pt is more than 3 months overdue for ov. Pt last seen 03/29/2020.    Requested Prescriptions  Pending Prescriptions Disp Refills   estradiol (CLIMARA - DOSED IN MG/24 HR) 0.05 mg/24hr patch [Pharmacy Med Name: Estradiol Transdermal Patch Weekly 0.05 MG/24HR] 8 patch 0    Sig: PLACE 1 PATCH ONTO THE SKIN ONCE A WEEK     OB/GYN:  Estrogens Failed - 04/04/2022 10:29 AM      Failed - Mammogram is up-to-date per Health Maintenance      Failed - Valid encounter within last 12 months    Recent Outpatient Visits           2 years ago Annual physical exam   [facility] Glean Hess, MD   2 years ago [facility] Glean Hess, MD   3 years ago Annual physical exam   [facility] Glean Hess, MD   3 years ago Eustachian tube disorder, bilateral   [facility] Glean Hess, MD   3 years ago Acute otitis media, unspecified otitis media type   [facility] Glean Hess, MD               Passed - Last BP in normal range    BP Readings from Last 1 Encounters:  03/29/20 124/66

## 2022-04-25 DIAGNOSIS — E559 Vitamin D deficiency, unspecified: Secondary | ICD-10-CM | POA: Diagnosis not present

## 2022-04-25 DIAGNOSIS — I10 Essential (primary) hypertension: Secondary | ICD-10-CM | POA: Diagnosis not present

## 2022-04-25 DIAGNOSIS — E7849 Other hyperlipidemia: Secondary | ICD-10-CM | POA: Diagnosis not present

## 2022-04-25 DIAGNOSIS — Z0000 Encounter for general adult medical examination without abnormal findings: Secondary | ICD-10-CM | POA: Diagnosis not present

## 2022-04-30 ENCOUNTER — Ambulatory Visit: Payer: Self-pay | Admitting: Dermatology

## 2022-04-30 DIAGNOSIS — L719 Rosacea, unspecified: Secondary | ICD-10-CM

## 2022-04-30 DIAGNOSIS — L988 Other specified disorders of the skin and subcutaneous tissue: Secondary | ICD-10-CM

## 2022-04-30 NOTE — Progress Notes (Signed)
   Follow-Up Visit   Subjective  Carla Green is a 61 y.o. female who presents for the following: Facial Elastosis (Face, pt presents for Botox). Pt also complains of red face and wonders about treatment options.  The following portions of the chart were reviewed this encounter and updated as appropriate:   Tobacco  Allergies  Meds  Problems  Med Hx  Surg Hx  Fam Hx     Review of Systems:  No other skin or systemic complaints except as noted in HPI or Assessment and Plan.  Objective  Well appearing patient in no apparent distress; mood and affect are within normal limits.  A focused examination was performed including face. Relevant physical exam findings are noted in the Assessment and Plan.  Head - Anterior (Face) Rhytides and volume loss.      face Erythema face   Assessment & Plan  Elastosis of skin Head - Anterior (Face)  Botox 19 units injected today to: - Frown Complex - 10 units - Forehead - 6 units - Left DAO - 2 units - Right upper cutaneous lip - 1 unit  Pt decided not to do crow's feet today.  Botox Injection - Head - Anterior (Face) Location: Frown complex, forehead, L DAO, R upper cutaneous lip  Informed consent: Discussed risks (infection, pain, bleeding, bruising, swelling, allergic reaction, paralysis of nearby muscles, eyelid droop, double vision, neck weakness, difficulty breathing, headache, undesirable cosmetic result, and need for additional treatment) and benefits of the procedure, as well as the alternatives.  Informed consent was obtained.  Preparation: The area was cleansed with alcohol.  Procedure Details:  Botox was injected into the dermis with a 30-gauge needle. Pressure applied to any bleeding. Ice packs offered for swelling.  Lot Number:  [id number] Expiration:  02/2024  Total Units Injected:  19  Plan: Patient was instructed to remain upright for 4 hours. Patient was instructed to avoid massaging the face and avoid vigorous  exercise for the rest of the day. Tylenol may be used for headache.  Allow 2 weeks before returning to clinic for additional dosing as needed. Patient will call for any problems.  Rosacea face Rosacea is a chronic progressive skin condition usually affecting the face of adults, causing redness and/or acne bumps. It is treatable but not curable. It sometimes affects the eyes (ocular rosacea) as well. It may respond to topical and/or systemic medication and can flare with stress, sun exposure, alcohol, exercise and some foods.  Daily application of broad spectrum spf 30+ sunscreen to face is recommended to reduce flares.  Discussed the treatment option of BBL/laser.  Typically we recommend 1-3 treatment sessions about 5-8 weeks apart for best results.  The patient's condition may require "maintenance treatments" in the future.  The fee for BBL / laser treatments is $350 per treatment session for the whole face.  A fee can be quoted for other parts of the body. Insurance typically does not pay for BBL/laser treatments and therefore the fee is an out-of-pocket cost.  Start [facility] Rosacea-Oxymetazoline qam to face  Return for 3-55mBotox.  I, SOthelia Pulling RMA, am acting as scribe for DSarina Ser MD . Documentation: I have reviewed the above documentation for accuracy and completeness, and I agree with the above.  DSarina Ser MD

## 2022-04-30 NOTE — Patient Instructions (Addendum)
Instructions for [facility] Medications  One or more of your medications was sent to the [facility] mail order compounding pharmacy. You will receive an email from them and can purchase the medicine through that link. It will then be mailed to your home at the address you confirmed. If for any reason you do not receive an email from them, please check your spam folder. If you still do not find the email, please let us know. [facility] phone number is [phone].       Due to recent changes in healthcare laws, you may see results of your pathology and/or laboratory studies on MyChart before the doctors have had a chance to review them. We understand that in some cases there may be results that are confusing or concerning to you. Please understand that not all results are received at the same time and often the doctors may need to interpret multiple results in order to provide you with the best plan of care or course of treatment. Therefore, we ask that you please give us 2 business days to thoroughly review all your results before contacting the office for clarification. Should we see a critical lab result, you will be contacted sooner.   If You Need Anything After Your Visit  If you have any questions or concerns for your doctor, please call our main line at [phone] and press option 4 to reach your doctor's medical assistant. If no one answers, please leave a voicemail as directed and we will return your call as soon as possible. Messages left after 4 pm will be answered the following business day.   You may also send us a message via MyChart. We typically respond to MyChart messages within 1-2 business days.  For prescription refills, please ask your pharmacy to contact our office. Our fax number is [phone].  If you have an urgent issue when the clinic is closed that cannot wait until the next business day, you can page your doctor at the number below.    Please note  that while we do our best to be available for urgent issues outside of office hours, we are not available 24/7.   If you have an urgent issue and are unable to reach us, you may choose to seek medical care at your doctor's office, retail clinic, urgent care center, or emergency room.  If you have a medical emergency, please immediately call 911 or go to the emergency department.  Pager Numbers  - Dr. Kowalski: [phone]  - Dr. Moye: [phone]  - Dr. Stewart: [phone]  In the event of inclement weather, please call our main line at [phone] for an update on the status of any delays or closures.  Dermatology Medication Tips: Please keep the boxes that topical medications come in in order to help keep track of the instructions about where and how to use these. Pharmacies typically print the medication instructions only on the boxes and not directly on the medication tubes.   If your medication is too expensive, please contact our office at [phone] option 4 or send us a message through MyChart.   We are unable to tell what your co-pay for medications will be in advance as this is different depending on your insurance coverage. However, we may be able to find a substitute medication at lower cost or fill out paperwork to get insurance to cover a needed medication.   If a prior authorization is required to get your medication covered by your insurance   company, please allow us 1-2 business days to complete this process.  Drug prices often vary depending on where the prescription is filled and some pharmacies may offer cheaper prices.  The website www.goodrx.com contains coupons for medications through different pharmacies. The prices here do not account for what the cost may be with help from insurance (it may be cheaper with your insurance), but the website can give you the price if you did not use any insurance.  - You can print the associated coupon and take it with your  prescription to the pharmacy.  - You may also stop by our office during regular business hours and pick up a GoodRx coupon card.  - If you need your prescription sent electronically to a different pharmacy, notify our office through [place] MyChart or by phone at [phone] option 4.     Si Usted Necesita Algo Despus de Su Visita  Tambin puede enviarnos un mensaje a travs de MyChart. Por lo general respondemos a los mensajes de MyChart en el transcurso de 1 a 2 das hbiles.  Para renovar recetas, por favor pida a su farmacia que se ponga en contacto con nuestra oficina. Nuestro nmero de fax es el [phone].  Si tiene un asunto urgente cuando la clnica est cerrada y que no puede esperar hasta el siguiente da hbil, puede llamar/localizar a su doctor(a) al nmero que aparece a continuacin.   Por favor, tenga en cuenta que aunque hacemos todo lo posible para estar disponibles para asuntos urgentes fuera del horario de oficina, no estamos disponibles las 24 horas del da, los 7 das de la semana.   Si tiene un problema urgente y no puede comunicarse con nosotros, puede optar por buscar atencin mdica  en el consultorio de su doctor(a), en una clnica privada, en un centro de atencin urgente o en una sala de emergencias.  Si tiene una emergencia mdica, por favor llame inmediatamente al 911 o vaya a la sala de emergencias.  Nmeros de bper  - Dr. Kowalski: [phone]  - Dra. Moye: [phone]  - Dra. Stewart: [phone]  En caso de inclemencias del tiempo, por favor llame a nuestra lnea principal al [phone] para una actualizacin sobre el estado de cualquier retraso o cierre.  Consejos para la medicacin en dermatologa: Por favor, guarde las cajas en las que vienen los medicamentos de uso tpico para ayudarle a seguir las instrucciones sobre dnde y cmo usarlos. Las farmacias generalmente imprimen las instrucciones del medicamento slo en las cajas y no  directamente en los tubos del medicamento.   Si su medicamento es muy caro, por favor, pngase en contacto con nuestra oficina llamando al [phone] y presione la opcin 4 o envenos un mensaje a travs de MyChart.   No podemos decirle cul ser su copago por los medicamentos por adelantado ya que esto es diferente dependiendo de la cobertura de su seguro. Sin embargo, es posible que podamos encontrar un medicamento sustituto a menor costo o llenar un formulario para que el seguro cubra el medicamento que se considera necesario.   Si se requiere una autorizacin previa para que su compaa de seguros cubra su medicamento, por favor permtanos de 1 a 2 das hbiles para completar este proceso.  Los precios de los medicamentos varan con frecuencia dependiendo del lugar de dnde se surte la receta y alguna farmacias pueden ofrecer precios ms baratos.  El sitio web www.goodrx.com tiene cupones para medicamentos de diferentes farmacias. Los precios aqu no tienen en cuenta   lo que podra costar con la ayuda del seguro (puede ser ms barato con su seguro), pero el sitio web puede darle el precio si no utiliz ningn seguro.  - Puede imprimir el cupn correspondiente y llevarlo con su receta a la farmacia.  - Tambin puede pasar por nuestra oficina durante el horario de atencin regular y recoger una tarjeta de cupones de GoodRx.  - Si necesita que su receta se enve electrnicamente a una farmacia diferente, informe a nuestra oficina a travs de MyChart de [place] o por telfono llamando al [phone] y presione la opcin 4.  

## 2022-05-02 ENCOUNTER — Encounter: Payer: Self-pay | Admitting: Dermatology

## 2022-08-13 DIAGNOSIS — Z800 Family history of malignant neoplasm of digestive organs: Secondary | ICD-10-CM | POA: Diagnosis not present

## 2022-08-13 DIAGNOSIS — K5732 Diverticulitis of large intestine without perforation or abscess without bleeding: Secondary | ICD-10-CM | POA: Diagnosis not present

## 2022-08-13 DIAGNOSIS — R194 Change in bowel habit: Secondary | ICD-10-CM | POA: Diagnosis not present

## 2022-08-28 DIAGNOSIS — H524 Presbyopia: Secondary | ICD-10-CM | POA: Diagnosis not present

## 2022-08-28 DIAGNOSIS — H5203 Hypermetropia, bilateral: Secondary | ICD-10-CM | POA: Diagnosis not present

## 2022-08-28 DIAGNOSIS — H2513 Age-related nuclear cataract, bilateral: Secondary | ICD-10-CM | POA: Diagnosis not present

## 2022-08-28 DIAGNOSIS — H52223 Regular astigmatism, bilateral: Secondary | ICD-10-CM | POA: Diagnosis not present

## 2022-10-15 ENCOUNTER — Ambulatory Visit: Payer: Self-pay | Admitting: Dermatology

## 2022-10-21 DIAGNOSIS — E782 Mixed hyperlipidemia: Secondary | ICD-10-CM | POA: Diagnosis not present

## 2022-10-21 LAB — LIPID PANEL
Cholesterol: 249 — AB (ref 0–200)
HDL: 53 (ref 35–70)
LDL Cholesterol: 172
LDl/HDL Ratio: 4.7
Triglycerides: 131 (ref 40–160)

## 2023-01-09 DIAGNOSIS — M438X6 Other specified deforming dorsopathies, lumbar region: Secondary | ICD-10-CM | POA: Diagnosis not present

## 2023-01-09 DIAGNOSIS — M5136 Other intervertebral disc degeneration, lumbar region: Secondary | ICD-10-CM | POA: Diagnosis not present

## 2023-01-09 DIAGNOSIS — M5416 Radiculopathy, lumbar region: Secondary | ICD-10-CM | POA: Diagnosis not present

## 2023-01-09 DIAGNOSIS — M533 Sacrococcygeal disorders, not elsewhere classified: Secondary | ICD-10-CM | POA: Diagnosis not present

## 2023-02-11 ENCOUNTER — Ambulatory Visit: Payer: Self-pay | Admitting: Dermatology

## 2023-03-23 ENCOUNTER — Ambulatory Visit: Payer: Self-pay | Admitting: Dermatology

## 2023-04-08 ENCOUNTER — Other Ambulatory Visit: Payer: Self-pay

## 2023-04-09 ENCOUNTER — Telehealth: Payer: Self-pay | Admitting: Cardiovascular Disease

## 2023-04-09 NOTE — Telephone Encounter (Signed)
*  STAT* If patient is at the pharmacy, call can be transferred to refill team.   1. Which medications need to be refilled? (please list name of each medication and dose if known) metoprolol succinate (TOPROL-XL) 25 MG 24 hr tablet   2. Which pharmacy/location (including street and city if local pharmacy) is medication to be sent to? [facility]   3. Do they need a 30 day or 90 day supply? 90

## 2023-04-10 NOTE — Telephone Encounter (Signed)
Spoke with patient and advised her to get refill through PCP. Patient last seen 01/2019 by Dr. Mariah Milling then transferred care to [facility]. Patient scheduled to see Dr. Mariah Milling as a new patient on 05/26/23.

## 2023-04-23 DIAGNOSIS — E782 Mixed hyperlipidemia: Secondary | ICD-10-CM | POA: Diagnosis not present

## 2023-04-23 DIAGNOSIS — E559 Vitamin D deficiency, unspecified: Secondary | ICD-10-CM | POA: Diagnosis not present

## 2023-04-23 DIAGNOSIS — M5416 Radiculopathy, lumbar region: Secondary | ICD-10-CM | POA: Diagnosis not present

## 2023-04-23 DIAGNOSIS — M5136 Other intervertebral disc degeneration, lumbar region: Secondary | ICD-10-CM | POA: Diagnosis not present

## 2023-04-23 DIAGNOSIS — Z0001 Encounter for general adult medical examination with abnormal findings: Secondary | ICD-10-CM | POA: Diagnosis not present

## 2023-04-23 DIAGNOSIS — M1612 Unilateral primary osteoarthritis, left hip: Secondary | ICD-10-CM | POA: Diagnosis not present

## 2023-04-23 DIAGNOSIS — I10 Essential (primary) hypertension: Secondary | ICD-10-CM | POA: Diagnosis not present

## 2023-04-23 LAB — CBC AND DIFFERENTIAL
HCT: 38 (ref 36–46)
Hemoglobin: 12.5 (ref 12.0–16.0)
Platelets: 188 10*3/uL (ref 150–400)
WBC: 4.9

## 2023-04-23 LAB — HEPATIC FUNCTION PANEL: Bilirubin, Total: 0.4

## 2023-04-23 LAB — COMPREHENSIVE METABOLIC PANEL
Albumin: 4.4 (ref 3.5–5.0)
Calcium: 8.6 — AB (ref 8.7–10.7)
Globulin: 1.8
eGFR: 99

## 2023-04-23 LAB — VITAMIN D 25 HYDROXY (VIT D DEFICIENCY, FRACTURES): Vit D, 25-Hydroxy: 33

## 2023-04-23 LAB — LIPID PANEL
Cholesterol: 167 (ref 0–200)
HDL: 57 (ref 35–70)
LDL Cholesterol: 95
LDl/HDL Ratio: 2.9
Triglycerides: 78 (ref 40–160)

## 2023-04-23 LAB — CBC: RBC: 4.1 (ref 3.87–5.11)

## 2023-04-23 LAB — BASIC METABOLIC PANEL
BUN: 11 (ref 4–21)
CO2: 25 — AB (ref 13–22)
Chloride: 103 (ref 99–108)
Creatinine: 0.7 (ref 0.5–1.1)
Glucose: 91
Potassium: 4.1 meq/L (ref 3.5–5.1)
Sodium: 140 (ref 137–147)

## 2023-04-28 DIAGNOSIS — M1612 Unilateral primary osteoarthritis, left hip: Secondary | ICD-10-CM | POA: Diagnosis not present

## 2023-05-08 ENCOUNTER — Other Ambulatory Visit: Payer: Self-pay | Admitting: Family Medicine

## 2023-05-08 DIAGNOSIS — Z1231 Encounter for screening mammogram for malignant neoplasm of breast: Secondary | ICD-10-CM

## 2023-05-11 ENCOUNTER — Ambulatory Visit
Admission: RE | Admit: 2023-05-11 | Discharge: 2023-05-11 | Disposition: A | Discharge status: Home / Self Care | Payer: Commercial Managed Care - PPO | Source: Ambulatory Visit | Attending: Family Medicine | Admitting: Family Medicine

## 2023-05-11 DIAGNOSIS — Z1231 Encounter for screening mammogram for malignant neoplasm of breast: Secondary | ICD-10-CM | POA: Insufficient documentation

## 2023-05-25 NOTE — Progress Notes (Unsigned)
Cardiology Office Note  Date:  05/26/2023   ID:  Carla Green, DOB 1961/01/06, MRN [id number]  PCP:  Lyna Poser, FNP   Chief Complaint  Patient presents with   New Patient (Initial Visit)    Establish care for palpitations. Patient c/o chest pain off & on for the past couple of months, quit Crestor 7 days ago due to muscle/joint pain.  Medications reviewed by he patient verbally.     HPI:  Carla Green is a very pleasant 62 year old woman with a history of Palpitations Anxiety issues s/p hysterectomy. problems with constipation hormone therapy, on a patch Recent CT coronary calcium score of 0 Presents for routine follow-up of palpitations, chest pain  Last seen by myself in clinic March 2020  BP elevated recently even at home Has not been taking her metoprolol succinate or lisinopril Pressure did seem to be better on the metoprolol but was inconsistent with that Concerned about cough on lisinopril  Was started on Crestor 10 daily, stopped the medication secondary to leg pain  total chol 12/23: 249, before medication LDL 172 Down to total cholesterol 167 on crestor 10  Willing to retry metoprolol succinate 50 daily  Interested in getting a breast lift next month  Has not been going to the gym as she used to  Some atypical chest pain at times, not associated with exertion   EKG personally reviewed by myself on todays visit EKG Interpretation Date/Time:  Tuesday May 26 2023 15:47:55 EDT Ventricular Rate:  91 PR Interval:  158 QRS Duration:  78 QT Interval:  370 QTC Calculation: 455 R Axis:   35  Text Interpretation: Normal sinus rhythm Normal ECG No previous ECGs available Confirmed by Julien Nordmann [phone]) on 05/26/2023 4:08:44 PM   Other past medical history Previous problems with Insomnia. Palpitations more at nighttime. It was listening with a stethoscope Previous Lab work from primary care reviewed hemoglobin A1c 5.2, creatinine 0.73, hematocrit 39, TSH 1.0,  total cholesterol 245, LDL 142 EKG from primary care August 2015 showing no ectopy otherwise normal EKG    long history of burning in her legs. Her symptoms seem to flareup more when she walks. She reports after a busy day at work, she is more symptomatic. She has tried Neurontin with no benefit.   She does have significant stressors. Mother passed away in Dec 18, 2013. She helps to take care of her elderly father. Also has in-laws that she helps to take care of. She has some marital issues, also other stress at home, possibly children. This has caused significant anxiety for her She has started working night shifts    Previous stress echo at [facility] March 2011 which was normal. She exercised for 9 minutes, peak heart rate 169 beats per minute, normal echo at rest and stress  PMH:   has a past medical history of Anxiety, Chickenpox, Complication of anesthesia, Depression, Dysrhythmia, History of kidney stones, IBS (irritable bowel syndrome), Insomnia, Menopausal state, Mixed hyperlipidemia, Neuropathy, Palpitations, PONV (postoperative nausea and vomiting), PVC (premature ventricular contraction), Uterine hyperplasia, and Varicose veins.  PSH:    Past Surgical History:  Procedure Laterality Date   ABDOMINAL HYSTERECTOMY     COLONOSCOPY     COLONOSCOPY WITH PROPOFOL N/A 01/11/2016   Procedure: COLONOSCOPY WITH PROPOFOL;  Surgeon: Wallace Cullens, MD;  Location: [facility] ENDOSCOPY;  Service: Gastroenterology;  Laterality: N/A;   ESOPHAGOGASTRODUODENOSCOPY  2013   normal   ESOPHAGOGASTRODUODENOSCOPY (EGD) WITH PROPOFOL N/A 01/11/2016   Procedure: ESOPHAGOGASTRODUODENOSCOPY (  EGD) WITH PROPOFOL;  Surgeon: Wallace Cullens, MD;  Location: [facility] END[facility]COPY;  Service: Gastroenterology;  Laterality: N/A;   eye brow surgery     HERNIA REPAIR     femoral   HYSTEROSCOPY WITH D & C N/A 05/07/2015   Procedure: DILATATION AND CURETTAGE /HYSTEROSCOPY;  Surgeon: Christeen Douglas, MD;  Location: [facility] ORS;  Service:  Gynecology;  Laterality: N/A;   LAPAROSCOPIC TOTAL HYSTERECTOMY     leg vein     TONSILLECTOMY     VARICOSE VEIN SURGERY     laser    Current Outpatient Medications  Medication Sig Dispense Refill   acetaminophen (TYLENOL) 325 MG tablet Take 325 mg by mouth every 4 (four) hours as needed.     ALPRAZolam (XANAX) 0.5 MG tablet TAKE 1 TABLET BY MOUTH AT BEDTIME 90 tablet 1   estradiol (CLIMARA - DOSED IN MG/24 HR) 0.05 mg/24hr patch PLACE 1 PATCH ONTO THE SKIN ONCE A WEEK **PREFERS SANDOZ [phone]** 8 patch 5   fexofenadine (ALLEGRA) 180 MG tablet Take 180 mg by mouth daily.     fluticasone (FLONASE) 50 MCG/ACT nasal spray Place 2 sprays into both nostrils daily as needed for allergies or rhinitis.     ibuprofen (ADVIL,MOTRIN) 200 MG tablet Take 600 mg by mouth every 6 (six) hours as needed.     linaclotide (LINZESS) 290 MCG CAPS capsule Take 1 capsule (290 mcg total) by mouth daily before breakfast. 30 capsule 0   estradiol (CLIMARA - DOSED IN MG/24 HR) 0.05 mg/24hr patch PLACE 1 PATCH ONTO THE SKIN ONCE A WEEK **PREFERS Adair Patter [phone]** (Patient not taking: Reported on 05/26/2023) 8 patch 5   traMADol (ULTRAM) 50 MG tablet Take 1 tablet (50 mg total) by mouth every 6 (six) hours as needed. (Patient not taking: Reported on 05/26/2023) 15 tablet 0   No current facility-administered medications for this visit.    Allergies:   Codeine, Codeine sulfate, and Duloxetine   Social History:  The patient  reports that she has never smoked. She has never used smokeless tobacco. She reports that she does not drink alcohol and does not use drugs.   Family History:   family history includes Colon cancer in her father; Heart attack in her father; Heart attack (age of onset: 68) in her paternal grandmother; Heart attack (age of onset: 81) in her paternal uncle; Heart disease in her father and paternal uncle.    Review of Systems: Review of Systems  Constitutional: Negative.   HENT: Negative.     Respiratory: Negative.    Cardiovascular:  Positive for chest pain.  Gastrointestinal: Negative.   Musculoskeletal: Negative.   Neurological: Negative.   Psychiatric/Behavioral: Negative.    All other systems reviewed and are negative.    PHYSICAL EXAM: VS:  BP (!) 160/68 (BP Location: Right Arm, Patient Position: Sitting, Cuff Size: Normal)   Pulse 91   Ht 5\' 2"  (1.575 m)   Wt 130 lb 4 oz (59.1 kg)   LMP 04/05/2014   SpO2 98%   BMI 23.82 kg/m  , BMI Body mass index is 23.82 kg/m. GEN: Well nourished, well developed, in no acute distress HEENT: normal Neck: no JVD, carotid bruits, or masses Cardiac: RRR; no murmurs, rubs, or gallops,no edema  Respiratory:  clear to auscultation bilaterally, normal work of breathing GI: soft, nontender, nondistended, + BS MS: no deformity or atrophy Skin: warm and dry, no rash Neuro:  Strength and sensation are intact Psych: euthymic mood, full affect   Recent  Labs: No results found for requested labs within last 365 days.    Lipid Panel Lab Results  Component Value Date   CHOL 281 (H) 03/29/2020   HDL 58 03/29/2020   LDLCALC 197 (H) 03/29/2020   TRIG 143 03/29/2020      Wt Readings from Last 3 Encounters:  05/26/23 130 lb 4 oz (59.1 kg)  03/29/20 128 lb (58.1 kg)  05/25/19 124 lb (56.2 kg)       ASSESSMENT AND PLAN:  Problem List Items Addressed This Visit       Cardiology Problems   Mixed hyperlipidemia   Essential hypertension - Primary   Relevant Orders   EKG 12-Lead (Completed)   Beat, premature ventricular   Relevant Orders   EKG 12-Lead (Completed)   Chest pain Atypical in nature, likely musculoskeletal, not associated with exertion No further ischemic workup needed at this time Calcium score 0 several years ago, low risk  Essential hypertension Also with elevated heart rate, recommended she restart her metoprolol succinate 50 daily If blood pressure continues to run high may need to start  ARB Concerned about potential cough on ACE inhibitor's  Hyperlipidemia Reports she did not tolerate Crestor 10 despite improved total cholesterol down to 160 Developed that and myalgias and stopped the medication Recommend she try Zetia 10 mg daily Potentially if lower numbers desired, could add bempedoic acid  PVCs Will start metoprolol succinate 50 mg daily as above   Total encounter time more than 50 minutes  Greater than 50% was spent in counseling and coordination of care with the patient    Signed, Dossie Arbour, M.D., Ph.D. [facility] [place], [place] [phone]

## 2023-05-26 ENCOUNTER — Encounter: Payer: Self-pay | Admitting: Cardiovascular Disease

## 2023-05-26 ENCOUNTER — Ambulatory Visit: Payer: Commercial Managed Care - PPO | Attending: Cardiovascular Disease | Admitting: Cardiovascular Disease

## 2023-05-26 ENCOUNTER — Other Ambulatory Visit: Payer: Self-pay

## 2023-05-26 VITALS — BP 160/68 | HR 91 | Ht 62.0 in | Wt 130.2 lb

## 2023-05-26 DIAGNOSIS — I10 Essential (primary) hypertension: Secondary | ICD-10-CM | POA: Diagnosis not present

## 2023-05-26 DIAGNOSIS — E782 Mixed hyperlipidemia: Secondary | ICD-10-CM | POA: Diagnosis not present

## 2023-05-26 DIAGNOSIS — I493 Ventricular premature depolarization: Secondary | ICD-10-CM | POA: Diagnosis not present

## 2023-05-26 MED ORDER — METOPROLOL SUCCINATE ER 50 MG PO TB24
50.0000 mg | ORAL_TABLET | Freq: Every day | ORAL | 3 refills | Status: DC
  Filled 2023-05-26: qty 90, 90d supply, fill #0
  Filled 2024-01-13: qty 90, 90d supply, fill #1
  Filled 2024-03-24: qty 30, 30d supply, fill #2
  Filled 2024-05-23: qty 30, 30d supply, fill #3

## 2023-05-26 MED ORDER — EZETIMIBE 10 MG PO TABS
10.0000 mg | ORAL_TABLET | Freq: Every day | ORAL | 3 refills | Status: DC
  Filled 2023-05-26: qty 90, 90d supply, fill #0

## 2023-05-26 NOTE — Patient Instructions (Addendum)
Medication Instructions:  Please start zetia 10 mg daily  Please restart metoprolol succinate 50 daily  If you need a refill on your cardiac medications before your next appointment, please call your pharmacy.   Lab work: No new labs needed  Testing/Procedures: No new testing needed  Follow-Up: At [facility], you and your health needs are our priority.  As part of our continuing mission to provide you with exceptional heart care, we have created designated Provider Care Teams.  These Care Teams include your primary Cardiologist (physician) and Advanced Practice Providers (APPs -  Physician Assistants and Nurse Practitioners) who all work together to provide you with the care you need, when you need it.  You will need a follow up appointment in 12 months  Providers on your designated Care Team:   Nicolasa Ducking, NP Eula Listen, PA-C Cadence Fransico Michael, [place]  COVID-19 Vaccine Information can be found at: [email or web address] For questions related to vaccine distribution or appointments, please email [email or web address] or call 4016116705. \

## 2023-06-12 ENCOUNTER — Encounter: Payer: Self-pay | Admitting: Cardiovascular Disease

## 2023-06-17 ENCOUNTER — Telehealth: Payer: Self-pay | Admitting: Cardiovascular Disease

## 2023-06-17 DIAGNOSIS — I10 Essential (primary) hypertension: Secondary | ICD-10-CM | POA: Diagnosis not present

## 2023-06-17 DIAGNOSIS — G4700 Insomnia, unspecified: Secondary | ICD-10-CM | POA: Diagnosis not present

## 2023-06-17 DIAGNOSIS — K5904 Chronic idiopathic constipation: Secondary | ICD-10-CM | POA: Diagnosis not present

## 2023-06-17 NOTE — Telephone Encounter (Signed)
Good Afternoon Dr. Mariah Milling  We have received a surgical clearance request for breast augmentation surgery for [name]. They were seen recently in clinic on 05/26/2023.  She has a past medical history of HTN, PVCs, HLD, atypical chest pain Can  you please comment on surgical clearance for upcoming breast augmentation procedure. Please forward you guidance and recommendations to P CV DIV PREOP   Thank you, Robin Searing, NP

## 2023-06-17 NOTE — Telephone Encounter (Signed)
   Pre-operative Risk Assessment    Patient Name: Carla Green  DOB: 09-28-1961 MRN: [id number]      Request for Surgical Clearance    Procedure:   breast augmentation  Date of Surgery:  Clearance TBD                                 Surgeon:  Wynn Maudlin Surgeon's Group or Practice Name:  [facility]  Phone number:  [phone] Fax number:  [phone]   Type of Clearance Requested:   - Medical    Type of Anesthesia:  MAC   Additional requests/questions:    Signed, Shawna Orleans   06/17/2023, 1:40 PM

## 2023-06-21 NOTE — Telephone Encounter (Signed)
   Name: Carla Green  DOB: 08/11/1961  MRN: [id number]   Primary Cardiologist: None  Chart reviewed as part of pre-operative protocol coverage. Carla Green was last seen on 05/26/2023 by Dr. Mariah Milling.  Per Dr. Mariah Milling, patient is an "acceptable risk for surgery. No further cardiac testing needed."  Therefore, based on ACC/AHA guidelines, the patient would be at acceptable risk for the planned procedure without further cardiovascular testing.   I will route this recommendation to the requesting party via Epic fax function and remove from pre-op pool. Please call with questions.  Carlos Levering, NP 06/21/2023, 7:20 PM

## 2023-07-01 DIAGNOSIS — J019 Acute sinusitis, unspecified: Secondary | ICD-10-CM | POA: Diagnosis not present

## 2023-07-16 ENCOUNTER — Telehealth: Payer: Self-pay | Admitting: Cardiovascular Disease

## 2023-07-16 NOTE — Telephone Encounter (Signed)
Office called to request a copy of EKG to be faxed over to [phone] because they can't scheduled her procedure until the have it. Pt would've sent it herself but she's locked out of her MyChart right now. Please advise

## 2023-07-21 NOTE — Telephone Encounter (Signed)
Message sent from medical records department   Rulon Eisenmenger hours ago (9:11 AM)   JS Spoke with the patient she has already sent her EKG.

## 2023-08-05 DIAGNOSIS — J069 Acute upper respiratory infection, unspecified: Secondary | ICD-10-CM | POA: Diagnosis not present

## 2023-08-05 DIAGNOSIS — Z6823 Body mass index (BMI) 23.0-23.9, adult: Secondary | ICD-10-CM | POA: Diagnosis not present

## 2023-08-20 ENCOUNTER — Other Ambulatory Visit: Payer: Self-pay

## 2023-08-20 ENCOUNTER — Telehealth: Payer: Commercial Managed Care - PPO | Admitting: Physician Assistant

## 2023-08-20 DIAGNOSIS — H6503 Acute serous otitis media, bilateral: Secondary | ICD-10-CM | POA: Diagnosis not present

## 2023-08-20 DIAGNOSIS — J019 Acute sinusitis, unspecified: Secondary | ICD-10-CM

## 2023-08-20 DIAGNOSIS — B9689 Other specified bacterial agents as the cause of diseases classified elsewhere: Secondary | ICD-10-CM | POA: Diagnosis not present

## 2023-08-20 MED ORDER — AMOXICILLIN-POT CLAVULANATE 875-125 MG PO TABS
1.0000 | ORAL_TABLET | Freq: Two times a day (BID) | ORAL | 0 refills | Status: DC
Start: 2023-08-20 — End: 2023-10-24
  Filled 2023-08-20: qty 20, 10d supply, fill #0

## 2023-08-20 NOTE — Progress Notes (Signed)

## 2023-08-24 ENCOUNTER — Other Ambulatory Visit: Payer: Self-pay

## 2023-08-24 MED ORDER — ESTRADIOL 0.1 MG/24HR TD PTTW
1.0000 | MEDICATED_PATCH | TRANSDERMAL | 3 refills | Status: DC
  Filled 2023-08-24: qty 24, 84d supply, fill #0

## 2023-08-25 ENCOUNTER — Other Ambulatory Visit: Payer: Self-pay

## 2023-08-26 ENCOUNTER — Other Ambulatory Visit: Payer: Self-pay

## 2023-10-23 ENCOUNTER — Telehealth: Payer: Commercial Managed Care - PPO

## 2023-10-23 DIAGNOSIS — J019 Acute sinusitis, unspecified: Secondary | ICD-10-CM

## 2023-10-23 DIAGNOSIS — B9689 Other specified bacterial agents as the cause of diseases classified elsewhere: Secondary | ICD-10-CM

## 2023-10-24 MED ORDER — AMOXICILLIN-POT CLAVULANATE 875-125 MG PO TABS
1.0000 | ORAL_TABLET | Freq: Two times a day (BID) | ORAL | 0 refills | Status: DC
Start: 2023-10-24 — End: 2024-01-11

## 2023-10-24 NOTE — Progress Notes (Signed)
I have spent 5 minutes in review of e-visit questionnaire, review and updating patient chart, medical decision making and response to patient.  ° °Jerrell Mangel W Secilia Apps, NP ° °  °

## 2023-10-24 NOTE — Progress Notes (Signed)

## 2023-11-24 DIAGNOSIS — G4700 Insomnia, unspecified: Secondary | ICD-10-CM | POA: Diagnosis not present

## 2023-11-24 DIAGNOSIS — F419 Anxiety disorder, unspecified: Secondary | ICD-10-CM | POA: Diagnosis not present

## 2023-12-29 DIAGNOSIS — G4700 Insomnia, unspecified: Secondary | ICD-10-CM | POA: Diagnosis not present

## 2023-12-29 DIAGNOSIS — F419 Anxiety disorder, unspecified: Secondary | ICD-10-CM | POA: Diagnosis not present

## 2024-01-11 ENCOUNTER — Encounter: Payer: Self-pay | Admitting: Physician Assistant

## 2024-01-11 ENCOUNTER — Ambulatory Visit (INDEPENDENT_AMBULATORY_CARE_PROVIDER_SITE_OTHER): Payer: Self-pay | Admitting: Physician Assistant

## 2024-01-11 VITALS — BP 122/66 | HR 68 | Temp 97.8°F | Ht 62.0 in | Wt 129.0 lb

## 2024-01-11 DIAGNOSIS — H9203 Otalgia, bilateral: Secondary | ICD-10-CM

## 2024-01-11 DIAGNOSIS — F5101 Primary insomnia: Secondary | ICD-10-CM

## 2024-01-11 DIAGNOSIS — K5909 Other constipation: Secondary | ICD-10-CM

## 2024-01-11 DIAGNOSIS — I491 Atrial premature depolarization: Secondary | ICD-10-CM

## 2024-01-11 DIAGNOSIS — K5904 Chronic idiopathic constipation: Secondary | ICD-10-CM

## 2024-01-11 NOTE — Progress Notes (Signed)
 Date:  01/11/2024   Name:  Carla Green   DOB:  1961-02-06   MRN:  [id number]   Chief Complaint: Establish Care  Otalgia  There is pain in both ears. This is a new problem. Episode onset: X5 days. Episode frequency: comes and goes. The problem has been gradually worsening. There has been no fever. The pain is at a severity of 2/10. The pain is mild. Associated symptoms include a sore throat. She has tried NSAIDs (flonase, allegra) for the symptoms. The treatment provided mild relief.   Carla Green is a very pleasant 63 y.o. female who presents new to the clinic today to establish care. Also having some mild otalgia and sore throat x5d, no sick contacts, endorses frequent ear and sinus infections.   Asks about meds for constipation. Has tried Miralax, Dulcolax, mag citrate, stool softeners, and even some prescription medications. Admits she could stand to improve intake of fruits and vegetables.   Voices some hesitation to continue her long-term use of alprazolam which she uses for sleep.  She takes it every night, usually 0.25 mg unless she has a 12-hour shift in which case she takes 0.5 mg.  Reports good efficacy with no side effects.  Previously tried and failed trazodone, Ambien, Lunesta, Unisom, melatonin, hydroxyzine.  Mentions chronic intermittent chest discomfort/tightness, nonexertional, none in the last 5 days. Says it got worse after her breast augmentation. Has previously seen cardiology Dr. Mariah Milling with most recent visit July 2024. Chart review shows history of PACs. Patient endorses palpitations.  She does not take metoprolol every day, and did not take it today.  Medication list has been reviewed and updated.  Current Meds  Medication Sig   acetaminophen (TYLENOL) 325 MG tablet Take 325 mg by mouth every 4 (four) hours as needed.   ALPRAZolam (XANAX) 0.5 MG tablet TAKE 1 TABLET BY MOUTH AT BEDTIME   estradiol (VIVELLE-DOT) 0.1 MG/24HR patch Place 1 patch (0.1 mg total) onto the skin  2 (two) times a week.   ezetimibe (ZETIA) 10 MG tablet Take 1 tablet (10 mg total) by mouth daily.   fexofenadine (ALLEGRA) 180 MG tablet Take 180 mg by mouth daily.   fluticasone (FLONASE) 50 MCG/ACT nasal spray Place 2 sprays into both nostrils daily as needed for allergies or rhinitis.   ibuprofen (ADVIL,MOTRIN) 200 MG tablet Take 600 mg by mouth every 6 (six) hours as needed.   metoprolol succinate (TOPROL-XL) 50 MG 24 hr tablet Take 1 tablet (50 mg total) by mouth daily. Take with or immediately following a meal.   [DISCONTINUED] estradiol (CLIMARA - DOSED IN MG/24 HR) 0.05 mg/24hr patch PLACE 1 PATCH ONTO THE SKIN ONCE A WEEK **PREFERS [employer] Patter [phone]**   [DISCONTINUED] estradiol (VIVELLE-DOT) 0.1 MG/24HR patch Place 1 patch (0.1 mg total) onto the skin 2 (two) times a week.   [DISCONTINUED] sertraline (ZOLOFT) 25 MG tablet Take 50 mg by mouth daily.     Review of Systems  HENT:  Positive for ear pain and sore throat.     Patient Active Problem List   Diagnosis Date Noted   Chronic idiopathic constipation 03/29/2020   Primary insomnia 03/29/2020   Vitamin D deficiency 03/30/2019   Tinnitus aurium, bilateral 02/15/2019   HSV-1 infection 02/15/2019   Eustachian tube disorder, bilateral 02/15/2019   Generalized anxiety disorder 09/23/2018   Anemia 02/11/2018   Arthritis of left hip 12/09/2016   Osteopenia determined by x-ray 12/09/2016   Mixed hyperlipidemia 10/28/2016   Neuropathic pain, leg, bilateral  07/14/2016   Environmental and seasonal allergies 07/14/2016   Essential hypertension 08/15/2014   Irritable bowel syndrome with constipation 06/27/2014   Neuritis or radiculitis due to rupture of lumbar intervertebral disc 06/27/2014   Beat, premature ventricular 06/27/2014   Phlebectasia 06/27/2014    Allergies  Allergen Reactions   Codeine Other (See Comments) and Itching   Codeine Sulfate Itching   Duloxetine Nausea Only    Immunization History  Administered  Date(s) Administered   Influenza-Unspecified 09/18/2015, 07/27/2018, 11/25/2023   Tdap 12/19/2011    Past Surgical History:  Procedure Laterality Date   ABDOMINAL HYSTERECTOMY     BREAST ENHANCEMENT SURGERY     COLONOSCOPY     COLONOSCOPY WITH PROPOFOL N/A 01/11/2016   Procedure: COLONOSCOPY WITH PROPOFOL;  Surgeon: Wallace Cullens, MD;  Location: [facility] ENDOSCOPY;  Service: Gastroenterology;  Laterality: N/A;   ESOPHAGOGASTRODUODENOSCOPY  2013   normal   ESOPHAGOGASTRODUODENOSCOPY (EGD) WITH PROPOFOL N/A 01/11/2016   Procedure: ESOPHAGOGASTRODUODENOSCOPY (EGD) WITH PROPOFOL;  Surgeon: Wallace Cullens, MD;  Location: [facility] ENDOSCOPY;  Service: Gastroenterology;  Laterality: N/A;   eye brow surgery     HERNIA REPAIR     femoral   HYSTEROSCOPY WITH D & C N/A 05/07/2015   Procedure: DILATATION AND CURETTAGE /HYSTEROSCOPY;  Surgeon: Christeen Douglas, MD;  Location: [facility] ORS;  Service: Gynecology;  Laterality: N/A;   LAPAROSCOPIC TOTAL HYSTERECTOMY     leg vein     TONSILLECTOMY     VARICOSE VEIN SURGERY     laser    Social History   Tobacco Use   Smoking status: Never   Smokeless tobacco: Never  Vaping Use   Vaping status: Never Used  Substance Use Topics   Alcohol use: No    Alcohol/week: 0.0 standard drinks of alcohol   Drug use: No    Family History  Problem Relation Age of Onset   Diabetes Father    Cancer Father    Heart attack Father        107   Heart disease Father        CABG   Colon cancer Father    Heart attack Paternal Uncle 49   Heart disease Paternal Uncle        CABG x 4    Heart attack Paternal Grandmother 48   Breast cancer Neg Hx         01/11/2024   10:12 AM 09/23/2018   10:50 AM 08/09/2018    2:54 PM  GAD 7 : Generalized Anxiety Score  Nervous, Anxious, on Edge 0 0 3  Control/stop worrying 0 0 3  Worry too much - different things 0 0 3  Trouble relaxing 0 0 3  Restless 0 0 3  Easily annoyed or irritable 0 0 3  Afraid - awful might happen 0 0 3  Total  GAD 7 Score 0 0 21  Anxiety Difficulty Not difficult at all  Extremely difficult       01/11/2024   10:12 AM 03/29/2020    9:44 AM 05/25/2019    2:40 PM  Depression screen PHQ 2/9  Decreased Interest 0 0 0  Down, Depressed, Hopeless 0 0 0  PHQ - 2 Score 0 0 0  Altered sleeping  0 0  Tired, decreased energy  0 0  Change in appetite  0 0  Feeling bad or failure about yourself   0 0  Trouble concentrating  0 0  Moving slowly or fidgety/restless  0 0  Suicidal thoughts  0 0  PHQ-9 Score  0 0  Difficult doing work/chores  Not difficult at all Not difficult at all    BP Readings from Last 3 Encounters:  01/11/24 122/66  05/26/23 (!) 160/68  03/29/20 124/66    Wt Readings from Last 3 Encounters:  01/11/24 129 lb (58.5 kg)  05/26/23 130 lb 4 oz (59.1 kg)  03/29/20 128 lb (58.1 kg)    BP 122/66   Pulse 68   Temp 97.8 F (36.6 C)   Ht 5\' 2"  (1.575 m)   Wt 129 lb (58.5 kg)   LMP 04/05/2014   SpO2 97%   BMI 23.59 kg/m   Physical Exam Vitals and nursing note reviewed.  Constitutional:      General: She is not in acute distress.    Appearance: Normal appearance.  HENT:     Right Ear: Tympanic membrane normal.     Left Ear: Tympanic membrane normal.     Ears:     Comments: EAC clear bilaterally with good view of TM which is without effusion or erythema.     Nose: Nose normal.     Comments: Sinuses nontender    Mouth/Throat:     Mouth: Mucous membranes are moist.     Pharynx: No oropharyngeal exudate or posterior oropharyngeal erythema.  Eyes:     Conjunctiva/sclera: Conjunctivae normal.     Pupils: Pupils are equal, round, and reactive to light.  Cardiovascular:     Rate and Rhythm: Normal rate and regular rhythm. Frequent Extrasystoles are present.    Heart sounds: No murmur heard.    No friction rub. No gallop.  Pulmonary:     Effort: Pulmonary effort is normal.     Breath sounds: Normal breath sounds. No wheezing, rhonchi or rales.  Abdominal:     General:  There is no distension.  Musculoskeletal:        General: Normal range of motion.  Lymphadenopathy:     Cervical: No cervical adenopathy.  Skin:    General: Skin is warm and dry.  Neurological:     Mental Status: She is alert and oriented to person, place, and time.     Gait: Gait is intact.  Psychiatric:        Mood and Affect: Mood and affect normal.    EKG: Sinus rhythm at 73 bpm with frequent PACs (totaling 5 PACs on this strip). No ischemic changes.  Recent Labs     Component Value Date/Time   NA 143 03/29/2020 1033   NA 139 03/10/2014 1657   K 4.4 03/29/2020 1033   K 3.9 03/10/2014 1657   CL 104 03/29/2020 1033   CL 106 03/10/2014 1657   CO2 25 03/29/2020 1033   CO2 26 03/10/2014 1657   GLUCOSE 88 03/29/2020 1033   GLUCOSE 107 (H) 07/31/2015 0509   GLUCOSE 101 (H) 03/10/2014 1657   BUN 9 03/29/2020 1033   BUN 13 03/10/2014 1657   CREATININE 0.63 03/29/2020 1033   CREATININE 0.65 03/10/2014 1657   CALCIUM 9.4 03/29/2020 1033   CALCIUM 8.6 03/10/2014 1657   PROT 6.8 03/29/2020 1033   PROT 6.9 03/10/2014 1657   ALBUMIN 4.6 03/29/2020 1033   ALBUMIN 3.5 03/10/2014 1657   AST 25 03/29/2020 1033   AST 30 03/10/2014 1657   ALT 34 (H) 03/29/2020 1033   ALT 21 03/10/2014 1657   ALKPHOS 56 03/29/2020 1033   ALKPHOS 44 (L) 03/10/2014 1657   BILITOT 0.6 03/29/2020 1033   BILITOT  0.3 03/10/2014 1657   GFRNONAA 98 03/29/2020 1033   GFRNONAA >60 03/10/2014 1657   GFRAA 114 03/29/2020 1033   GFRAA >60 03/10/2014 1657    Lab Results  Component Value Date   WBC 5.0 03/29/2020   HGB 13.5 03/29/2020   HCT 39.2 03/29/2020   MCV 92 03/29/2020   PLT 234 03/29/2020   Lab Results  Component Value Date   HGBA1C 5.4 03/29/2019   Lab Results  Component Value Date   CHOL 281 (H) 03/29/2020   HDL 58 03/29/2020   LDLCALC 197 (H) 03/29/2020   TRIG 143 03/29/2020   CHOLHDL 4.8 (H) 03/29/2020   Lab Results  Component Value Date   TSH 0.883 03/29/2020     Assessment  and Plan:  1. Ear pain, bilateral (Primary) Normal exam today.  Probably mild eustachian tube dysfunction with recent barometric changes.  Continue Flonase, Allegra, ibuprofen as needed.  2. Primary insomnia Discussed with patient I think it is perfectly reasonable for her to take low-dose alprazolam if it is well-tolerated and effective.  In the future, might consider other options such as low-dose mirtazapine or Seroquel, but if she is doing well with the current therapy, I would hesitate to change this right now.  Reviewed common practices to improve sleep hygiene including consistent sleep schedule, cool sleeping temperatures, keeping bedroom as dark as possible, daytime physical activity (especially resistance training), avoiding harmful bedtime habits (e.g. TV, phone use, etc),  avoiding oral intake within 1 hour of bed, avoiding evening caffeine/alcohol consumption.  Consider mindfulness exercises for sleep e.g. Calm app, yoga, etc.   3. Premature atrial contractions Reviewed EKG with patient today.  Patient reassured.  Advised to take metoprolol every day.  Recommended she see Dr. Mariah Milling if further concerns for chest discomfort. - EKG 12-Lead  4. Chronic constipation Would benefit from increased fiber intake especially from fruits and vegetables. This would also likely have benefits in reducing risk of diverticulitis flare.    Return in about 3 months (around 04/09/2024) for OV f/u chronic conditions.    Alvester Morin, PA-C, DMSc, Nutritionist [facility] [phone]

## 2024-01-11 NOTE — Patient Instructions (Signed)
-  It was a pleasure to see you today! Please review your visit summary for helpful information -I would encourage you to follow your care via MyChart where you can access lab results, notes, messages, and more -Please send [place] your most recent lab results from your previous provider in a MyChart message -If you feel that we did a nice job today, please complete your after-visit survey and leave [place] a Google review! Your CMA today was Mariann Barter and your provider was Alvester Morin, PA-C, DMSc -Please return for follow-up in about 3 months

## 2024-01-14 ENCOUNTER — Other Ambulatory Visit: Payer: Self-pay

## 2024-02-08 ENCOUNTER — Other Ambulatory Visit: Payer: Self-pay | Admitting: Physician Assistant

## 2024-02-08 DIAGNOSIS — F419 Anxiety disorder, unspecified: Secondary | ICD-10-CM

## 2024-02-08 NOTE — Telephone Encounter (Signed)
 Copied from CRM [id number]. Topic: Clinical - Medication Refill >> Feb 08, 2024  2:37 PM Macon Large wrote: Most Recent Primary Care Visit:  Provider: Remo Lipps  Department: [facility]  Visit Type: NEW PATIENT  Date: 01/11/2024  Medication: ALPRAZolam Prudy Feeler) 0.5 MG tablet  Has the patient contacted their pharmacy? No  Is this the correct pharmacy for this prescription? Yes If no, delete pharmacy and type the correct one.  This is the patient's preferred pharmacy:  [id number] - [place], [place] - [address] AT [facility] [address] [place] [address] Phone: [phone] Fax: [phone]   Has the prescription been filled recently? No  Is the patient out of the medication? No  Has the patient been seen for an appointment in the last year OR does the patient have an upcoming appointment? Yes  Can we respond through MyChart? Yes  Agent: Please be advised that Rx refills may take up to 3 business days. We ask that you follow-up with your pharmacy.

## 2024-02-09 NOTE — Telephone Encounter (Signed)
 Requested medication (s) are due for refill today - expired Rx  Requested medication (s) are on the active medication list -yes  Future visit scheduled -yes  Last refill: 10/5/221 #90 1RF  Notes to clinic: non delegated Rx  Requested Prescriptions  Pending Prescriptions Disp Refills   ALPRAZolam (XANAX) 0.5 MG tablet 90 tablet 1    Sig: Take 1 tablet (0.5 mg total) by mouth at bedtime.     Not Delegated - Psychiatry: Anxiolytics/Hypnotics 2 Failed - 02/09/2024  3:28 PM      Failed - This refill cannot be delegated      Failed - Urine Drug Screen completed in last 360 days      Failed - Valid encounter within last 6 months    Recent Outpatient Visits           3 years ago Annual physical exam   [place] [facility] at [facility], Nyoka Cowden, MD   4 years ago Bronchitis   [place] [facility] at [facility], Nyoka Cowden, MD   4 years ago Annual physical exam   [facility] at [facility], Nyoka Cowden, MD   4 years ago Eustachian tube disorder, bilateral   [place] [facility] at [facility], Nyoka Cowden, MD   5 years ago Acute otitis media, unspecified otitis media type   [facility] at [facility], Nyoka Cowden, MD       Future Appointments             In 2 months Mordecai Maes Melton Alar, PA [facility] Medicine at [facility], [facility]            Passed - Patient is not pregnant         Requested Prescriptions  Pending Prescriptions Disp Refills   ALPRAZolam (XANAX) 0.5 MG tablet 90 tablet 1    Sig: Take 1 tablet (0.5 mg total) by mouth at bedtime.     Not Delegated - Psychiatry: Anxiolytics/Hypnotics 2 Failed - 02/09/2024  3:28 PM      Failed - This refill cannot be delegated      Failed - Urine Drug Screen completed in last 360 days      Failed - Valid encounter  within last 6 months    Recent Outpatient Visits           3 years ago Annual physical exam   [place] [facility] at [facility], Nyoka Cowden, MD   4 years ago Bronchitis   [place] [facility] at [facility], Nyoka Cowden, MD   4 years ago Annual physical exam   [facility] at [facility], Nyoka Cowden, MD   4 years ago Eustachian tube disorder, bilateral   [place] [facility] at [facility], Nyoka Cowden, MD   5 years ago Acute otitis media, unspecified otitis media type   [facility] Medicine at [facility], Nyoka Cowden, MD       Future Appointments             In 2 months [place], Melton Alar, PA [facility] Primary Care & Sports Medicine at [facility], [facility]  Passed - Patient is not pregnant

## 2024-02-10 ENCOUNTER — Other Ambulatory Visit: Payer: Self-pay

## 2024-02-10 MED ORDER — ALPRAZOLAM 0.5 MG PO TABS
0.2500 mg | ORAL_TABLET | Freq: Every day | ORAL | 1 refills | Status: DC
  Filled 2024-02-10: qty 30, 30d supply, fill #0
  Filled 2024-03-24: qty 30, 30d supply, fill #1
  Filled 2024-04-13 – 2024-04-19 (×2): qty 30, 30d supply, fill #2
  Filled 2024-05-23: qty 30, 30d supply, fill #3

## 2024-02-10 NOTE — Telephone Encounter (Signed)
 Patient called for an update on the refill request. Please contact patient to advise.

## 2024-02-10 NOTE — Telephone Encounter (Signed)
 Please review.  KP

## 2024-02-16 ENCOUNTER — Other Ambulatory Visit: Payer: Self-pay

## 2024-03-08 ENCOUNTER — Ambulatory Visit: Payer: Self-pay

## 2024-03-08 NOTE — Telephone Encounter (Signed)
 Please call pt to schedule an appt for 11:20AM  KP

## 2024-03-08 NOTE — Telephone Encounter (Signed)
 Please review.  KP

## 2024-03-08 NOTE — Telephone Encounter (Signed)
 FYI  KP

## 2024-03-08 NOTE — Telephone Encounter (Signed)
 Copied from CRM [id number]. Topic: Clinical - Red Word Triage >> Mar 08, 2024  8:34 AM Ivette P wrote: Red Word that prompted transfer to Nurse Triage: Chest Pain, Taking 2 Blood Pressure Pills a Day instead of 1.   Chief Complaint: Chest Pain  Symptoms: Malaise, Fatigue, Dizziness, Dyspnea With Exertion   Frequency: Acute  Pertinent Negatives: Patient denies diaphoresis,  Disposition: [x] ED /[] Urgent Care (no appt availability in office) / [] Appointment(In office/virtual)/ []  [facility]/ [] Home Care/ [x] Refused Recommended Disposition /[] [facility]/ []  Follow-up with PCP Additional Notes: WN is being triaged for chest pain that has been occurring for the past month.  The patient reports having to take two blood pressure medications to control her blood pressure. Reports shortness of breath when she goes to the top of her steps.   The patient has been taking collagen powder in her coffee. The patient has also been taking prednisone .   Advised the patient to go to the ED due to presentation of symptoms, the patient did not understand the disposition and refused due to not having chest pains currently.   Reason for Disposition  Dizziness or lightheadedness  Answer Assessment - Initial Assessment Questions 1. LOCATION: "Where does it hurt?"       Off to the left, Tightness, Discomfort  2. RADIATION: "Does the pain go anywhere else?" (e.g., into neck, jaw, arms, back)     No  3. ONSET: "When did the chest pain begin?" (Minutes, hours or days)      6 months ago  4. PATTERN: "Does the pain come and go, or has it been constant since it started?"  "Does it get worse with exertion?"       Comes and goes, no worsening with exertion. Present at rest.  5. DURATION: "How long does it last" (e.g., seconds, minutes, hours)     All day when occurred  6. SEVERITY: "How bad is the pain?"  (e.g., Scale 1-10; mild, moderate, or severe)    - MILD (1-3): doesn't interfere  with normal activities     - MODERATE (4-7): interferes with normal activities or awakens from sleep    - SEVERE (8-10): excruciating pain, unable to do any normal activities       5  7. CARDIAC RISK FACTORS: "Do you have any history of heart problems or risk factors for heart disease?" (e.g., angina, prior heart attack; diabetes, high blood pressure, high cholesterol, smoker, or strong family history of heart disease)     Hypertension, Premature Atrial Contractions, High Cholesterol Father and Paternal Grandmother- Heart Attacks  8. PULMONARY RISK FACTORS: "Do you have any history of lung disease?"  (e.g., blood clots in lung, asthma, emphysema, birth control pills)     None   9. CAUSE: "What do you think is causing the chest pain?"     Unsure  10. OTHER SYMPTOMS: "Do you have any other symptoms?" (e.g., dizziness, nausea, vomiting, sweating, fever, difficulty breathing, cough)        Nausea  11. PREGNANCY: "Is there any chance you are pregnant?" "When was your last menstrual period?"       No and No  Protocols used: Chest Pain-A-AH

## 2024-03-24 ENCOUNTER — Other Ambulatory Visit: Payer: Self-pay

## 2024-04-12 ENCOUNTER — Telehealth: Payer: Self-pay | Admitting: Physician Assistant

## 2024-04-13 ENCOUNTER — Other Ambulatory Visit: Payer: Self-pay

## 2024-04-13 ENCOUNTER — Telehealth (INDEPENDENT_AMBULATORY_CARE_PROVIDER_SITE_OTHER): Payer: Self-pay | Admitting: Physician Assistant

## 2024-04-13 ENCOUNTER — Encounter: Payer: Self-pay | Admitting: Physician Assistant

## 2024-04-13 VITALS — Ht 62.0 in

## 2024-04-13 DIAGNOSIS — E782 Mixed hyperlipidemia: Secondary | ICD-10-CM | POA: Diagnosis not present

## 2024-04-13 DIAGNOSIS — R0789 Other chest pain: Secondary | ICD-10-CM

## 2024-04-13 MED ORDER — PRAVASTATIN SODIUM 10 MG PO TABS: 10.0000 mg | ORAL_TABLET | Freq: Every evening | ORAL | 0 refills | Status: DC

## 2024-04-13 NOTE — Progress Notes (Signed)
 Date:  04/13/2024   Name:  Carla Green   DOB:  [birth year]   MRN:  [id number]   I connected with Carla Green on 04/13/24 via MyChart Video and verified that I am speaking with the correct person using appropriate identifiers. The limitations, risks, security and privacy concerns of performing an evaluation and management service by MyChart Video, including the higher likelihood of inaccurate diagnoses and treatments, and the availability of in person appointments were reviewed. The possible need of an additional face-to-face encounter for complete and high quality delivery of care was discussed. The patient was also made aware that there may be a patient responsible charge related to this service. The patient expressed understanding and wishes to proceed.   Provider location is in medical facility [facility] at [facility]). Patient location is at their home People involved in care of the patient during this telehealth encounter were myself, my CMA, and my front office/scheduling team member.    Chief Complaint: chronic conditions  HPI [name] presents virtually today for 60-month follow-up on chronic conditions:  Last visit we discussed chronic constipation, she has been eating more fruits and vegetables and also taking an OTC supplement along with MiraLAX, seems satisfied with this regimen.  Has tried Miralax, Dulcolax, mag citrate, stool softeners, and even some prescription medications.  Long-term use of alprazolam  which she uses for sleep.  She takes it every night, usually 0.25 mg unless she has a 12-hour shift in which case she takes 0.5 mg.  Reports good efficacy with no side effects.  Previously tried and failed trazodone, Ambien, Lunesta, Unisom, melatonin, hydroxyzine.  Worsening chronic intermittent chest pain, nonexertional, known PACs documented last visit and advised to take metoprolol  daily instead of PRN. Has previously seen cardiology Dr.  Gollan with most recent visit July 2024.  History of very high LDL up to 201, currently using Zetia  for this.  Tried rosuvastatin  in the past but did not tolerate this due to muscle cramps in the legs; has not tried any other statins.   Medication list has been reviewed and updated.  Current Meds  Medication Sig   acetaminophen  (TYLENOL ) 325 MG tablet Take 325 mg by mouth every 4 (four) hours as needed.   ALPRAZolam  (XANAX ) 0.5 MG tablet Take 0.5-1 tablets (0.25-0.5 mg total) by mouth at bedtime.   estradiol  (VIVELLE -DOT) 0.1 MG/24HR patch Place 1 patch (0.1 mg total) onto the skin 2 (two) times a week.   ezetimibe  (ZETIA ) 10 MG tablet Take 1 tablet (10 mg total) by mouth daily.   fexofenadine (ALLEGRA) 180 MG tablet Take 180 mg by mouth daily.   fluticasone  (FLONASE ) 50 MCG/ACT nasal spray Place 2 sprays into both nostrils daily as needed for allergies or rhinitis.   ibuprofen  (ADVIL ,MOTRIN ) 200 MG tablet Take 600 mg by mouth every 6 (six) hours as needed.   metoprolol  succinate (TOPROL -XL) 50 MG 24 hr tablet Take 1 tablet (50 mg total) by mouth daily. Take with or immediately following a meal.   pravastatin (PRAVACHOL) 10 MG tablet Take 1 tablet (10 mg total) by mouth at bedtime.     Review of Systems  Patient Active Problem List   Diagnosis Date Noted   Premature atrial contractions 01/11/2024   Cystitis 05/11/2020   Chronic idiopathic constipation 03/29/2020   Primary insomnia 03/29/2020   Vitamin D  deficiency 03/30/2019   Tinnitus aurium, bilateral 02/15/2019   HSV-1 infection 02/15/2019   Eustachian tube disorder, bilateral 02/15/2019  Generalized anxiety disorder 09/23/2018   Anemia 02/11/2018   Arthritis of left hip 12/09/2016   Osteopenia determined by x-ray 12/09/2016   Mixed hyperlipidemia 10/28/2016   Neuropathic pain, leg, bilateral 07/14/2016   Environmental and seasonal allergies 07/14/2016   Intermittent left-sided chest pain 08/15/2014   Essential hypertension  08/15/2014   Irritable bowel syndrome with constipation 06/27/2014   Neuritis or radiculitis due to rupture of lumbar intervertebral disc 06/27/2014   Beat, premature ventricular 06/27/2014   Phlebectasia 06/27/2014    Allergies  Allergen Reactions   Codeine Other (See Comments) and Itching   Codeine Sulfate Itching   Duloxetine Nausea Only    Immunization History  Administered Date(s) Administered   Influenza-Unspecified 09/18/2015, 07/27/2018, 11/25/2023   Tdap 12/19/2011    Past Surgical History:  Procedure Laterality Date   ABDOMINAL HYSTERECTOMY     BREAST ENHANCEMENT SURGERY     COLONOSCOPY     COLONOSCOPY WITH PROPOFOL  N/A 01/11/2016   Procedure: COLONOSCOPY WITH PROPOFOL ;  Surgeon: Stephens Eis, MD;  Location: [facility] ENDOSCOPY;  Service: Gastroenterology;  Laterality: N/A;   ESOPHAGOGASTRODUODENOSCOPY  2013   normal   ESOPHAGOGASTRODUODENOSCOPY (EGD) WITH PROPOFOL  N/A 01/11/2016   Procedure: ESOPHAGOGASTRODUODENOSCOPY (EGD) WITH PROPOFOL ;  Surgeon: Stephens Eis, MD;  Location: [facility] ENDOSCOPY;  Service: Gastroenterology;  Laterality: N/A;   eye brow surgery     HERNIA REPAIR     femoral   HYSTEROSCOPY WITH D & C N/A 05/07/2015   Procedure: DILATATION AND CURETTAGE /HYSTEROSCOPY;  Surgeon: Prescilla Brod, MD;  Location: [facility] ORS;  Service: Gynecology;  Laterality: N/A;   LAPAROSCOPIC TOTAL HYSTERECTOMY     leg vein     TONSILLECTOMY     VARICOSE VEIN SURGERY     laser    Social History   Tobacco Use   Smoking status: Never   Smokeless tobacco: Never  Vaping Use   Vaping status: Never Used  Substance Use Topics   Alcohol use: No    Alcohol/week: 0.0 standard drinks of alcohol   Drug use: No    Family History  Problem Relation Age of Onset   Diabetes Father    Cancer Father    Heart attack Father        18   Heart disease Father        CABG   Colon cancer Father    Heart attack Paternal Uncle 7   Heart disease Paternal Uncle        CABG x 4    Heart  attack Paternal Grandmother 67   Breast cancer Neg Hx         04/13/2024    1:28 PM 01/11/2024   10:12 AM 09/23/2018   10:50 AM 08/09/2018    2:54 PM  GAD 7 : Generalized Anxiety Score  Nervous, Anxious, on Edge 0 0 0 3  Control/stop worrying 0 0 0 3  Worry too much - different things 0 0 0 3  Trouble relaxing 0 0 0 3  Restless 0 0 0 3  Easily annoyed or irritable 0 0 0 3  Afraid - awful might happen 0 0 0 3  Total GAD 7 Score 0 0 0 21  Anxiety Difficulty Not difficult at all Not difficult at all  Extremely difficult       04/13/2024    1:28 PM 01/11/2024   10:12 AM 03/29/2020    9:44 AM  Depression screen PHQ 2/9  Decreased Interest 0 0 0  Down, Depressed, Hopeless 0  0 0  PHQ - 2 Score 0 0 0  Altered sleeping   0  Tired, decreased energy   0  Change in appetite   0  Feeling bad or failure about yourself    0  Trouble concentrating   0  Moving slowly or fidgety/restless   0  Suicidal thoughts   0  PHQ-9 Score   0  Difficult doing work/chores   Not difficult at all    BP Readings from Last 3 Encounters:  01/11/24 122/66  05/26/23 (!) 160/68  03/29/20 124/66    Wt Readings from Last 3 Encounters:  01/11/24 129 lb (58.5 kg)  05/26/23 130 lb 4 oz (59.1 kg)  03/29/20 128 lb (58.1 kg)    Ht 5\' 2"  (1.575 m)   LMP 04/05/2014   BMI 23.59 kg/m   Physical Exam General: Speaking full sentences, no audible heavy breathing. Sounds alert and appropriately interactive. Well-appearing. Face symmetric. Extraocular movements intact. Pupils equal and round. No nasal flaring or accessory muscle use visualized.  Recent Labs     Component Value Date/Time   NA 140 04/23/2023 0000   NA 139 03/10/2014 1657   K 4.1 04/23/2023 0000   K 3.9 03/10/2014 1657   CL 103 04/23/2023 0000   CL 106 03/10/2014 1657   CO2 25 (A) 04/23/2023 0000   CO2 26 03/10/2014 1657   GLUCOSE 88 03/29/2020 1033   GLUCOSE 107 (H) 07/31/2015 0509   GLUCOSE 101 (H) 03/10/2014 1657   BUN 11 04/23/2023  0000   BUN 13 03/10/2014 1657   CREATININE 0.7 04/23/2023 0000   CREATININE 0.63 03/29/2020 1033   CREATININE 0.65 03/10/2014 1657   CALCIUM  8.6 (A) 04/23/2023 0000   CALCIUM  8.6 03/10/2014 1657   PROT 6.8 03/29/2020 1033   PROT 6.9 03/10/2014 1657   ALBUMIN 4.4 04/23/2023 0000   ALBUMIN 4.6 03/29/2020 1033   ALBUMIN 3.5 03/10/2014 1657   AST 25 03/29/2020 1033   AST 30 03/10/2014 1657   ALT 34 (H) 03/29/2020 1033   ALT 21 03/10/2014 1657   ALKPHOS 56 03/29/2020 1033   ALKPHOS 44 (L) 03/10/2014 1657   BILITOT 0.6 03/29/2020 1033   BILITOT 0.3 03/10/2014 1657   GFRNONAA 98 03/29/2020 1033   GFRNONAA >60 03/10/2014 1657   GFRAA 114 03/29/2020 1033   GFRAA >60 03/10/2014 1657    Lab Results  Component Value Date   WBC 4.9 04/23/2023   HGB 12.5 04/23/2023   HCT 38 04/23/2023   MCV 92 03/29/2020   PLT 188 04/23/2023   Lab Results  Component Value Date   HGBA1C 5.4 03/29/2019   Lab Results  Component Value Date   CHOL 167 04/23/2023   HDL 57 04/23/2023   LDLCALC 95 04/23/2023   TRIG 78 04/23/2023   CHOLHDL 4.8 (H) 03/29/2020   Lab Results  Component Value Date   TSH 0.883 03/29/2020     Assessment and Plan:  Intermittent left-sided chest pain Assessment & Plan: Patient advised with worsening/more frequent left-sided chest pain, she really needs to be evaluated by her cardiologist for consideration of stress test, echo, perhaps even cardiac catheterization.  She verbalizes understanding and will make an appointment with Dr. Gollan.   Mixed hyperlipidemia Assessment & Plan: Try low-dose pravastatin  once nightly.  Will send a small prescription for this as a trial.  Plan to repeat fasting lipids next in person office visit if not already repeated by cardiology.  Patient also informed of [employer] community  research project as a potential option for free genetic testing to evaluate for FH among other conditions.   Orders: -     Pravastatin Sodium; Take 1  tablet (10 mg total) by mouth at bedtime.  Dispense: 30 tablet; Refill: 0     Return in about 3 months (around 07/14/2024) for Fasting CPE.   I discussed the above assessment and treatment plan with the patient. The patient was provided an opportunity to ask questions and all were answered. The patient agreed with the plan and demonstrated an understanding of the instructions. The patient was advised to call back or seek an in-person evaluation if the symptoms worsen or if the condition fails to improve as anticipated. I provided a total time of 15  minutes inclusive of time utilized for medical chart review, information gathering, care coordination with staff, and documentation completion.  Cody Das, PA-C, DMSc, Nutritionist [facility] [phone]

## 2024-04-13 NOTE — Assessment & Plan Note (Signed)
 Patient advised with worsening/more frequent left-sided chest pain, she really needs to be evaluated by her cardiologist for consideration of stress test, echo, perhaps even cardiac catheterization.  She verbalizes understanding and will make an appointment with Dr. Gollan.

## 2024-04-13 NOTE — Assessment & Plan Note (Addendum)
 Try low-dose pravastatin  once nightly.  Will send a small prescription for this as a trial.  Plan to repeat fasting lipids next in person office visit if not already repeated by cardiology.  Patient also informed of [employer] project as a potential option for free genetic testing to evaluate for FH among other conditions.

## 2024-04-14 ENCOUNTER — Other Ambulatory Visit: Payer: Self-pay

## 2024-04-15 ENCOUNTER — Other Ambulatory Visit: Payer: Self-pay

## 2024-04-18 ENCOUNTER — Other Ambulatory Visit: Payer: Self-pay

## 2024-04-19 ENCOUNTER — Telehealth: Payer: Self-pay | Admitting: Cardiovascular Disease

## 2024-04-19 ENCOUNTER — Other Ambulatory Visit: Payer: Self-pay

## 2024-04-19 NOTE — Telephone Encounter (Signed)
 Continued in a separate message

## 2024-04-19 NOTE — Telephone Encounter (Signed)
 Patient calling in wanting appointment to be seen for ongoing chest pain. She saw her PCP and the EKG just showed some PAC's. Reports blood pressures have been normal in the morning but later in the day it runs in the 140/90 range. Reviewed ED precautions in detail. Scheduled her to come in and see APP. She verbalized understanding of our conversation with no further questions at this time.

## 2024-04-19 NOTE — Telephone Encounter (Signed)
   Pt c/o of Chest Pain: STAT if active CP, including tightness, pressure, jaw pain, radiating pain to shoulder/upper arm/back, CP unrelieved by Nitro. Symptoms reported of SOB, nausea, vomiting, sweating.  1. Are you having CP right now? Chest pains, not at this time    2. Are you experiencing any other symptoms (ex. SOB, nausea, vomiting, sweating)? no   3. Is your CP continuous or coming and going? Comes and goes- one time it stayed just about all day   4. Have you taken Nitroglycerin? Never had any   5. How long have you been experiencing CP? For about 2 months , getting worse, seen her PCP, ekg showed, she was having PAC's - patient wants an appointment to be seen  6. If NO CP at time of call then end call with telling Pt to call back or call 911 if Chest pain returns prior to return call from triage team.

## 2024-04-20 ENCOUNTER — Other Ambulatory Visit: Payer: Self-pay

## 2024-04-21 ENCOUNTER — Other Ambulatory Visit: Payer: Self-pay

## 2024-04-26 ENCOUNTER — Other Ambulatory Visit: Payer: Self-pay

## 2024-04-26 ENCOUNTER — Other Ambulatory Visit: Payer: Self-pay | Admitting: Physician Assistant

## 2024-04-27 ENCOUNTER — Other Ambulatory Visit: Payer: Self-pay

## 2024-04-27 MED ORDER — ESTRADIOL 0.1 MG/24HR TD PTTW
1.0000 | MEDICATED_PATCH | TRANSDERMAL | 3 refills | Status: AC
Start: 2024-04-28 — End: ?
  Filled 2024-04-27: qty 8, 28d supply, fill #0
  Filled 2024-07-06: qty 8, 28d supply, fill #1
  Filled 2024-08-15: qty 8, 28d supply, fill #2

## 2024-04-27 NOTE — Telephone Encounter (Signed)
 Please review and sign if appropriate.  JM

## 2024-04-27 NOTE — Telephone Encounter (Signed)
 Requested medication (s) are due for refill today: yes  Requested medication (s) are on the active medication list: yes  Last refill:  08/24/23  Future visit scheduled: yes  Notes to clinic:  routing for review     Requested Prescriptions  Pending Prescriptions Disp Refills   estradiol  (DOTTI ) 0.1 MG/24HR patch 24 patch 3    Sig: Place 1 patch (0.1 mg total) onto the skin 2 (two) times a week.     OB/GYN:  Estrogens Passed - 04/27/2024  3:49 PM      Passed - Mammogram is up-to-date per Health Maintenance      Passed - Last BP in normal range    BP Readings from Last 1 Encounters:  01/11/24 122/66         Passed - Valid encounter within last 12 months    Recent Outpatient Visits           2 weeks ago Intermittent left-sided chest pain   [facility] at [facility], Arleen Lacer, PA   3 months ago Ear pain, bilateral   [facility] at [facility], Arleen Lacer, PA       Future Appointments             In 2 days Morey Ar, NP [facility] at [facility]

## 2024-04-28 ENCOUNTER — Ambulatory Visit: Admitting: Student

## 2024-04-28 ENCOUNTER — Other Ambulatory Visit: Payer: Self-pay

## 2024-04-28 NOTE — Progress Notes (Deleted)
 Cardiology Clinic Note   Date: 04/28/2024 ID: Carla Green, DOB 1961/04/15, MRN [id number]  Primary Cardiologist:  None  Chief Complaint   Carla Green is a 63 y.o. female who presents to the clinic today for ***  Patient Profile   Carla Green is followed by Dr. Gollan for the history outlined below.      Past medical history significant for: Chest pain. Palpitations. Hypertension. Hyperlipidemia. Lipid panel 04/23/2023: LDL 95, HDL 57, TG 78, total 167. Anemia.  In summary, patient with history of normal stress echo performed at [facility] in March 2011.  Patient was first evaluated by Dr. Gollan on 08/15/2014 for chest pain and hypertension.  Patient reported a history of burning in her legs worsened when walking.  She also reported increased palpitations since working night shift for the prior 2 to 3 months.  She endorsed increased stress.  She reported increasing dose of metoprolol  improved palpitations.  CT cardiac scoring February 2017 showed a calcium  score of 0.  She was seen in March 2020 by Dr. Gollan and reported being very active in the gym.  She continued to have atypical chest pain with laying down.  She endorsed continued increased stress.  She was not seen again until 2024.  Patient was last seen in the office by Dr. Gollan on 05/26/2023 to reestablish care.  She reported intermittent chest pain for a couple of months.  She had stopped Crestor  secondary to muscle pain.  She was restarted on Toprol .  It was recommended that she try Zetia .  Patient contacted the office on 04/19/2024 with complaints of chest pain.  Per triage RN, Patient calling in wanting appointment to be seen for ongoing chest pain. She saw her PCP and the EKG just showed some PAC's. Reports blood pressures have been normal in the morning but later in the day it runs in the 140/90 range. Reviewed ED precautions in detail. Scheduled her to come in and see APP. She verbalized understanding of our  conversation with no further questions at this time.     History of Present Illness    Today, patient ***  Chest pain Patient*** -***  Palpitations Patient*** - Continue Toprol .  Hypertension BP today*** - Continue Toprol .  Hyperlipidemia LDL 95 June 2024, at goal. - Continue pravastatin , Zetia . - Continue to follow with PCP.  ROS: All other systems reviewed and are otherwise negative except as noted in History of Present Illness.  EKGs/Labs Reviewed        No results found for requested labs within last 365 days.   No results found for requested labs within last 365 days.   No results found for requested labs within last 365 days.   No results found for requested labs within last 365 days.  ***  Risk Assessment/Calculations    {Does this patient have ATRIAL FIBRILLATION?:[phone]} No BP recorded.  {Refresh Note OR Click here to enter BP  :1}***        Physical Exam    VS:  LMP 04/05/2014  , BMI There is no height or weight on file to calculate BMI.  GEN: Well nourished, well developed, in no acute distress. Neck: No JVD or carotid bruits. Cardiac: *** RRR. *** No murmur. No rubs or gallops.   Respiratory:  Respirations regular and unlabored. Clear to auscultation without rales, wheezing or rhonchi. GI: Soft, nontender, nondistended. Extremities: Radials/DP/PT 2+ and equal bilaterally. No clubbing or cyanosis. No edema ***  Skin: Warm and  dry, no rash. Neuro: Strength intact.  Assessment & Plan   ***  Disposition: ***     {Are you ordering a CV Procedure (e.g. stress test, cath, DCCV, TEE, etc)?   Press F2        :[id number]}   Signed, Lonell Rives. Mkenzie Dotts, DNP, NP-C

## 2024-04-29 ENCOUNTER — Ambulatory Visit: Admitting: Student

## 2024-05-05 ENCOUNTER — Other Ambulatory Visit: Payer: Self-pay

## 2024-05-05 ENCOUNTER — Encounter: Payer: Self-pay | Admitting: Medical

## 2024-05-05 ENCOUNTER — Ambulatory Visit: Attending: Medical | Admitting: Medical

## 2024-05-05 VITALS — BP 134/78 | HR 65 | Ht 62.0 in | Wt 129.6 lb

## 2024-05-05 DIAGNOSIS — I493 Ventricular premature depolarization: Secondary | ICD-10-CM | POA: Diagnosis not present

## 2024-05-05 DIAGNOSIS — I10 Essential (primary) hypertension: Secondary | ICD-10-CM | POA: Diagnosis not present

## 2024-05-05 DIAGNOSIS — Z79899 Other long term (current) drug therapy: Secondary | ICD-10-CM | POA: Diagnosis not present

## 2024-05-05 DIAGNOSIS — R072 Precordial pain: Secondary | ICD-10-CM

## 2024-05-05 DIAGNOSIS — I491 Atrial premature depolarization: Secondary | ICD-10-CM | POA: Diagnosis not present

## 2024-05-05 MED ORDER — METOPROLOL TARTRATE 50 MG PO TABS
ORAL_TABLET | ORAL | 0 refills | Status: DC
  Filled 2024-05-05: qty 1, 1d supply, fill #0

## 2024-05-05 MED ORDER — LOSARTAN POTASSIUM 25 MG PO TABS
25.0000 mg | ORAL_TABLET | Freq: Every day | ORAL | 3 refills | Status: DC
  Filled 2024-05-05: qty 30, 30d supply, fill #0

## 2024-05-05 NOTE — Progress Notes (Signed)
  Cardiology Office Note   Date:  05/05/2024  ID:  CATHI HAZAN, DOB 1960/11/18, MRN [id number] PCP: Leopoldo Rancher, PA  [facility] Providers Cardiologist:  Belva Boyden, MD   History of Present Illness ROSALIA Green is a 63 y.o. female with a history of palpitations, anxiety, s/p hysterectomy, s/p breast augmentation, constipation, on hormone therapy, and a coronary calcium  score of 0 who presents for follow-up.  Patient was seen 05/26/2023 for reestablishment as a new patient.  She reported muscle joint pain on Crestor .  No anginal symptoms.  She was started on Zetia  10 mg daily and metoprolol  50 mg daily.  Today, the patient reports high BP at night, sometimes she forgets metoprolol . Can get up to 155/88.  Chest pain comes at different times. It occurs 2-3 times weekly, but has improved in the last few weeks. It would occur when laying down. Not worse with exertion. She denies lower leg edema, lightheadedness, dizziness.   Studies Reviewed EKG Interpretation Date/Time:  Thursday May 05 2024 14:24:22 EDT Ventricular Rate:  65 PR Interval:  168 QRS Duration:  76 QT Interval:  424 QTC Calculation: 440 R Axis:   11  Text Interpretation: Normal sinus rhythm Normal ECG When compared with ECG of 26-May-2023 15:47, No significant change was found Confirmed by Gennaro Khat, Lenette Rau ([id number]) on 05/05/2024 2:46:58 PM    Cardiac score 2017 Non-cardiac: No significant non cardiac findings on limited lung and soft tissue windows. See separate report from [facility]. Ascending Aorta:  3.2 cm Pericardium: Normal Coronary arteries:  No calcium  detected IMPRESSION: Coronary calcium  score of 0.      Physical Exam VS:  BP 134/78   Pulse 65   Ht 5' 2 (1.575 m)   Wt 129 lb 9.6 oz (58.8 kg)   LMP 04/05/2014   SpO2 97%   BMI 23.70 kg/m    Wt Readings from Last 3 Encounters:  05/05/24 129 lb 9.6 oz (58.8 kg)  01/11/24 129 lb (58.5 kg)  05/26/23 130 lb 4 oz (59.1 kg)    GEN:  Well nourished, well developed in no acute distress NECK: No JVD; No carotid bruits CARDIAC: RRR, no murmurs, rubs, gallops RESPIRATORY:  Clear to auscultation without rales, wheezing or rhonchi  ABDOMEN: Soft, non-tender, non-distended EXTREMITIES:  No edema; No deformity   ASSESSMENT AND PLAN  Chest pain Chest pain more atypical although improves with ASA . Coronary calcium  score in 2017 was 0. She would like some type of testing, especially given strong family history. I will order a Cardiac CTA.   HTN She reports BP elevated at night. I will start Losartan 25mg  daily to take in the afternoon. BMET in 2 weeks. Continue Toprol  50mg  daily.   HLD LDL 95. Continue Zetia  10mg  daily.         Dispo: Follow-up in 2 months  Signed, Delainie Chavana Rebekah Canada, PA-C

## 2024-05-05 NOTE — Patient Instructions (Addendum)
 Medication Instructions:  Your physician recommends the following medication changes.  START TAKING: Losartan 25 mg by mouth every evening   *If you need a refill on your cardiac medications before your next appointment, please call your pharmacy*  Lab Work: Your provider would like for you to have following labs drawn today (BMP).     Testing/Procedures:   Your cardiac CT will be scheduled at one of the below locations:   [facility] [address] [place], [place] [address] [phone]  OR   [facility] [address] [place], [place] [address] [phone]  If scheduled at [facility] or [facility], please arrive 15 mins early for check-in and test prep.  There is spacious parking and easy access to the radiology department from the [facility] entrance. Please enter here and check-in with the desk attendant.   Please follow these instructions carefully (unless otherwise directed):  An IV will be required for this test and Nitroglycerin will be given.   On the Night Before the Test: Be sure to Drink plenty of water. Do not consume any caffeinated/decaffeinated beverages or chocolate 12 hours prior to your test. Do not take any antihistamines 12 hours prior to your test.  On the Day of the Test: Drink plenty of water until 1 hour prior to the test. Do not eat any food 1 hour prior to test. You may take your regular medications prior to the test.  Take metoprolol  (Lopressor ) 50 mg in addition to your Metoprolol  Succinate 50 mg  two hours prior to test. Patients who wear a continuous glucose monitor MUST remove the device prior to scanning. FEMALES- please wear underwire-free bra if available, avoid dresses & tight clothing       After the Test: Drink plenty of water. After receiving IV contrast, you may experience a mild flushed  feeling. This is normal. On occasion, you may experience a mild rash up to 24 hours after the test. This is not dangerous. If this occurs, you can take Benadryl 25 mg, Zyrtec, Claritin, or Allegra and increase your fluid intake. (Patients taking Tikosyn should avoid Benadryl, and may take Zyrtec, Claritin, or Allegra) If you experience trouble breathing, this can be serious. If it is severe call 911 IMMEDIATELY. If it is mild, please call our office.  We will call to schedule your test 2-4 weeks out understanding that some insurance companies will need an authorization prior to the service being performed.   For more information and frequently asked questions, please visit our website : [email or web address]  For non-scheduling related questions, please contact the cardiac imaging nurse navigator should you have any questions/concerns: Cardiac Imaging Nurse Navigators Direct Office Dial: [phone]   For scheduling needs, including cancellations and rescheduling, please call [place], [phone].    Follow-Up: At [facility], you and your health needs are our priority.  As part of our continuing mission to provide you with exceptional heart care, our providers are all part of one team.  This team includes your primary Cardiologist (physician) and Advanced Practice Providers or APPs (Physician Assistants and Nurse Practitioners) who all work together to provide you with the care you need, when you need it.  Your next appointment:   2 month(s)  Provider:   Toribio Frees, PA-C

## 2024-05-06 ENCOUNTER — Ambulatory Visit: Payer: Self-pay | Admitting: Medical

## 2024-05-06 LAB — BASIC METABOLIC PANEL WITH GFR
BUN/Creatinine Ratio: 17 (ref 12–28)
BUN: 14 mg/dL (ref 8–27)
CO2: 22 mmol/L (ref 20–29)
Calcium: 9.1 mg/dL (ref 8.7–10.3)
Chloride: 103 mmol/L (ref 96–106)
Creatinine, Ser: 0.81 mg/dL (ref 0.57–1.00)
Glucose: 88 mg/dL (ref 70–99)
Potassium: 4.2 mmol/L (ref 3.5–5.2)
Sodium: 141 mmol/L (ref 134–144)
eGFR: 82 mL/min/{1.73_m2} (ref >59)

## 2024-05-19 ENCOUNTER — Encounter (HOSPITAL_COMMUNITY): Payer: Self-pay

## 2024-05-19 ENCOUNTER — Telehealth (HOSPITAL_COMMUNITY): Payer: Self-pay | Admitting: *Deleted

## 2024-05-19 NOTE — Telephone Encounter (Signed)

## 2024-05-23 ENCOUNTER — Ambulatory Visit
Admission: RE | Admit: 2024-05-23 | Discharge: 2024-05-23 | Disposition: A | Discharge status: Home / Self Care | Source: Ambulatory Visit | Attending: Medical | Admitting: Medical

## 2024-05-23 ENCOUNTER — Other Ambulatory Visit: Payer: Self-pay

## 2024-05-23 DIAGNOSIS — R072 Precordial pain: Secondary | ICD-10-CM | POA: Insufficient documentation

## 2024-05-23 MED ORDER — METOPROLOL TARTRATE 5 MG/5ML IV SOLN
10.0000 mg | Freq: Once | INTRAVENOUS | Status: DC | PRN
  Filled 2024-05-23: qty 10

## 2024-05-23 MED ORDER — DILTIAZEM HCL 25 MG/5ML IV SOLN
10.0000 mg | INTRAVENOUS | Status: DC | PRN
  Filled 2024-05-23: qty 5

## 2024-05-23 MED ORDER — IOHEXOL 350 MG/ML SOLN
100.0000 mL | Freq: Once | INTRAVENOUS | Status: AC | PRN
  Administered 2024-05-23: 100 mL via INTRAVENOUS

## 2024-05-23 MED ORDER — NITROGLYCERIN 0.4 MG SL SUBL
0.8000 mg | SUBLINGUAL_TABLET | Freq: Once | SUBLINGUAL | Status: DC
  Filled 2024-05-23: qty 25

## 2024-05-23 MED ORDER — NITROGLYCERIN 0.4 MG SL SUBL
SUBLINGUAL_TABLET | SUBLINGUAL | Status: AC
  Filled 2024-05-23: qty 2

## 2024-05-24 ENCOUNTER — Telehealth: Payer: Self-pay | Admitting: Cardiovascular Disease

## 2024-05-24 NOTE — Telephone Encounter (Signed)
 Patient stated she gets claustrophobic when doing her CT Morph test and wants to get medication to help her do the test.

## 2024-05-24 NOTE — Telephone Encounter (Signed)
 Patient called reporting that she becomes very claustrophobic during CT procedures and is inquiring if something could be prescribed to help ease her anxiety.  Will forward to MD for recommendations

## 2024-05-25 ENCOUNTER — Other Ambulatory Visit: Payer: Self-pay

## 2024-05-25 MED ORDER — DIAZEPAM 5 MG PO TABS: 5.0000 mg | ORAL_TABLET | ORAL | 0 refills | Status: DC | PRN

## 2024-05-25 NOTE — Telephone Encounter (Signed)
 Pt called back.

## 2024-05-25 NOTE — Telephone Encounter (Signed)
 Attempted to contact pt. Left message to call back.    Furth, Cadence H, PA-C to Me (Selected Message)     05/24/24  4:53 PM Can given Valium  5mg  x 2 tablets. Can take 1 an hour beforehand as long as she has someone to drive her. Bring the other one to the scan

## 2024-05-25 NOTE — Telephone Encounter (Signed)
 Called patient, made aware of need for RX-   Will call pharmacy when reopen to give verbal order for RX.  Patient verbalized understanding.

## 2024-05-26 ENCOUNTER — Other Ambulatory Visit: Payer: Self-pay

## 2024-05-26 MED ORDER — DIAZEPAM 5 MG PO TABS
5.0000 mg | ORAL_TABLET | ORAL | 0 refills | Status: DC
  Filled 2024-05-26: qty 2, 1d supply, fill #0

## 2024-05-26 NOTE — Telephone Encounter (Signed)
 Called pharmacy to give verbal order- advised to call back if questions/concerns.

## 2024-06-01 ENCOUNTER — Telehealth (HOSPITAL_COMMUNITY): Payer: Self-pay | Admitting: Emergency Medicine

## 2024-06-01 NOTE — Telephone Encounter (Signed)
 Reaching out to patient to offer assistance regarding upcoming cardiac imaging study (patient rescheduled for 7/21); pt verbalizes understanding of appt date/time, parking situation and where to check in, pre-test NPO status and medications ordered, and verified current allergies; name and call back number provided for further questions should they arise Sid Seats RN Navigator Cardiac Imaging [facility] [phone] office [phone] cell

## 2024-06-01 NOTE — Telephone Encounter (Signed)
 Attempted to call patient regarding upcoming cardiac CT appointment. Left message on voicemail with name and callback number Rockwell Alexandria RN Navigator Cardiac Imaging [facility] [phone] Office [phone] Cell

## 2024-06-02 ENCOUNTER — Ambulatory Visit

## 2024-06-06 ENCOUNTER — Ambulatory Visit
Admission: RE | Admit: 2024-06-06 | Discharge: 2024-06-06 | Disposition: A | Discharge status: Home / Self Care | Source: Ambulatory Visit | Attending: Medical | Admitting: Medical

## 2024-06-06 DIAGNOSIS — M5416 Radiculopathy, lumbar region: Secondary | ICD-10-CM | POA: Diagnosis not present

## 2024-06-06 DIAGNOSIS — R072 Precordial pain: Secondary | ICD-10-CM | POA: Insufficient documentation

## 2024-06-06 DIAGNOSIS — M7541 Impingement syndrome of right shoulder: Secondary | ICD-10-CM | POA: Diagnosis not present

## 2024-06-06 DIAGNOSIS — M4802 Spinal stenosis, cervical region: Secondary | ICD-10-CM | POA: Diagnosis not present

## 2024-06-06 DIAGNOSIS — M5412 Radiculopathy, cervical region: Secondary | ICD-10-CM | POA: Diagnosis not present

## 2024-06-06 DIAGNOSIS — M1612 Unilateral primary osteoarthritis, left hip: Secondary | ICD-10-CM | POA: Diagnosis not present

## 2024-06-06 MED ORDER — NITROGLYCERIN 0.4 MG SL SUBL
0.8000 mg | SUBLINGUAL_TABLET | Freq: Once | SUBLINGUAL | Status: AC
  Administered 2024-06-06: 0.8 mg via SUBLINGUAL
  Filled 2024-06-06: qty 25

## 2024-06-06 MED ORDER — IOHEXOL 350 MG/ML SOLN
100.0000 mL | Freq: Once | INTRAVENOUS | Status: AC | PRN
  Administered 2024-06-06: 100 mL via INTRAVENOUS

## 2024-06-06 NOTE — Progress Notes (Signed)
 Patient tolerated CT well. Vital signs stable encourage to drink water throughout day.Reasons explained and verbalized understanding. Ambulated steady gait.

## 2024-06-20 ENCOUNTER — Ambulatory Visit (INDEPENDENT_AMBULATORY_CARE_PROVIDER_SITE_OTHER): Admitting: Physician Assistant

## 2024-06-20 ENCOUNTER — Encounter: Payer: Self-pay | Admitting: Physician Assistant

## 2024-06-20 VITALS — BP 128/82 | HR 65 | Temp 98.5°F | Ht 62.0 in | Wt 127.0 lb

## 2024-06-20 DIAGNOSIS — Z2821 Immunization not carried out because of patient refusal: Secondary | ICD-10-CM | POA: Diagnosis not present

## 2024-06-20 DIAGNOSIS — M8580 Other specified disorders of bone density and structure, unspecified site: Secondary | ICD-10-CM

## 2024-06-20 DIAGNOSIS — L570 Actinic keratosis: Secondary | ICD-10-CM | POA: Diagnosis not present

## 2024-06-20 DIAGNOSIS — Z0000 Encounter for general adult medical examination without abnormal findings: Secondary | ICD-10-CM

## 2024-06-20 NOTE — Progress Notes (Signed)
 Date:  06/20/2024   Name:  Carla Green   DOB:  12-01-1960   MRN:  [id number]   Chief Complaint: Annual Exam and Hypertension (Losartan  not taking it, runny nose constantly /)  HPI [name] presents today for routine physical with no particular concerns.  Last Dental Exam: 10m ago Last Eye Exam: 18m ago Last CRC screen: 2021, 5y repeat Last Mammo: 05/11/23 normal Last Pap: s/p hysterectomy  Last DEXA: 2020 osteopenia Immunizations Due: Tdap, Prevnar20, Shingrix     Medication list has been reviewed and updated.  Current Meds  Medication Sig   acetaminophen  (TYLENOL ) 325 MG tablet Take 325 mg by mouth every 4 (four) hours as needed.   ALPRAZolam  (XANAX ) 0.5 MG tablet Take 0.5-1 tablets (0.25-0.5 mg total) by mouth at bedtime.   estradiol  (DOTTI ) 0.1 MG/24HR patch Place 1 patch (0.1 mg total) onto the skin 2 (two) times a week.   ezetimibe  (ZETIA ) 10 MG tablet Take 1 tablet (10 mg total) by mouth daily. (Patient taking differently: Take 5 mg by mouth daily.)   fexofenadine (ALLEGRA) 180 MG tablet Take 180 mg by mouth daily.   fluticasone  (FLONASE ) 50 MCG/ACT nasal spray Place 2 sprays into both nostrils daily as needed for allergies or rhinitis.   ibuprofen  (ADVIL ,MOTRIN ) 200 MG tablet Take 600 mg by mouth every 6 (six) hours as needed.   metoprolol  succinate (TOPROL -XL) 50 MG 24 hr tablet Take 1 tablet (50 mg total) by mouth daily. Take with or immediately following a meal.   metoprolol  tartrate (LOPRESSOR ) 50 MG tablet Take 1 tablet (50 mg) by mouth 2 hours prior to cardiac procedure.   pravastatin  (PRAVACHOL ) 10 MG tablet Take 1 tablet (10 mg total) by mouth at bedtime.   UNABLE TO FIND Med Name: [place]- for GI problems   [DISCONTINUED] diazepam  (VALIUM ) 5 MG tablet Take 1 tablet (5 mg total) by mouth as needed for anxiety. Take 1 hour before scan, and at the scan     Review of Systems  Patient Active Problem List   Diagnosis Date Noted   Actinic keratosis 06/20/2024    Premature atrial contractions 01/11/2024   Cystitis 05/11/2020   Chronic idiopathic constipation 03/29/2020   Primary insomnia 03/29/2020   Vitamin D  deficiency 03/30/2019   Tinnitus aurium, bilateral 02/15/2019   HSV-1 infection 02/15/2019   Eustachian tube disorder, bilateral 02/15/2019   Generalized anxiety disorder 09/23/2018   Anemia 02/11/2018   Arthritis of left hip 12/09/2016   Osteopenia determined by x-ray 12/09/2016   Mixed hyperlipidemia 10/28/2016   Neuropathic pain, leg, bilateral 07/14/2016   Environmental and seasonal allergies 07/14/2016   Intermittent left-sided chest pain 08/15/2014   Essential hypertension 08/15/2014   Irritable bowel syndrome with constipation 06/27/2014   Neuritis or radiculitis due to rupture of lumbar intervertebral disc 06/27/2014   Beat, premature ventricular 06/27/2014   Phlebectasia 06/27/2014    Allergies  Allergen Reactions   Codeine Other (See Comments) and Itching   Codeine Sulfate Itching   Duloxetine Nausea Only    Immunization History  Administered Date(s) Administered   Influenza-Unspecified 09/18/2015, 07/27/2018, 11/25/2023   Tdap 12/19/2011    Past Surgical History:  Procedure Laterality Date   ABDOMINAL HYSTERECTOMY     BREAST ENHANCEMENT SURGERY     COLONOSCOPY     COLONOSCOPY WITH PROPOFOL  N/A 01/11/2016   Procedure: COLONOSCOPY WITH PROPOFOL ;  Surgeon: Deward CINDERELLA Piedmont, MD;  Location: [facility] ENDOSCOPY;  Service: Gastroenterology;  Laterality: N/A;   ESOPHAGOGASTRODUODENOSCOPY  2013  normal   ESOPHAGOGASTRODUODENOSCOPY (EGD) WITH PROPOFOL  N/A 01/11/2016   Procedure: ESOPHAGOGASTRODUODENOSCOPY (EGD) WITH PROPOFOL ;  Surgeon: Deward CINDERELLA Piedmont, MD;  Location: [facility] ENDOSCOPY;  Service: Gastroenterology;  Laterality: N/A;   eye brow surgery     HERNIA REPAIR     femoral   HYSTEROSCOPY WITH D & C N/A 05/07/2015   Procedure: DILATATION AND CURETTAGE /HYSTEROSCOPY;  Surgeon: Heather Penton, MD;  Location: [facility] ORS;  Service:  Gynecology;  Laterality: N/A;   LAPAROSCOPIC TOTAL HYSTERECTOMY     leg vein     TONSILLECTOMY     VARICOSE VEIN SURGERY     laser    Social History   Tobacco Use   Smoking status: Never   Smokeless tobacco: Never  Vaping Use   Vaping status: Never Used  Substance Use Topics   Alcohol use: No    Alcohol/week: 0.0 standard drinks of alcohol   Drug use: No    Family History  Problem Relation Age of Onset   Diabetes Father    Cancer Father    Heart attack Father        55   Heart disease Father        CABG   Colon cancer Father    Heart attack Paternal Uncle 66   Heart disease Paternal Uncle        CABG x 4    Heart attack Paternal Grandmother 44   Breast cancer Neg Hx         06/20/2024   10:07 AM 04/13/2024    1:28 PM 01/11/2024   10:12 AM 09/23/2018   10:50 AM  GAD 7 : Generalized Anxiety Score  Nervous, Anxious, on Edge 0 0 0 0  Control/stop worrying 0 0 0 0  Worry too much - different things 0 0 0 0  Trouble relaxing 0 0 0 0  Restless 0 0 0 0  Easily annoyed or irritable 0 0 0 0  Afraid - awful might happen 0 0 0 0  Total GAD 7 Score 0 0 0 0  Anxiety Difficulty Not difficult at all Not difficult at all Not difficult at all        06/20/2024   10:07 AM 04/13/2024    1:28 PM 01/11/2024   10:12 AM  Depression screen PHQ 2/9  Decreased Interest 0 0 0  Down, Depressed, Hopeless 0 0 0  PHQ - 2 Score 0 0 0    BP Readings from Last 3 Encounters:  06/20/24 128/82  06/06/24 (!) 160/79  05/23/24 (!) 158/52    Wt Readings from Last 3 Encounters:  06/20/24 127 lb (57.6 kg)  05/05/24 129 lb 9.6 oz (58.8 kg)  01/11/24 129 lb (58.5 kg)    BP 128/82   Pulse 65   Temp 98.5 F (36.9 C)   Ht 5' 2 (1.575 m)   Wt 127 lb (57.6 kg)   LMP 04/05/2014   SpO2 98%   BMI 23.23 kg/m   Physical Exam Vitals and nursing note reviewed. Exam conducted with a chaperone present.  Constitutional:      Appearance: Normal appearance. She is well-groomed.  HENT:      Ears:     Comments: EAC clear bilaterally with good view of TM which is without effusion or erythema.     Nose: Nose normal.     Mouth/Throat:     Mouth: Mucous membranes are moist. No oral lesions.     Dentition: Normal dentition.     Pharynx:  Uvula midline. No posterior oropharyngeal erythema.  Eyes:     General: Vision grossly intact.     Extraocular Movements: Extraocular movements intact.     Conjunctiva/sclera: Conjunctivae normal.     Pupils: Pupils are equal, round, and reactive to light.  Neck:     Thyroid: No thyroid mass or thyromegaly.  Cardiovascular:     Rate and Rhythm: Normal rate and regular rhythm.     Heart sounds: S1 normal and S2 normal. No murmur heard.    No friction rub. No gallop.     Comments: Pulses 2+ at radial, PT, DP bilaterally. No carotid bruit. No peripheral edema Pulmonary:     Effort: Pulmonary effort is normal.     Breath sounds: Normal breath sounds.  Chest:     Comments: Deferred Abdominal:     General: Bowel sounds are normal.     Palpations: Abdomen is soft. There is no mass.     Tenderness: There is no abdominal tenderness.  Musculoskeletal:     Comments: Full ROM with strength 5/5 bilateral upper and lower extremities  Lymphadenopathy:     Cervical: No cervical adenopathy.  Skin:    General: Skin is warm.     Capillary Refill: Capillary refill takes less than 2 seconds.     Findings: No rash.     Comments: 2-3 mm dry scaly macule of right side face, pruritic.  Neurological:     Mental Status: She is alert and oriented to person, place, and time.     Cranial Nerves: Cranial nerves 2-12 are intact.     Gait: Gait is intact.  Psychiatric:        Mood and Affect: Mood and affect normal.        Behavior: Behavior normal.     Recent Labs     Component Value Date/Time   NA 141 05/05/2024 1513   NA 139 03/10/2014 1657   K 4.2 05/05/2024 1513   K 3.9 03/10/2014 1657   CL 103 05/05/2024 1513   CL 106 03/10/2014 1657   CO2 22  05/05/2024 1513   CO2 26 03/10/2014 1657   GLUCOSE 88 05/05/2024 1513   GLUCOSE 107 (H) 07/31/2015 0509   GLUCOSE 101 (H) 03/10/2014 1657   BUN 14 05/05/2024 1513   BUN 13 03/10/2014 1657   CREATININE 0.81 05/05/2024 1513   CREATININE 0.65 03/10/2014 1657   CALCIUM  9.1 05/05/2024 1513   CALCIUM  8.6 03/10/2014 1657   PROT 6.8 03/29/2020 1033   PROT 6.9 03/10/2014 1657   ALBUMIN 4.4 04/23/2023 0000   ALBUMIN 4.6 03/29/2020 1033   ALBUMIN 3.5 03/10/2014 1657   AST 25 03/29/2020 1033   AST 30 03/10/2014 1657   ALT 34 (H) 03/29/2020 1033   ALT 21 03/10/2014 1657   ALKPHOS 56 03/29/2020 1033   ALKPHOS 44 (L) 03/10/2014 1657   BILITOT 0.6 03/29/2020 1033   BILITOT 0.3 03/10/2014 1657   GFRNONAA 98 03/29/2020 1033   GFRNONAA >60 03/10/2014 1657   GFRAA 114 03/29/2020 1033   GFRAA >60 03/10/2014 1657    Lab Results  Component Value Date   WBC 4.9 04/23/2023   HGB 12.5 04/23/2023   HCT 38 04/23/2023   MCV 92 03/29/2020   PLT 188 04/23/2023   Lab Results  Component Value Date   HGBA1C 5.4 03/29/2019   HGBA1C 5.3 02/11/2018   HGBA1C 5.2 12/09/2016   Lab Results  Component Value Date   CHOL 167 04/23/2023   HDL 57  04/23/2023   LDLCALC 95 04/23/2023   TRIG 78 04/23/2023   CHOLHDL 4.8 (H) 03/29/2020   Lab Results  Component Value Date   TSH 0.883 03/29/2020      Assessment and Plan:  1. Annual physical exam (Primary) Encouraged healthy lifestyle including regular physical activity and consumption of whole fruits and vegetables. Encouraged routine dental and eye exams.   Patient would like to defer her mammogram until next year, which I think is reasonable.  - CBC with Differential/Platelet - Comprehensive metabolic panel with GFR - TSH - Lipid panel - VITAMIN D  25 Hydroxy (Vit-D Deficiency, Fractures)  2. Osteopenia determined by x-ray Ordering repeat DEXA as well as vitamin D  - DG Bone Density  3. Actinic keratosis Patient to consult dermatology where she  is already established  4. Immunization declined Discussed vaccines but deferred today.    Return in about 3 months (around 09/20/2024) for virtual OV alprazolam .    Rolan Hoyle, PA-C, DMSc, Nutritionist [facility] [phone]

## 2024-06-21 ENCOUNTER — Other Ambulatory Visit: Payer: Self-pay | Admitting: Physician Assistant

## 2024-06-21 ENCOUNTER — Other Ambulatory Visit: Payer: Self-pay

## 2024-06-21 ENCOUNTER — Ambulatory Visit: Payer: Self-pay | Admitting: Physician Assistant

## 2024-06-21 DIAGNOSIS — E782 Mixed hyperlipidemia: Secondary | ICD-10-CM

## 2024-06-21 LAB — COMPREHENSIVE METABOLIC PANEL WITH GFR
ALT: 24 IU/L (ref 0–32)
AST: 19 IU/L (ref 0–40)
Albumin: 4.6 g/dL (ref 3.9–4.9)
Alkaline Phosphatase: 53 IU/L (ref 44–121)
BUN/Creatinine Ratio: 17 (ref 12–28)
BUN: 13 mg/dL (ref 8–27)
Bilirubin Total: 0.7 mg/dL (ref 0.0–1.2)
CO2: 22 mmol/L (ref 20–29)
Calcium: 9.2 mg/dL (ref 8.7–10.3)
Chloride: 102 mmol/L (ref 96–106)
Creatinine, Ser: 0.75 mg/dL (ref 0.57–1.00)
Globulin, Total: 2.1 g/dL (ref 1.5–4.5)
Glucose: 90 mg/dL (ref 70–99)
Potassium: 4.2 mmol/L (ref 3.5–5.2)
Sodium: 140 mmol/L (ref 134–144)
Total Protein: 6.7 g/dL (ref 6.0–8.5)
eGFR: 89 mL/min/1.73 (ref >59)

## 2024-06-21 LAB — CBC WITH DIFFERENTIAL/PLATELET
Basophils Absolute: 0 x10E3/uL (ref 0.0–0.2)
Basos: 0 %
EOS (ABSOLUTE): 0.1 x10E3/uL (ref 0.0–0.4)
Eos: 2 %
Hematocrit: 42.3 % (ref 34.0–46.6)
Hemoglobin: 14 g/dL (ref 11.1–15.9)
Immature Grans (Abs): 0 x10E3/uL (ref 0.0–0.1)
Immature Granulocytes: 0 %
Lymphocytes Absolute: 1.4 x10E3/uL (ref 0.7–3.1)
Lymphs: 20 %
MCH: 31.5 pg (ref 26.6–33.0)
MCHC: 33.1 g/dL (ref 31.5–35.7)
MCV: 95 fL (ref 79–97)
Monocytes Absolute: 0.4 x10E3/uL (ref 0.1–0.9)
Monocytes: 5 %
Neutrophils Absolute: 5.1 x10E3/uL (ref 1.4–7.0)
Neutrophils: 73 %
Platelets: 248 x10E3/uL (ref 150–450)
RBC: 4.45 x10E6/uL (ref 3.77–5.28)
RDW: 12 % (ref 11.7–15.4)
WBC: 7 x10E3/uL (ref 3.4–10.8)

## 2024-06-21 LAB — LIPID PANEL
Chol/HDL Ratio: 3.2 ratio (ref 0.0–4.4)
Cholesterol, Total: 194 mg/dL (ref 100–199)
HDL: 60 mg/dL (ref >39)
LDL Chol Calc (NIH): 118 mg/dL — ABNORMAL HIGH (ref 0–99)
Triglycerides: 91 mg/dL (ref 0–149)
VLDL Cholesterol Cal: 16 mg/dL (ref 5–40)

## 2024-06-21 LAB — TSH: TSH: 0.995 u[IU]/mL (ref 0.450–4.500)

## 2024-06-21 LAB — VITAMIN D 25 HYDROXY (VIT D DEFICIENCY, FRACTURES): Vit D, 25-Hydroxy: 41.4 ng/mL (ref 30.0–100.0)

## 2024-06-21 MED ORDER — PRAVASTATIN SODIUM 20 MG PO TABS
20.0000 mg | ORAL_TABLET | Freq: Every evening | ORAL | 1 refills | Status: DC
  Filled 2024-06-21: qty 30, 30d supply, fill #0

## 2024-06-28 ENCOUNTER — Other Ambulatory Visit: Payer: Self-pay

## 2024-06-28 ENCOUNTER — Other Ambulatory Visit: Payer: Self-pay | Admitting: Physician Assistant

## 2024-06-28 DIAGNOSIS — F419 Anxiety disorder, unspecified: Secondary | ICD-10-CM

## 2024-06-30 ENCOUNTER — Other Ambulatory Visit: Payer: Self-pay

## 2024-06-30 NOTE — Telephone Encounter (Signed)
 Requested medication (s) are due for refill today: yes  Requested medication (s) are on the active medication list: yes  Last refill:  01/21/24 # 60 with 1 refill   Future visit scheduled: yes   Notes to clinic:  Please review for refill. Medication not delegated per protocol    Requested Prescriptions  Pending Prescriptions Disp Refills   ALPRAZolam  (XANAX ) 0.5 MG tablet 60 tablet 1    Sig: Take 0.5-1 tablets (0.25-0.5 mg total) by mouth at bedtime.     Not Delegated - Psychiatry: Anxiolytics/Hypnotics 2 Failed - 06/30/2024  5:10 PM      Failed - This refill cannot be delegated      Failed - Urine Drug Screen completed in last 360 days      Passed - Patient is not pregnant      Passed - Valid encounter within last 6 months    Recent Outpatient Visits           1 week ago Annual physical exam   [facility] at [facility], Toribio SQUIBB, [place]   2 months ago Intermittent left-sided chest pain   [facility] at [facility], Toribio SQUIBB, [place]   5 months ago Ear pain, bilateral   [facility] at [facility], Toribio SQUIBB, [place]       Future Appointments             In 1 week Hester Alm BROCKS, MD [facility]   In 3 weeks Franchester, Cadence H, PA-C [facility] [place]   In 2 months [place], Toribio SQUIBB, [place] [facility] Medicine at [facility], [facility]

## 2024-07-01 ENCOUNTER — Other Ambulatory Visit: Payer: Self-pay

## 2024-07-01 MED FILL — Alprazolam Tab 0.5 MG: ORAL | 30 days supply | Qty: 30 | Fill #0 | Status: AC

## 2024-07-01 NOTE — Telephone Encounter (Signed)
 Please review.  KP

## 2024-07-04 ENCOUNTER — Other Ambulatory Visit: Payer: Self-pay

## 2024-07-06 ENCOUNTER — Other Ambulatory Visit: Payer: Self-pay | Admitting: Medical

## 2024-07-06 ENCOUNTER — Other Ambulatory Visit: Payer: Self-pay

## 2024-07-07 ENCOUNTER — Other Ambulatory Visit: Payer: Self-pay

## 2024-07-07 ENCOUNTER — Ambulatory Visit: Admitting: Dermatology

## 2024-07-07 ENCOUNTER — Other Ambulatory Visit: Payer: Self-pay | Admitting: Cardiovascular Disease

## 2024-07-07 ENCOUNTER — Other Ambulatory Visit: Payer: Self-pay | Admitting: Physician Assistant

## 2024-07-07 ENCOUNTER — Encounter: Payer: Self-pay | Admitting: Dermatology

## 2024-07-07 DIAGNOSIS — L820 Inflamed seborrheic keratosis: Secondary | ICD-10-CM

## 2024-07-07 DIAGNOSIS — L578 Other skin changes due to chronic exposure to nonionizing radiation: Secondary | ICD-10-CM

## 2024-07-07 DIAGNOSIS — W908XXA Exposure to other nonionizing radiation, initial encounter: Secondary | ICD-10-CM

## 2024-07-07 DIAGNOSIS — L570 Actinic keratosis: Secondary | ICD-10-CM | POA: Diagnosis not present

## 2024-07-07 DIAGNOSIS — L814 Other melanin hyperpigmentation: Secondary | ICD-10-CM

## 2024-07-07 DIAGNOSIS — L918 Other hypertrophic disorders of the skin: Secondary | ICD-10-CM

## 2024-07-07 DIAGNOSIS — Z1283 Encounter for screening for malignant neoplasm of skin: Secondary | ICD-10-CM | POA: Diagnosis not present

## 2024-07-07 DIAGNOSIS — L209 Atopic dermatitis, unspecified: Secondary | ICD-10-CM

## 2024-07-07 DIAGNOSIS — D1801 Hemangioma of skin and subcutaneous tissue: Secondary | ICD-10-CM | POA: Diagnosis not present

## 2024-07-07 DIAGNOSIS — L821 Other seborrheic keratosis: Secondary | ICD-10-CM | POA: Diagnosis not present

## 2024-07-07 DIAGNOSIS — Z7189 Other specified counseling: Secondary | ICD-10-CM

## 2024-07-07 DIAGNOSIS — L719 Rosacea, unspecified: Secondary | ICD-10-CM

## 2024-07-07 DIAGNOSIS — L2089 Other atopic dermatitis: Secondary | ICD-10-CM

## 2024-07-07 DIAGNOSIS — D229 Melanocytic nevi, unspecified: Secondary | ICD-10-CM

## 2024-07-07 DIAGNOSIS — L905 Scar conditions and fibrosis of skin: Secondary | ICD-10-CM | POA: Diagnosis not present

## 2024-07-07 DIAGNOSIS — Z79899 Other long term (current) drug therapy: Secondary | ICD-10-CM

## 2024-07-07 MED ORDER — EUCRISA 2 % EX OINT
TOPICAL_OINTMENT | CUTANEOUS | 5 refills | Status: AC
Start: 2024-07-07 — End: ?

## 2024-07-07 MED ORDER — HALOBETASOL PROPIONATE 0.05 % EX FOAM
CUTANEOUS | 1 refills | Status: AC
Start: 2024-07-07 — End: ?

## 2024-07-07 NOTE — Progress Notes (Signed)
 Follow-Up Visit   Subjective  Carla Green is a 63 y.o. female who presents for the following: Skin Cancer Screening and Full Body Skin Exam  The patient presents for Total-Body Skin Exam (TBSE) for skin cancer screening and mole check. The patient has spots, moles and lesions to be evaluated, some may be new or changing and the patient may have concern these could be cancer.  The following portions of the chart were reviewed this encounter and updated as appropriate: medications, allergies, medical history  Review of Systems:  No other skin or systemic complaints except as noted in HPI or Assessment and Plan.  Objective  Well appearing patient in no apparent distress; mood and affect are within normal limits.  A full examination was performed including scalp, head, eyes, ears, nose, lips, neck, chest, axillae, abdomen, back, buttocks, bilateral upper extremities, bilateral lower extremities, hands, feet, fingers, toes, fingernails, and toenails. All findings within normal limits unless otherwise noted below.   Relevant physical exam findings are noted in the Assessment and Plan.  L nose x 1 Erythematous thin papules/macules with gritty scale.  R cheek x 1, R forearm x 2 (3) Erythematous stuck-on, waxy papule or plaque  Assessment & Plan   SKIN CANCER SCREENING PERFORMED TODAY.  ACTINIC DAMAGE - Chronic condition, secondary to cumulative UV/sun exposure - diffuse scaly erythematous macules with underlying dyspigmentation - Recommend daily broad spectrum sunscreen SPF 30+ to sun-exposed areas, reapply every 2 hours as needed.  - Staying in the shade or wearing long sleeves, sun glasses (UVA+UVB protection) and wide brim hats (4-inch brim around the entire circumference of the hat) are also recommended for sun protection.  - Call for new or changing lesions.  LENTIGINES, SEBORRHEIC KERATOSES, HEMANGIOMAS - Benign normal skin lesions - Benign-appearing - Call for any  changes  MELANOCYTIC NEVI - Tan-brown and/or pink-flesh-colored symmetric macules and papules - Benign appearing on exam today - Observation - Call clinic for new or changing moles - Recommend daily use of broad spectrum spf 30+ sunscreen to sun-exposed areas.  ROSACEA Exam Mid face erythema with telangiectasias +/-  Rosacea is a chronic progressive skin condition usually affecting the face of adults, causing redness and/or acne bumps. It is treatable but not curable. It sometimes affects the eyes (ocular rosacea) as well. It may respond to topical and/or systemic medication and can flare with stress, sun exposure, alcohol, exercise, topical steroids (including hydrocortisone/cortisone 10) and some foods.  Daily application of broad spectrum spf 30+ sunscreen to face is recommended to reduce flares. Treatment Plan Counseling for BBL / IPL / Laser and Coordination of Care Discussed the treatment option of Broad Band Light (BBL) /Intense Pulsed Light (IPL)/ Laser for skin discoloration, including brown spots and redness.  Typically we recommend at least 1-3 treatment sessions about 5-8 weeks apart for best results.  Cannot have tanned skin when BBL performed, and regular use of sunscreen/photoprotection is advised after the procedure to help maintain results. The patient's condition may also require maintenance treatments in the future.  The fee for BBL / laser treatments is $350 per treatment session for the whole face.  A fee can be quoted for other parts of the body.  Insurance typically does not pay for BBL/laser treatments and therefore the fee is an out-of-pocket cost. Recommend prophylactic valtrex  treatment. Once scheduled for procedure, will send Rx in prior to patient's appointment.  Pt may consider BBL.  AK (ACTINIC KERATOSIS) L nose x 1 Actinic keratoses are precancerous  spots that appear secondary to cumulative UV radiation exposure/sun exposure over time. They are chronic with  expected duration over 1 year. A portion of actinic keratoses will progress to squamous cell carcinoma of the skin. It is not possible to reliably predict which spots will progress to skin cancer and so treatment is recommended to prevent development of skin cancer.  Recommend daily broad spectrum sunscreen SPF 30+ to sun-exposed areas, reapply every 2 hours as needed.  Recommend staying in the shade or wearing long sleeves, sun glasses (UVA+UVB protection) and wide brim hats (4-inch brim around the entire circumference of the hat). Call for new or changing lesions.  Destruction of lesion - L nose x 1 Complexity: simple   Destruction method: cryotherapy   Informed consent: discussed and consent obtained   Timeout:  patient name, date of birth, surgical site, and procedure verified Lesion destroyed using liquid nitrogen: Yes   Region frozen until ice ball extended beyond lesion: Yes   Outcome: patient tolerated procedure well with no complications   Post-procedure details: wound care instructions given    INFLAMED SEBORRHEIC KERATOSIS (3) R cheek x 1, R forearm x 2 (3) Symptomatic, irritating, patient would like treated.  Destruction of lesion - R cheek x 1, R forearm x 2 (3) Complexity: simple   Destruction method: cryotherapy   Informed consent: discussed and consent obtained   Timeout:  patient name, date of birth, surgical site, and procedure verified Lesion destroyed using liquid nitrogen: Yes   Region frozen until ice ball extended beyond lesion: Yes   Outcome: patient tolerated procedure well with no complications   Post-procedure details: wound care instructions given     ATOPIC DERMATITIS (Less likely Psoriasis) Exam: faint pink texture change on the post neck 2% BSA Chronic and persistent condition with duration or expected duration over one year. Condition is bothersome/symptomatic for patient. Currently flared. Atopic dermatitis (eczema) is a chronic, relapsing,  pruritic condition that can significantly affect quality of life. It is often associated with allergic rhinitis and/or asthma and can require treatment with topical medications, phototherapy, or in severe cases biologic injectable medication (Dupixent; Adbry) or Oral JAK inhibitors. Treatment Plan: Continue Lexette  foam apply to aa BID up to 5d/wk prn rash.  Topical steroids (such as triamcinolone , fluocinolone, fluocinonide, mometasone, clobetasol, halobetasol , betamethasone , hydrocortisone) can cause thinning and lightening of the skin if they are used for too long in the same area. Your physician has selected the right strength medicine for your problem and area affected on the body. Please use your medication only as directed by your physician to prevent side effects.   Start Eucrisa  2% ointment apply to aa every day- bid 7 dayser per week prn rash.  Apply over top of Lexette  foam on 5 days per week when Lexette  used.  Recommend gentle skin care. Long term medication management.  Patient is using long term (months to years) prescription medication  to control their dermatologic condition.  These medications require periodic monitoring to evaluate for efficacy and side effects and may require periodic laboratory monitoring.  SCAR from acne bump. Within deep marionette line Exam: Dyspigmented smooth macule or patch R oral commissure.  Benign-appearing.  Observation.  Call clinic for new or changing lesions. Recommend daily broad spectrum sunscreen SPF 30+, reapply every 2 hours as needed. Treatment: Recommend Serica moisturizing scar formula cream every night or [employer] brand or Mederma silicone scar sheet every night for the first year after a scar appears to help with scar remodeling  if desired. Scars remodel on their own for a full year and will gradually improve in appearance over time. Hyaluronic acid filler for Marionette line would also make mild acne scar lees visible.  FACIAL  ELASTOSIS Exam: Rhytides and volume loss. Treatment Plan: Discussed fillers to wrinkling/creases of the oral commissures. Recommend daily broad spectrum sunscreen SPF 30+ to sun-exposed areas, reapply every 2 hours as needed. Call for new or changing lesions.  Staying in the shade or wearing long sleeves, sun glasses (UVA+UVB protection) and wide brim hats (4-inch brim around the entire circumference of the hat) are also recommended for sun protection.   Acrochordons (Skin Tags) axillary area - Fleshy, skin-colored pedunculated papules - Benign appearing.  - Observe. - If desired, they can be removed with an in office procedure that is not covered by insurance. - Please call the clinic if you notice any new or changing lesions.  Return in about 1 year (around 07/07/2025) for TBSE; schedule for BBL for rosacea of the face.  LILLETTE Rosina Mayans, CMA, am acting as scribe for Alm Rhyme, MD .   Documentation: I have reviewed the above documentation for accuracy and completeness, and I agree with the above.  Alm Rhyme, MD

## 2024-07-07 NOTE — Patient Instructions (Addendum)
 Your prescription was sent to [facility] in [place]. A representative from [facility] will contact you within 3 business hours to verify your address and insurance information to schedule a free delivery. If for any reason you do not receive a phone call from them, please reach out to them. Their phone number is [phone] and their hours are Monday-Friday 9:00 am-5:00 pm.   Recommend Serica moisturizing scar formula cream every night or [facility] brand or Mederma silicone scar sheet every night for the first year after a scar appears to help with scar remodeling if desired. Scars remodel on their own for a full year and will gradually improve in appearance over time.  Due to recent changes in healthcare laws, you may see results of your pathology and/or laboratory studies on MyChart before the doctors have had a chance to review them. We understand that in some cases there may be results that are confusing or concerning to you. Please understand that not all results are received at the same time and often the doctors may need to interpret multiple results in order to provide you with the best plan of care or course of treatment. Therefore, we ask that you please give us  2 business days to thoroughly review all your results before contacting the office for clarification. Should we see a critical lab result, you will be contacted sooner.   If You Need Anything After Your Visit  If you have any questions or concerns for your doctor, please call our main line at [phone] and press option 4 to reach your doctor's medical assistant. If no one answers, please leave a voicemail as directed and we will return your call as soon as possible. Messages left after 4 pm will be answered the following business day.   You may also send us  a message via MyChart. We typically respond to MyChart messages within 1-2 business days.  For prescription refills, please ask your pharmacy to contact our  office. Our fax number is [phone].  If you have an urgent issue when the clinic is closed that cannot wait until the next business day, you can page your doctor at the number below.    Please note that while we do our best to be available for urgent issues outside of office hours, we are not available 24/7.   If you have an urgent issue and are unable to reach us , you may choose to seek medical care at your doctor's office, retail clinic, urgent care center, or emergency room.  If you have a medical emergency, please immediately call 911 or go to the emergency department.  Pager Numbers  - Dr. Hester: [phone]  - Dr. Jackquline: [phone]  - Dr. Claudene: [phone]   - Dr. Raymund: [phone]  In the event of inclement weather, please call our main line at [phone] for an update on the status of any delays or closures.  Dermatology Medication Tips: Please keep the boxes that topical medications come in in order to help keep track of the instructions about where and how to use these. Pharmacies typically print the medication instructions only on the boxes and not directly on the medication tubes.   If your medication is too expensive, please contact our office at [phone] option 4 or send us  a message through MyChart.   We are unable to tell what your co-pay for medications will be in advance as this is different depending on your insurance coverage. However, we may be able to find a substitute  medication at lower cost or fill out paperwork to get insurance to cover a needed medication.   If a prior authorization is required to get your medication covered by your insurance company, please allow us  1-2 business days to complete this process.  Drug prices often vary depending on where the prescription is filled and some pharmacies may offer cheaper prices.  The website www.goodrx.com contains coupons for medications through different pharmacies. The prices here do not  account for what the cost may be with help from insurance (it may be cheaper with your insurance), but the website can give you the price if you did not use any insurance.  - You can print the associated coupon and take it with your prescription to the pharmacy.  - You may also stop by our office during regular business hours and pick up a GoodRx coupon card.  - If you need your prescription sent electronically to a different pharmacy, notify our office through [facility] or by phone at [phone] option 4.     Si Usted Necesita Algo Despus de Su Visita  Tambin puede enviarnos un mensaje a travs de Clinical cytogeneticist. Por lo general respondemos a los mensajes de MyChart en el transcurso de 1 a 2 das hbiles.  Para renovar recetas, por favor pida a su farmacia que se ponga en contacto con nuestra oficina. Randi lakes de fax es [place] [phone].  Si tiene un asunto urgente cuando la clnica est cerrada y que no puede esperar hasta el siguiente da hbil, puede llamar/localizar a su doctor(a) al nmero que aparece a continuacin.   Por favor, tenga en cuenta que aunque hacemos todo lo posible para estar disponibles para asuntos urgentes fuera del horario de [place], no estamos disponibles las 24 horas del da, los 7 [address]  [address] de la [place].   Si tiene un problema urgente y no puede comunicarse con nosotros, puede optar por buscar atencin mdica  en el consultorio de su doctor(a), en una clnica privada, en un centro de atencin urgente o en una sala de emergencias.  Si tiene Engineer, drilling, por favor llame inmediatamente al 911 o vaya a la sala de emergencias.  Nmeros de bper  - Dr. Hester: [phone]  - Dra. Jackquline: [phone]  - Dr. Claudene: [phone]  - Dra. Kitts: [phone]  En caso de inclemencias del [place], por favor llame a nuestra lnea principal al [phone] para una actualizacin sobre el estado de cualquier retraso o cierre.  Consejos para la  medicacin en dermatologa: Por favor, guarde las cajas en las que vienen los medicamentos de uso tpico para ayudarle a seguir las instrucciones sobre dnde y cmo usarlos. Las farmacias generalmente imprimen las instrucciones del medicamento slo en las cajas y no directamente en los tubos del [place].   Si su medicamento es muy caro, por favor, pngase en contacto con landry rieger llamando al [phone] y presione la opcin 4 o envenos un mensaje a travs de Clinical cytogeneticist.   No podemos decirle cul ser su copago por los medicamentos por adelantado ya que esto es diferente dependiendo de la cobertura de su seguro. Sin embargo, es posible que podamos encontrar un medicamento sustituto a Audiological scientist un formulario para que el seguro cubra el medicamento que se considera necesario.   Si se requiere una autorizacin previa para que su compaa de seguros malta su medicamento, por favor permtanos de 1 a 2 das hbiles para completar este proceso.  Los precios de los medicamentos varan con  frecuencia dependiendo del lugar de dnde se surte la receta y alguna farmacias pueden ofrecer precios ms baratos.  El sitio web www.goodrx.com tiene cupones para medicamentos de Health and safety inspector. Los precios aqu no tienen en cuenta lo que podra costar con la ayuda del seguro (puede ser ms barato con su seguro), pero el sitio web puede darle el precio si no utiliz Tourist information centre manager.  - Puede imprimir el cupn correspondiente y llevarlo con su receta a la farmacia.  - Tambin puede pasar por nuestra oficina durante el horario de atencin regular y Education officer, museum una tarjeta de cupones de GoodRx.  - Si necesita que su receta se enve electrnicamente a una farmacia diferente, informe a nuestra oficina a travs de MyChart de [place] o por telfono llamando al [phone] y presione la opcin 4.

## 2024-07-08 ENCOUNTER — Other Ambulatory Visit: Payer: Self-pay

## 2024-07-08 MED FILL — Metoprolol Succinate Tab ER 24HR 50 MG (Tartrate Equiv): ORAL | 30 days supply | Qty: 30 | Fill #0 | Status: AC

## 2024-07-08 NOTE — Telephone Encounter (Signed)
 Requested Prescriptions  Pending Prescriptions Disp Refills   metoprolol  succinate (TOPROL -XL) 50 MG 24 hr tablet 90 tablet 1    Sig: Take 1 tablet (50 mg total) by mouth daily. Take with or immediately following a meal.     Cardiovascular:  Beta Blockers Passed - 07/08/2024  2:51 PM      Passed - Last BP in normal range    BP Readings from Last 1 Encounters:  06/20/24 128/82         Passed - Last Heart Rate in normal range    Pulse Readings from Last 1 Encounters:  06/20/24 65         Passed - Valid encounter within last 6 months    Recent Outpatient Visits           2 weeks ago Annual physical exam   [facility] at [facility], Toribio SQUIBB, [place]   2 months ago Intermittent left-sided chest pain   [facility] at [facility], Toribio SQUIBB, [place]   5 months ago Ear pain, bilateral   [facility] Primary Care & Sports Medicine at [facility], Toribio SQUIBB, [place]       Future Appointments             In 2 weeks Franchester, Cadence H, PA-C [facility] [place]   In 2 months Manya, Toribio SQUIBB, [place] [facility] Primary Care & Sports Medicine at [facility], [facility]   In 1 year Hester Alm BROCKS, MD [facility]

## 2024-07-14 ENCOUNTER — Other Ambulatory Visit: Payer: Self-pay | Admitting: Physician Assistant

## 2024-07-14 ENCOUNTER — Other Ambulatory Visit: Payer: Self-pay

## 2024-07-15 ENCOUNTER — Other Ambulatory Visit: Payer: Self-pay

## 2024-07-15 NOTE — Telephone Encounter (Signed)
 Requested medication (s) are due for refill today -yes  Requested medication (s) are on the active medication list -yes  Future visit scheduled -yes  Last refill: 05/26/23 #90 3RF  Notes to clinic: outside provider- sent for review   Requested Prescriptions  Pending Prescriptions Disp Refills   ezetimibe  (ZETIA ) 10 MG tablet 90 tablet 3    Sig: Take 1 tablet (10 mg total) by mouth daily.     Cardiovascular:  Antilipid - Sterol Transport Inhibitors Failed - 07/15/2024  3:39 PM      Failed - Lipid Panel in normal range within the last 12 months    Cholesterol, Total  Date Value Ref Range Status  06/20/2024 194 100 - 199 mg/dL Final   LDL Chol Calc (NIH)  Date Value Ref Range Status  06/20/2024 118 (H) 0 - 99 mg/dL Final   HDL  Date Value Ref Range Status  06/20/2024 60 >39 mg/dL Final   Triglycerides  Date Value Ref Range Status  06/20/2024 91 0 - 149 mg/dL Final         Passed - AST in normal range and within 360 days    AST  Date Value Ref Range Status  06/20/2024 19 0 - 40 IU/L Final   SGOT(AST)  Date Value Ref Range Status  03/10/2014 30 15 - 37 Unit/L Final         Passed - ALT in normal range and within 360 days    ALT  Date Value Ref Range Status  06/20/2024 24 0 - 32 IU/L Final   SGPT (ALT)  Date Value Ref Range Status  03/10/2014 21 12 - 78 U/L Final         Passed - Patient is not pregnant      Passed - Valid encounter within last 12 months    Recent Outpatient Visits           3 weeks ago Annual physical exam   [place] [facility] at [facility], Toribio SQUIBB, [place]   3 months ago Intermittent left-sided chest pain   [place] [facility] at [facility], Toribio SQUIBB, [place]   6 months ago Ear pain, bilateral   [facility] Primary Care & Sports Medicine at [facility], Toribio SQUIBB, [place]       Future Appointments             In 1 week Franchester, Cadence H, PA-C [facility] [place]   In 2 months Manya, Toribio SQUIBB, [place] [facility] Primary Care & Sports Medicine at [facility], [place]   In 12 months Hester Alm BROCKS, MD [facility]               Requested Prescriptions  Pending Prescriptions Disp Refills   ezetimibe  (ZETIA ) 10 MG tablet 90 tablet 3    Sig: Take 1 tablet (10 mg total) by mouth daily.     Cardiovascular:  Antilipid - Sterol Transport Inhibitors Failed - 07/15/2024  3:39 PM      Failed - Lipid Panel in normal range within the last 12 months    Cholesterol, Total  Date Value Ref Range Status  06/20/2024 194 100 - 199 mg/dL Final   LDL Chol Calc (NIH)  Date Value Ref Range Status  06/20/2024 118 (H) 0 - 99 mg/dL Final   HDL  Date Value Ref Range Status  06/20/2024 60 >39 mg/dL Final  Triglycerides  Date Value Ref Range Status  06/20/2024 91 0 - 149 mg/dL Final         Passed - AST in normal range and within 360 days    AST  Date Value Ref Range Status  06/20/2024 19 0 - 40 IU/L Final   SGOT(AST)  Date Value Ref Range Status  03/10/2014 30 15 - 37 Unit/L Final         Passed - ALT in normal range and within 360 days    ALT  Date Value Ref Range Status  06/20/2024 24 0 - 32 IU/L Final   SGPT (ALT)  Date Value Ref Range Status  03/10/2014 21 12 - 78 U/L Final         Passed - Patient is not pregnant      Passed - Valid encounter within last 12 months    Recent Outpatient Visits           3 weeks ago Annual physical exam   [facility] Medicine at [facility], Toribio SQUIBB, [place]   3 months ago Intermittent left-sided chest pain   [facility] Primary Care & Sports Medicine at [facility], Toribio SQUIBB, [place]   6 months ago Ear pain, bilateral   [facility] Primary Care & Sports Medicine at [facility], Toribio SQUIBB, [place]       Future Appointments             In 1 week Franchester, Cadence H, PA-C [place] [facility]  [place]   In 2 months Manya, Toribio SQUIBB, [place] [facility] Primary Care & Sports Medicine at [facility], [place]   In 12 months Hester Alm BROCKS, MD [facility] [place] [facility]

## 2024-07-20 ENCOUNTER — Other Ambulatory Visit: Payer: Self-pay

## 2024-07-20 ENCOUNTER — Other Ambulatory Visit: Payer: Self-pay | Admitting: Cardiovascular Disease

## 2024-07-21 ENCOUNTER — Other Ambulatory Visit: Payer: Self-pay

## 2024-07-21 MED FILL — Ezetimibe Tab 10 MG: ORAL | 30 days supply | Qty: 30 | Fill #0 | Status: AC

## 2024-07-25 ENCOUNTER — Other Ambulatory Visit: Payer: Self-pay

## 2024-07-26 ENCOUNTER — Ambulatory Visit: Attending: Medical | Admitting: Medical

## 2024-07-26 ENCOUNTER — Encounter: Payer: Self-pay | Admitting: Medical

## 2024-07-26 VITALS — BP 158/78 | HR 67 | Ht 62.0 in | Wt 128.5 lb

## 2024-07-26 DIAGNOSIS — E782 Mixed hyperlipidemia: Secondary | ICD-10-CM

## 2024-07-26 DIAGNOSIS — R072 Precordial pain: Secondary | ICD-10-CM | POA: Diagnosis not present

## 2024-07-26 DIAGNOSIS — I10 Essential (primary) hypertension: Secondary | ICD-10-CM | POA: Diagnosis not present

## 2024-07-26 MED ORDER — AMLODIPINE BESYLATE 5 MG PO TABS: 5.0000 mg | ORAL_TABLET | Freq: Every evening | ORAL | 3 refills | Status: AC

## 2024-07-26 MED ORDER — PANTOPRAZOLE SODIUM 20 MG PO TBEC: 20.0000 mg | DELAYED_RELEASE_TABLET | Freq: Every day | ORAL | 1 refills | Status: DC

## 2024-07-26 NOTE — Progress Notes (Signed)
  Cardiology Office Note   Date:  07/26/2024  ID:  Carla Green, DOB [birth year], MRN [id number] PCP: Manya Toribio SQUIBB, PA  [facility] Providers Cardiologist:  Evalene Lunger, MD    History of Present Illness Carla Green is a 63 y.o. female  with a history of palpitations, anxiety, s/p hysterectomy, s/p breast augmentation, constipation, on hormone therapy, and a coronary calcium  score of 0 who presents for follow-up.   Patient was seen 05/26/2023 for reestablishment as a new patient.  She reported muscle joint pain on Crestor .  No anginal symptoms.  She was started on Zetia  10 mg daily and metoprolol  50 mg daily.  The patient was last seen 05/05/24 reporting high BP at night but sometimes she forgets to take metoprolol . Reported improved chest pain.   Today, the patient reports high blood pressures at night, up to 180/80. She feels tense when BP is high. She did not tolerate losartan  due to runny nose. She reports persistent chest pain. It is not related to high blood pressure. Chest pain is at night while at rest. She stopped statin for weakness. She would like to try Protonix  20mg  daily  Studies Reviewed      Cardiac CTA 05/2024 IMPRESSION: 1. Minimal CAD, <25% stenosis, CADRADS 1.   2. Total plaque volume 11 mm3 which is 27th percentile for age- and sex-matched controls (calcified plaque 0 mm3; non-calcified plaque 11 mm3). TPV is mild.   3. Coronary calcium  score of 0.   4. Normal coronary origins with right dominance.   RECOMMENDATIONS: CAD-RADS 1. Minimal non-obstructive CAD (0-24%). Consider non-atherosclerotic causes of chest pain. Consider preventive therapy and risk factor modification.     Physical Exam VS:  BP (!) 158/78 (BP Location: Left Arm, Patient Position: Sitting, Cuff Size: Normal)   Pulse 67   Ht 5' 2 (1.575 m)   Wt 128 lb 8 oz (58.3 kg)   LMP 04/05/2014   SpO2 99%   BMI 23.50 kg/m        Wt Readings from Last 3 Encounters:  07/26/24 128 lb  8 oz (58.3 kg)  06/20/24 127 lb (57.6 kg)  05/05/24 129 lb 9.6 oz (58.8 kg)    GEN: Well nourished, well developed in no acute distress NECK: No JVD; No carotid bruits CARDIAC: RRR, no murmurs, rubs, gallops RESPIRATORY:  Clear to auscultation without rales, wheezing or rhonchi  ABDOMEN: Soft, non-tender, non-distended EXTREMITIES:  No edema; No deformity   ASSESSMENT AND PLAN  Chest pain Chest CTA showed no CAD, but did show less than 25% plaque in the RCA. She reports chest pain at night. She has no exertional symptoms. I will send in Protonix  20mg  daily. We dicussed ASA, but she bruises easily, so we will hold on this.   HTN BP is elevated today. At home she says it is normal in the AM and high in the PM. I will add Amlodipine  5mg  at night. Continue Toprol  50mg  daily.   HLD LDL 95. She reports weakness of pravastatin , so she stopped this. Continue Zetia , she is taking 5mg  daily.     Dispo: Follow-up in 4 months  Signed, Karanvir Balderston VEAR Fishman, PA-C

## 2024-07-26 NOTE — Patient Instructions (Signed)
 Medication Instructions:  Your physician recommends the following medication changes.  START TAKING: Amlodipine  5 mg by mouth every evening Protonix  20 mg by mouth daily    *If you need a refill on your cardiac medications before your next appointment, please call your pharmacy*  Lab Work: No labs ordered today    Testing/Procedures: No test ordered today   Follow-Up: At [facility], you and your health needs are our priority.  As part of our continuing mission to provide you with exceptional heart care, our providers are all part of one team.  This team includes your primary Cardiologist (physician) and Advanced Practice Providers or APPs (Physician Assistants and Nurse Practitioners) who all work together to provide you with the care you need, when you need it.  Your next appointment:   4 month(s)  Provider:   Timothy Gollan, MD or Cadence Franchester, PA-C

## 2024-08-15 ENCOUNTER — Other Ambulatory Visit: Payer: Self-pay | Admitting: Medical

## 2024-08-15 ENCOUNTER — Other Ambulatory Visit: Payer: Self-pay

## 2024-08-15 MED FILL — Alprazolam Tab 0.5 MG: ORAL | 30 days supply | Qty: 30 | Fill #1 | Status: AC

## 2024-08-15 MED FILL — Ezetimibe Tab 10 MG: ORAL | 30 days supply | Qty: 30 | Fill #1 | Status: AC

## 2024-08-15 MED FILL — Metoprolol Succinate Tab ER 24HR 50 MG (Tartrate Equiv): ORAL | 30 days supply | Qty: 30 | Fill #1 | Status: AC

## 2024-09-20 ENCOUNTER — Encounter: Payer: Self-pay | Admitting: Physician Assistant

## 2024-09-20 ENCOUNTER — Telehealth (INDEPENDENT_AMBULATORY_CARE_PROVIDER_SITE_OTHER): Admitting: Physician Assistant

## 2024-09-20 ENCOUNTER — Other Ambulatory Visit: Payer: Self-pay

## 2024-09-20 DIAGNOSIS — F5101 Primary insomnia: Secondary | ICD-10-CM

## 2024-09-20 DIAGNOSIS — Z79899 Other long term (current) drug therapy: Secondary | ICD-10-CM | POA: Diagnosis not present

## 2024-09-20 DIAGNOSIS — B3731 Acute candidiasis of vulva and vagina: Secondary | ICD-10-CM

## 2024-09-20 MED ORDER — ALPRAZOLAM 0.5 MG PO TABS
0.2500 mg | ORAL_TABLET | Freq: Every day | ORAL | 2 refills | Status: AC
  Filled 2024-09-20: qty 30, 30d supply, fill #0

## 2024-09-20 MED ORDER — FLUCONAZOLE 150 MG PO TABS
150.0000 mg | ORAL_TABLET | ORAL | 0 refills | Status: AC
  Filled 2024-09-20: qty 2, 3d supply, fill #0

## 2024-09-20 NOTE — Progress Notes (Signed)
 Date:  09/20/2024   Name:  Carla Green   DOB:  [birth year]   MRN:  [id number]   I connected with Carla Green on 09/20/24 via MyChart Video and verified that I am speaking with the correct person using appropriate identifiers. The limitations, risks, security and privacy concerns of performing an evaluation and management service by MyChart Video, including the higher likelihood of inaccurate diagnoses and treatments, and the availability of in person appointments were reviewed. The possible need of an additional face-to-face encounter for complete and high quality delivery of care was discussed. The patient was also made aware that there may be a patient responsible charge related to this service. The patient expressed understanding and wishes to proceed.   Provider location is in medical facility [facility] at [facility]). Patient location is in her parked vehicle.  People involved in care of the patient during this telehealth encounter were myself, my CMA, and my front office/scheduling team member.    Chief Complaint: Medical Management of Chronic Issues (Routine follow up on chronic conditions and would also like something for yeast infection as she was just on ABX for sinus issue.)  HPI   [name] presents virtually today for f/u on alprazolam  which she uses for insomnia. She does well with 0.25-0.5 mg nightly, but feels she cannot sleep without it. She does not use it during the day.   Also was recently given abx for sinus infection and thinks she now has a yeast infection, reporting vaginal itching and burning similar to prior yeast infections.   Medication list has been reviewed and updated.  Current Meds  Medication Sig   acetaminophen  (TYLENOL ) 325 MG tablet Take 325 mg by mouth every 4 (four) hours as needed.   amLODipine  (NORVASC ) 5 MG tablet Take 1 tablet (5 mg total) by mouth every evening.   Crisaborole  (EUCRISA ) 2 % OINT Apply to aa  eczema QD.   estradiol  (DOTTI ) 0.1 MG/24HR patch Place 1 patch (0.1 mg total) onto the skin 2 (two) times a week.   ezetimibe  (ZETIA ) 10 MG tablet Take 1 tablet (10 mg total) by mouth daily.   fexofenadine (ALLEGRA) 180 MG tablet Take 180 mg by mouth daily.   fluconazole (DIFLUCAN) 150 MG tablet Take 1 tablet (150 mg total) by mouth as directed for 3 days. Take 1 tablet by mouth.  If no improvement after 72 hours, you may repeat dose x1.   fluticasone  (FLONASE ) 50 MCG/ACT nasal spray Place 2 sprays into both nostrils daily as needed for allergies or rhinitis.   ibuprofen  (ADVIL ,MOTRIN ) 200 MG tablet Take 600 mg by mouth every 6 (six) hours as needed.   UNABLE TO FIND Med Name: [facility]- for GI problems   [DISCONTINUED] ALPRAZolam  (XANAX ) 0.5 MG tablet Take 0.5-1 tablets (0.25-0.5 mg total) by mouth at bedtime.     Review of Systems  Patient Active Problem List   Diagnosis Date Noted   Long-term current use of benzodiazepine 09/20/2024   Actinic keratosis 06/20/2024   Premature atrial contractions 01/11/2024   Cystitis 05/11/2020   Chronic idiopathic constipation 03/29/2020   Primary insomnia 03/29/2020   Vitamin D  deficiency 03/30/2019   Tinnitus aurium, bilateral 02/15/2019   HSV-1 infection 02/15/2019   Eustachian tube disorder, bilateral 02/15/2019   Generalized anxiety disorder 09/23/2018   Anemia 02/11/2018   Arthritis of left hip 12/09/2016   Osteopenia determined by x-ray 12/09/2016   Mixed hyperlipidemia 10/28/2016   Neuropathic pain,  leg, bilateral 07/14/2016   Environmental and seasonal allergies 07/14/2016   Intermittent left-sided chest pain 08/15/2014   Essential hypertension 08/15/2014   Irritable bowel syndrome with constipation 06/27/2014   Neuritis or radiculitis due to rupture of lumbar intervertebral disc 06/27/2014   Beat, premature ventricular 06/27/2014   Phlebectasia 06/27/2014    Allergies  Allergen Reactions   Codeine Other (See Comments) and  Itching   Codeine Sulfate Itching   Duloxetine Nausea Only    Immunization History  Administered Date(s) Administered   Influenza-Unspecified 09/18/2015, 07/27/2018, 11/25/2023   Tdap 12/19/2011    Past Surgical History:  Procedure Laterality Date   ABDOMINAL HYSTERECTOMY     BREAST ENHANCEMENT SURGERY     COLONOSCOPY     COLONOSCOPY WITH PROPOFOL  N/A 01/11/2016   Procedure: COLONOSCOPY WITH PROPOFOL ;  Surgeon: Deward CINDERELLA Piedmont, MD;  Location: [facility] ENDOSCOPY;  Service: Gastroenterology;  Laterality: N/A;   ESOPHAGOGASTRODUODENOSCOPY  2013   normal   ESOPHAGOGASTRODUODENOSCOPY (EGD) WITH PROPOFOL  N/A 01/11/2016   Procedure: ESOPHAGOGASTRODUODENOSCOPY (EGD) WITH PROPOFOL ;  Surgeon: Deward CINDERELLA Piedmont, MD;  Location: [facility] ENDOSCOPY;  Service: Gastroenterology;  Laterality: N/A;   eye brow surgery     HERNIA REPAIR     femoral   HYSTEROSCOPY WITH D & C N/A 05/07/2015   Procedure: DILATATION AND CURETTAGE /HYSTEROSCOPY;  Surgeon: Heather Penton, MD;  Location: [facility] ORS;  Service: Gynecology;  Laterality: N/A;   LAPAROSCOPIC TOTAL HYSTERECTOMY     leg vein     TONSILLECTOMY     VARICOSE VEIN SURGERY     laser    Social History   Tobacco Use   Smoking status: Never   Smokeless tobacco: Never  Vaping Use   Vaping status: Never Used  Substance Use Topics   Alcohol use: No    Alcohol/week: 0.0 standard drinks of alcohol   Drug use: No    Family History  Problem Relation Age of Onset   Diabetes Father    Cancer Father    Heart attack Father        24   Heart disease Father        CABG   Colon cancer Father    Heart attack Paternal Uncle 63   Heart disease Paternal Uncle        CABG x 4    Heart attack Paternal Grandmother 64   Breast cancer Neg Hx         06/20/2024   10:07 AM 04/13/2024    1:28 PM 01/11/2024   10:12 AM 09/23/2018   10:50 AM  GAD 7 : Generalized Anxiety Score  Nervous, Anxious, on Edge 0 0 0 0  Control/stop worrying 0 0 0 0  Worry too much - different things  0 0 0 0  Trouble relaxing 0 0 0 0  Restless 0 0 0 0  Easily annoyed or irritable 0 0 0 0  Afraid - awful might happen 0 0 0 0  Total GAD 7 Score 0 0 0 0  Anxiety Difficulty Not difficult at all Not difficult at all Not difficult at all        09/20/2024   10:40 AM 06/20/2024   10:07 AM 04/13/2024    1:28 PM  Depression screen PHQ 2/9  Decreased Interest 0 0 0  Down, Depressed, Hopeless 0 0 0  PHQ - 2 Score 0 0 0    BP Readings from Last 3 Encounters:  07/26/24 (!) 158/78  06/20/24 128/82  06/06/24 (!) 160/79  Wt Readings from Last 3 Encounters:  07/26/24 128 lb 8 oz (58.3 kg)  06/20/24 127 lb (57.6 kg)  05/05/24 129 lb 9.6 oz (58.8 kg)    LMP 04/05/2014   Physical Exam General: Speaking full sentences, no audible heavy breathing. Sounds alert and appropriately interactive. Well-appearing. Face symmetric. Extraocular movements intact. Pupils equal and round. No nasal flaring or accessory muscle use visualized.  Recent Labs     Component Value Date/Time   NA 140 06/20/2024 1050   NA 139 03/10/2014 1657   K 4.2 06/20/2024 1050   K 3.9 03/10/2014 1657   CL 102 06/20/2024 1050   CL 106 03/10/2014 1657   CO2 22 06/20/2024 1050   CO2 26 03/10/2014 1657   GLUCOSE 90 06/20/2024 1050   GLUCOSE 107 (H) 07/31/2015 0509   GLUCOSE 101 (H) 03/10/2014 1657   BUN 13 06/20/2024 1050   BUN 13 03/10/2014 1657   CREATININE 0.75 06/20/2024 1050   CREATININE 0.65 03/10/2014 1657   CALCIUM  9.2 06/20/2024 1050   CALCIUM  8.6 03/10/2014 1657   PROT 6.7 06/20/2024 1050   PROT 6.9 03/10/2014 1657   ALBUMIN 4.6 06/20/2024 1050   ALBUMIN 3.5 03/10/2014 1657   AST 19 06/20/2024 1050   AST 30 03/10/2014 1657   ALT 24 06/20/2024 1050   ALT 21 03/10/2014 1657   ALKPHOS 53 06/20/2024 1050   ALKPHOS 44 (L) 03/10/2014 1657   BILITOT 0.7 06/20/2024 1050   BILITOT 0.3 03/10/2014 1657   GFRNONAA 98 03/29/2020 1033   GFRNONAA >60 03/10/2014 1657   GFRAA 114 03/29/2020 1033   GFRAA >60  03/10/2014 1657    Lab Results  Component Value Date   WBC 7.0 06/20/2024   HGB 14.0 06/20/2024   HCT 42.3 06/20/2024   MCV 95 06/20/2024   PLT 248 06/20/2024   Lab Results  Component Value Date   HGBA1C 5.4 03/29/2019   Lab Results  Component Value Date   CHOL 194 06/20/2024   HDL 60 06/20/2024   LDLCALC 118 (H) 06/20/2024   TRIG 91 06/20/2024   CHOLHDL 3.2 06/20/2024   Lab Results  Component Value Date   TSH 0.995 06/20/2024     Assessment and Plan:  Primary insomnia Assessment & Plan: For now will continue alprazolam  but patient advised will want to address the underlying issue to avoid indefinite use of this medication.  Reviewed common practices to improve sleep hygiene including consistent sleep schedule, cool sleeping temperatures, keeping bedroom as dark as possible, daytime physical activity (especially resistance training), avoiding harmful bedtime habits (e.g. TV, phone use, etc),  avoiding oral intake within 1 hour of bed, avoiding evening caffeine/alcohol consumption.  Consider mindfulness exercises for sleep e.g. Calm app, yoga, etc.  Orders: -     ALPRAZolam ; Take 0.5-1 tablets (0.25-0.5 mg total) by mouth at bedtime.  Dispense: 60 tablet; Refill: 2  Long-term current use of benzodiazepine  Vaginal candidiasis -     Fluconazole; Take 1 tablet (150 mg total) by mouth as directed for 3 days. Take 1 tablet by mouth.  If no improvement after 72 hours, you may repeat dose x1.  Dispense: 2 tablet; Refill: 0    F/u in 3 mo in-person OV insomnia w/ UDS. Patient to schedule.    I discussed the above assessment and treatment plan with the patient. The patient was provided an opportunity to ask questions and all were answered. The patient agreed with the plan and demonstrated an understanding of the instructions. The  patient was advised to call back or seek an in-person evaluation if the symptoms worsen or if the condition fails to improve as anticipated. I  provided a total time of 15 minutes inclusive of time utilized for medical chart review, information gathering, care coordination with staff, and documentation completion.  Rolan Hoyle, PA-C, DMSc, Nutritionist [facility] [phone]

## 2024-09-20 NOTE — Assessment & Plan Note (Signed)
 For now will continue alprazolam  but patient advised will want to address the underlying issue to avoid indefinite use of this medication.  Reviewed common practices to improve sleep hygiene including consistent sleep schedule, cool sleeping temperatures, keeping bedroom as dark as possible, daytime physical activity (especially resistance training), avoiding harmful bedtime habits (e.g. TV, phone use, etc),  avoiding oral intake within 1 hour of bed, avoiding evening caffeine/alcohol consumption.  Consider mindfulness exercises for sleep e.g. Calm app, yoga, etc.

## 2024-09-26 ENCOUNTER — Other Ambulatory Visit: Payer: Self-pay

## 2024-10-26 ENCOUNTER — Other Ambulatory Visit: Payer: Self-pay

## 2024-10-28 ENCOUNTER — Other Ambulatory Visit: Payer: Self-pay

## 2024-12-15 ENCOUNTER — Ambulatory Visit: Payer: Self-pay | Admitting: Physician Assistant

## 2024-12-16 ENCOUNTER — Other Ambulatory Visit: Payer: Self-pay | Admitting: Physician Assistant

## 2024-12-16 DIAGNOSIS — F418 Other specified anxiety disorders: Secondary | ICD-10-CM

## 2024-12-16 MED ORDER — DIAZEPAM 5 MG PO TABS: 5.0000 mg | ORAL_TABLET | Freq: Two times a day (BID) | ORAL | 0 refills | Status: AC | PRN

## 2025-06-21 ENCOUNTER — Encounter: Admitting: Physician Assistant

## 2025-07-13 ENCOUNTER — Ambulatory Visit: Admitting: Dermatology
